# Patient Record
Sex: Male | Born: 1950 | Race: Black or African American | Hispanic: No | Marital: Married | Smoking: Former smoker
Health system: Southern US, Community
[De-identification: ages and names within clinical notes are randomized; demographics above are authoritative.]

## PROBLEM LIST (undated history)

## (undated) DIAGNOSIS — K635 Polyp of colon: Secondary | ICD-10-CM

## (undated) DIAGNOSIS — J449 Chronic obstructive pulmonary disease, unspecified: Secondary | ICD-10-CM

## (undated) DIAGNOSIS — G5600 Carpal tunnel syndrome, unspecified upper limb: Secondary | ICD-10-CM

## (undated) DIAGNOSIS — I10 Essential (primary) hypertension: Secondary | ICD-10-CM

## (undated) DIAGNOSIS — D649 Anemia, unspecified: Secondary | ICD-10-CM

## (undated) DIAGNOSIS — E785 Hyperlipidemia, unspecified: Secondary | ICD-10-CM

## (undated) DIAGNOSIS — G8929 Other chronic pain: Secondary | ICD-10-CM

## (undated) DIAGNOSIS — E119 Type 2 diabetes mellitus without complications: Secondary | ICD-10-CM

## (undated) DIAGNOSIS — M549 Dorsalgia, unspecified: Secondary | ICD-10-CM

## (undated) HISTORY — DX: Carpal tunnel syndrome, unspecified upper limb: G56.00

## (undated) HISTORY — DX: Other chronic pain: G89.29

## (undated) HISTORY — DX: Essential (primary) hypertension: I10

## (undated) HISTORY — DX: Type 2 diabetes mellitus without complications: E11.9

## (undated) HISTORY — DX: Hyperlipidemia, unspecified: E78.5

## (undated) HISTORY — DX: Anemia, unspecified: D64.9

## (undated) HISTORY — DX: Dorsalgia, unspecified: M54.9

## (undated) HISTORY — DX: Chronic obstructive pulmonary disease, unspecified: J44.9

## (undated) HISTORY — PX: ABDOMINAL HERNIA REPAIR: SHX539

---

## 1999-08-27 ENCOUNTER — Ambulatory Visit (HOSPITAL_BASED_OUTPATIENT_CLINIC_OR_DEPARTMENT_OTHER): Admission: RE | Admit: 1999-08-27 | Discharge: 1999-08-27 | Payer: Self-pay | Admitting: Dentistry

## 2000-10-21 ENCOUNTER — Encounter: Admission: RE | Admit: 2000-10-21 | Discharge: 2000-10-21 | Payer: Self-pay | Admitting: Emergency Medicine

## 2001-06-09 ENCOUNTER — Encounter: Payer: Self-pay | Admitting: Internal Medicine

## 2001-06-09 ENCOUNTER — Ambulatory Visit (HOSPITAL_COMMUNITY): Admission: RE | Admit: 2001-06-09 | Discharge: 2001-06-09 | Payer: Self-pay | Admitting: Internal Medicine

## 2002-07-29 ENCOUNTER — Encounter: Payer: Self-pay | Admitting: Emergency Medicine

## 2002-07-29 ENCOUNTER — Emergency Department (HOSPITAL_COMMUNITY): Admission: EM | Admit: 2002-07-29 | Discharge: 2002-07-29 | Payer: Self-pay | Admitting: Emergency Medicine

## 2002-09-06 ENCOUNTER — Emergency Department (HOSPITAL_COMMUNITY): Admission: EM | Admit: 2002-09-06 | Discharge: 2002-09-06 | Payer: Self-pay | Admitting: Emergency Medicine

## 2002-09-06 ENCOUNTER — Encounter: Payer: Self-pay | Admitting: Emergency Medicine

## 2006-01-05 ENCOUNTER — Ambulatory Visit (HOSPITAL_COMMUNITY): Admission: RE | Admit: 2006-01-05 | Discharge: 2006-01-05 | Payer: Self-pay | Admitting: Family Medicine

## 2006-11-28 ENCOUNTER — Ambulatory Visit (HOSPITAL_COMMUNITY): Admission: RE | Admit: 2006-11-28 | Discharge: 2006-11-28 | Payer: Self-pay | Admitting: Family Medicine

## 2007-11-17 ENCOUNTER — Encounter (HOSPITAL_COMMUNITY): Admission: RE | Admit: 2007-11-17 | Discharge: 2007-12-13 | Payer: Self-pay | Admitting: Neurosurgery

## 2009-01-10 ENCOUNTER — Ambulatory Visit: Payer: Self-pay | Admitting: Internal Medicine

## 2009-01-21 ENCOUNTER — Ambulatory Visit: Payer: Self-pay | Admitting: Internal Medicine

## 2009-01-24 ENCOUNTER — Encounter: Payer: Self-pay | Admitting: Internal Medicine

## 2009-01-24 ENCOUNTER — Ambulatory Visit: Payer: Self-pay | Admitting: Internal Medicine

## 2009-01-24 ENCOUNTER — Ambulatory Visit (HOSPITAL_COMMUNITY): Admission: RE | Admit: 2009-01-24 | Discharge: 2009-01-24 | Payer: Self-pay | Admitting: Internal Medicine

## 2009-01-24 HISTORY — PX: COLONOSCOPY: SHX174

## 2011-01-03 ENCOUNTER — Encounter: Payer: Self-pay | Admitting: Family Medicine

## 2011-03-30 LAB — GLUCOSE, CAPILLARY: Glucose-Capillary: 137 mg/dL — ABNORMAL HIGH (ref 70–99)

## 2011-04-27 NOTE — Consult Note (Signed)
NAME:  Patrick Taylor, Patrick Taylor NO.:  [id number]   MEDICAL RECORD NO.:  [id number]         PATIENT TYPE:  PAMB   LOCATION:  DAY                           FACILITY:  [facility]   PHYSICIAN:  R. Roetta Sessions, M.D. DATE OF BIRTH:  [birth year]   DATE OF CONSULTATION:  DATE OF DISCHARGE:                                 CONSULTATION   PRIMARY CARE PHYSICIAN:  Kirk Ruths, MD   CHIEF COMPLAINT:  Chronic anemia.  He needs colonoscopy.   HISTORY OF PRESENT ILLNESS:  Patrick Taylor is a 60 year old African  American male who is referred through the courtesy of Dr. Geanie Logan  office.  He tells me that he has never had a colonoscopy before he  denies any rectal bleeding or melena.  He has history of arthritis and  has been taking diclofenac for about 1 year.  He has also been on  aspirin 81 mg daily.  He denies any abdominal pain, nausea, vomiting,  heartburn, ingestion, anorexia, or early satiety.  His weight has  remained stable.  He has been on ranitidine since 1995, but denies any  heartburn, indigestion, or reflex history.   He is having a soft brown bowel movement daily.   Laboratory studies from Dr. Geanie Logan office show a white blood cell  count of 5.6, hemoglobin 12.2, hematocrit 38.9, MCV 83.5, and platelet  count 333.  He has a 18% peripheral eosinophilia.  He has a normal CMP  except for glucose of 107.   PAST MEDICAL HISTORY:  Diabetes mellitus, chronic back pain with  neuropathy.  He has carpal tunnel syndrome, chronic anemia,  hypertension, asthma, hyperlipidemia, arthritis, chronic peripheral  eosinophilia and he has had surgery on abdominal hernia repair.   CURRENT MEDICATIONS:  1. Gabapentin 300 mg in the morning and 600 mg in the evening.  2. Hydrocodone 7.5/650 mg b.i.d.  3. Ranitidine 150 mg b.i.d.  4. Aspirin 81 mg daily.  5. Enalapril 5 mg daily.  6. Furosemide 20 mg daily.  7. Diclofenac 75 mg b.i.d.  8. Simvastatin 20 mg daily.  9. Glyburide 4 mg  b.i.d.  10.ACTOplus metformin 15/850 mg b.i.d.  11.Ventolin b.i.d.  12.Symbicort b.i.d.   ALLERGIES:  No known drug allergies.   FAMILY HISTORY:  Mother deceased secondary to alcoholic cirrhosis.  Father deceased, etiology unknown.   SOCIAL HISTORY:  Patrick Taylor is married.  He has been married for 31  years.  He is employed with [employer] in [place].  He quit smoking  about 10 years ago.  He quit drinking alcohol 9 years ago.  He has never  been a heavy drinker.  He denies any drug use.   REVIEW OF SYSTEMS:  See HPI, otherwise negative.   PHYSICAL EXAMINATION:  VITAL SIGNS:  Weight 235.5 pounds, height 62  inches, temperature 98.6, blood pressure 140/80, and pulse 56.  GENERAL:  He is an obese African male who is alert, oriented, pleasant,  cooperative in no acute distress.  Today he is accompanied by his wife.  HEENT:  Sclerae clear, nonicteric. Conjunctivae pink. Oropharynx pink  and moist without any lesions.  NECK:  Supple without mass or thyromegaly.  CHEST:  Heart regular rate and rhythm.  Normal S2 and S2.  No murmurs,  clicks, rubs or gallops.  LUNGS:  Clear to auscultation bilaterally.  ABDOMEN:  Protuberant with positive bowel sounds x4.  No bruits were  auscultated.  Soft, nontender, and nondistended without palpable mass or  thyromegaly.  There is no rebound tenderness or guarding.  EXTREMITIES:  Without clubbing or edema.   IMPRESSION:  Patrick Taylor is a 60 year old African American male with  history of chronic anemia.  He has never had a colonoscopy.  He has been  on H2 blocker, but denies any history of gastroesophageal reflux  disease.  His Hemoccult status is unknown at this time.  He also has a  peripheral eosinophilia.  He is going to need to be further evaluated  for occult malignancy including colorectal carcinoma.  He is on  nonsteroidal antiinflammatory drugs, placing him at risk for peptic  ulcer disease, or bleeding throughout his gastrointestinal  tract  including small bowel arteriovenous malformations.   PLAN:  1. We will check Hemoccult status x3.  If it is positive, he will need      EGD at the time of colonoscopy, if nothing is found to account his      anemia.  2. Colonoscopy plus/minus EGD with Dr. Jena Gauss in the near future.  I      have discussed these procedures including risks and benefits which      include but not limited to bleeding, infection, perforation, or      drug reaction.  He agrees      with this plan.  The consent was obtained.  He is going to take      half of his diabetes medications a day prior to end of the      procedure.   Thank you Dr. Nobie Putnam for allowing [place] to participate in the care of [name].       Lorenza Burton, N.P.      Jonathon Bellows, M.D.  Electronically Signed    KJ/MEDQ  D:  01/10/2009  T:  01/11/2009  Job:  [id number]   cc:   Patrica Duel, M.D.  Fax: [phone]

## 2011-04-27 NOTE — Op Note (Signed)
NAME:  DEMETRES, PROCHNOW NO.:  [id number]   MEDICAL RECORD NO.:  [id number]          PATIENT TYPE:  AMB   LOCATION:  DAY                           FACILITY:  [facility]   PHYSICIAN:  R. Roetta Sessions, M.D. DATE OF BIRTH:  1951/04/13   DATE OF PROCEDURE:  01/24/2009  DATE OF DISCHARGE:                               OPERATIVE REPORT   PROCEDURE:  Colonoscopy and snare polypectomy.   INDICATIONS FOR PROCEDURE:  A 60 year old gentleman with history of  chronic anemia, Hemoccult-negative times three through our office  recently.  He is devoid of any lower GI tract symptoms.  There is no  family history of colorectal neoplasia or polyps.  He has never had his  lower GI tract evaluated.  Colonoscopy is now being done.  Risks,  benefits, alternatives and limitations have been discussed and questions  answered.  Please see the documentation in the medical record.   PROCEDURE NOTE:  Oxygen saturation, blood pressure, pulse and  respirations were monitored throughout the entire procedure.  Conscious  sedation with Versed 4 mg IV, Demerol 100 mg IV in divided doses.   INSTRUMENT:  Pentax video chip system.   FINDINGS:  Digital rectal exam revealed no abnormalities.  Prep was  adequate. Colonic mucosal survey from the rectosigmoid junction to the  left transverse, right colon, to the appendiceal orifice, ileocecal  valve and cecum.  These structures were well-seen and photographed for  the record.  From this level the scope was slowly withdrawn.  All  previously-mentioned mucosal surfaces were again seen.  The patient had  a 6-mm sessile polyp in the descending colon, which was hot-snared,  removed, recovered through the scope.  The remainder of colonic mucosa  appeared normal.  Scope was pulled out of the rectum.  Thorough  examination of the rectal mucosa, including retroflexed view of the anal  verge, demonstrated a 1-cm flat, oval polyp, 5 cm in from the anal  verge.  This was  hot-snare removed and recovered through the scope.  The  remainder of the rectal mucosa appeared unremarkable.  The patient  tolerated the procedure well.   Esophagogastroduodenoscopy was not performed as currently there is no  indication.   IMPRESSION:  1. Rectal polyp, status post snare polypectomy.  2. Mid-descending colon, status post hot-snare removal.  3. Remainder of colonic mucosa appeared normal.   RECOMMENDATIONS:  1. No aspirin or _NSAIDS_ medications for the next five days.  2. Follow up on pathology.  3. Further recommendations to follow.      Jonathon Bellows, M.D.  Electronically Signed     RMR/MEDQ  D:  01/24/2009  T:  01/24/2009  Job:  [id number]   cc:   Patrica Duel, M.D.  Fax: [phone]

## 2012-01-13 ENCOUNTER — Encounter: Payer: Self-pay | Admitting: Internal Medicine

## 2012-02-17 ENCOUNTER — Encounter: Payer: Self-pay | Admitting: Internal Medicine

## 2012-02-18 ENCOUNTER — Encounter: Payer: Self-pay | Admitting: Gastroenterology

## 2012-02-18 ENCOUNTER — Ambulatory Visit (INDEPENDENT_AMBULATORY_CARE_PROVIDER_SITE_OTHER): Payer: PRIVATE HEALTH INSURANCE | Admitting: Gastroenterology

## 2012-02-18 VITALS — BP 148/84 | HR 84 | Temp 97.5°F | Ht 62.0 in | Wt 233.4 lb

## 2012-02-18 DIAGNOSIS — D126 Benign neoplasm of colon, unspecified: Secondary | ICD-10-CM

## 2012-02-18 DIAGNOSIS — D649 Anemia, unspecified: Secondary | ICD-10-CM | POA: Insufficient documentation

## 2012-02-18 MED ORDER — PEG-KCL-NACL-NASULF-NA ASC-C 100 G PO SOLR: 1.0000 | Freq: Once | ORAL | Status: DC

## 2012-02-18 NOTE — Progress Notes (Signed)
Primary Care Physician:  MCGOUGH,Hovanes M, MD, MD  Primary Gastroenterologist:  Michael Rourk, MD   Chief Complaint  Patient presents with  . Colonoscopy    HPI:  Patrick Taylor is a 61 y.o. 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 He denies any issues with his bowels. Generally has 2 BMs daily. No blood in stool or melena. Denies any abdominal pain, unintentional weight loss, vomiting, heartburn, dysphagia. He denies any anemia. Previously at time of last colonoscopy he did have issues with anemia. At that time, January 2002, his hemoglobin was 12.2, MCV 83.5, eosinophil count 18%, reportedly chronic.  Current Outpatient Prescriptions  Medication Sig Dispense Refill  . albuterol (PROVENTIL) 2 MG tablet Take 2 mg by mouth 3 (three) times daily.      . aspirin 81 MG tablet Take 81 mg by mouth daily.      . budesonide-formoterol (SYMBICORT) 80-4.5 MCG/ACT inhaler Inhale 2 puffs into the lungs 2 (two) times daily.      . cyclobenzaprine (FLEXERIL) 10 MG tablet Take by mouth 3 (three) times daily as needed.       . enalapril (VASOTEC) 5 MG tablet Take 5 mg by mouth daily.       . furosemide (LASIX) 20 MG tablet Take 20 mg by mouth daily.       . gabapentin (NEURONTIN) 300 MG capsule Take 300 mg by mouth 3 (three) times daily. 1-2 pills tid      . HYDROcodone-acetaminophen (NORCO) 7.5-325 MG per tablet Take 1 tablet by mouth every 6 (six) hours as needed.      . metFORMIN (GLUCOPHAGE-XR) 500 MG 24 hr tablet Take 1,000 mg by mouth 2 (two) times daily.       . mometasone-formoterol (DULERA) 100-5 MCG/ACT AERO Inhale 2 puffs into the lungs 2 (two) times daily.      . nabumetone (RELAFEN) 750 MG tablet Take 750 mg by mouth 2 (two) times daily.      . NOVOLOG MIX 70/30 FLEXPEN (70-30) 100 UNIT/ML injection Inject 15 Units into the skin. 15 in am and 40 at night as needed      . simvastatin (ZOCOR) 20 MG tablet 20 mg at  bedtime.         Allergies as of 02/18/2012  . (No Known Allergies)    Past Medical History  Diagnosis Date  . DM (diabetes mellitus)   . Carpal tunnel syndrome   . Anemia   . HTN (hypertension)   . Asthma   . Hyperlipidemia   . Chronic back pain     Past Surgical History  Procedure Date  . Colonoscopy 01/24/09    rectal polyp/adenomatous poylps/tubulovillous  . Abdominal hernia repair    Family History  Problem Relation Age of Onset  . Colon cancer       History   Social History  . Marital Status: Married    Spouse Name: N/A    Number of Children: N/A  . Years of Education: N/A    Works at [employer].       Social History Main Topics  . Smoking status: Never Smoker   . Smokeless tobacco: Not on file  . Alcohol Use: No  . Drug Use: No  . Sexually Active: Not on file   Other Topics Concern  . Not on file   Social History Narrative  . No narrative on file      ROS:  General: Negative   for anorexia, weight loss, fever, chills, fatigue, weakness. Eyes: Negative for vision changes.  ENT: Negative for hoarseness, difficulty swallowing , nasal congestion. CV: Negative for chest pain, angina, palpitations, dyspnea on exertion, peripheral edema.  Respiratory: Negative for dyspnea at rest, dyspnea on exertion, cough, sputum, wheezing.  GI: See history of present illness. GU:  Negative for dysuria, hematuria, urinary incontinence, urinary frequency, nocturnal urination.  MS: Chronic knee and back pain.  Derm: Negative for rash or itching.  Neuro: Negative for weakness, abnormal sensation, seizure, frequent headaches, memory loss, confusion.  Psych: Negative for anxiety, depression, suicidal ideation, hallucinations.  Endo: Negative for unusual weight change.  Heme: Negative for bruising or bleeding. Allergy: Negative for rash or hives.    Physical Examination:  BP 148/84  Pulse 84  Temp(Src) 97.5 F (36.4 C) (Temporal)  Ht 5' 2" (1.575 m)  Wt 233 lb  6.4 oz (105.87 kg)  BMI 42.69 kg/m2   General: Well-nourished, well-developed in no acute distress. Obese. Head: Normocephalic, atraumatic.   Eyes: Conjunctiva pink, no icterus. Mouth: Oropharyngeal mucosa moist and pink , no lesions erythema or exudate. Neck: Supple without thyromegaly, masses, or lymphadenopathy.  Lungs: Clear to auscultation bilaterally.  Heart: Regular rate and rhythm, no murmurs rubs or gallops.  Abdomen: Bowel sounds are normal, nontender, nondistended, no hepatosplenomegaly or masses, no abdominal bruits or    hernia , no rebound or guarding.   Rectal: Defer to time of colonoscopy. Extremities: No lower extremity edema. No clubbing or deformities.  Neuro: Alert and oriented x 4 , grossly normal neurologically.  Skin: Warm and dry, no rash or jaundice.   Psych: Alert and cooperative, normal mood and affect.     

## 2012-02-18 NOTE — Assessment & Plan Note (Signed)
H/O anemia. Retrieve latest labs from PCP.

## 2012-02-18 NOTE — Assessment & Plan Note (Signed)
Due for surveillance colonoscopy for history of advanced polyp.  I have discussed the risks, alternatives, benefits with regards to but not limited to the risk of reaction to medication, bleeding, infection, perforation and the patient is agreeable to proceed. Written consent to be obtained.

## 2012-02-18 NOTE — Progress Notes (Signed)
Faxed to PCP

## 2012-02-21 NOTE — Progress Notes (Signed)
Please let patient know, the last labs available from Dr. Edison Simon office 12/2009.  Consider CBC if patient agreeable.

## 2012-02-23 ENCOUNTER — Other Ambulatory Visit: Payer: Self-pay | Admitting: Gastroenterology

## 2012-02-23 DIAGNOSIS — D649 Anemia, unspecified: Secondary | ICD-10-CM

## 2012-02-23 NOTE — Progress Notes (Signed)
Tried to call pt- number disconnected. Letter mailed

## 2012-02-26 ENCOUNTER — Other Ambulatory Visit: Payer: Self-pay | Admitting: Gastroenterology

## 2012-02-27 LAB — CBC WITH DIFFERENTIAL/PLATELET
Basophils Absolute: 0 10*3/uL (ref 0.0–0.1)
Eosinophils Relative: 8 % — ABNORMAL HIGH (ref 0–5)
Lymphocytes Relative: 41 % (ref 12–46)
Lymphs Abs: 2.4 10*3/uL (ref 0.7–4.0)
Neutro Abs: 2.4 10*3/uL (ref 1.7–7.7)
Neutrophils Relative %: 42 % — ABNORMAL LOW (ref 43–77)
Platelets: 324 10*3/uL (ref 150–400)
RBC: 4.92 MIL/uL (ref 4.22–5.81)
RDW: 14 % (ref 11.5–15.5)
WBC: 5.8 10*3/uL (ref 4.0–10.5)

## 2012-02-29 NOTE — Progress Notes (Signed)
Quick Note:  CBC now with low normal Hgb. EOS chronically elevated but much better. TCS as planned. ______

## 2012-02-29 NOTE — Progress Notes (Signed)
Quick Note:  Tried to call pt- NA and voicemail has not been set up yet. ______

## 2012-03-01 MED ORDER — SODIUM CHLORIDE 0.45 % IV SOLN
Freq: Once | INTRAVENOUS | Status: AC
  Administered 2012-03-02: 10:00:00 via INTRAVENOUS

## 2012-03-02 ENCOUNTER — Ambulatory Visit (HOSPITAL_COMMUNITY)
Admission: RE | Admit: 2012-03-02 | Discharge: 2012-03-02 | Disposition: A | Discharge status: Home Health | Payer: PRIVATE HEALTH INSURANCE | Source: Ambulatory Visit | Attending: Internal Medicine | Admitting: Internal Medicine

## 2012-03-02 ENCOUNTER — Encounter (HOSPITAL_COMMUNITY): Payer: Self-pay | Admitting: *Deleted

## 2012-03-02 ENCOUNTER — Encounter (HOSPITAL_COMMUNITY)
Admission: RE | Disposition: A | Discharge status: Home Health | Payer: Self-pay | Source: Ambulatory Visit | Attending: Internal Medicine

## 2012-03-02 DIAGNOSIS — Z794 Long term (current) use of insulin: Secondary | ICD-10-CM | POA: Insufficient documentation

## 2012-03-02 DIAGNOSIS — K5730 Diverticulosis of large intestine without perforation or abscess without bleeding: Secondary | ICD-10-CM

## 2012-03-02 DIAGNOSIS — D126 Benign neoplasm of colon, unspecified: Secondary | ICD-10-CM | POA: Insufficient documentation

## 2012-03-02 DIAGNOSIS — E785 Hyperlipidemia, unspecified: Secondary | ICD-10-CM | POA: Insufficient documentation

## 2012-03-02 DIAGNOSIS — Z860100 Personal history of colon polyps, unspecified: Secondary | ICD-10-CM | POA: Insufficient documentation

## 2012-03-02 DIAGNOSIS — E119 Type 2 diabetes mellitus without complications: Secondary | ICD-10-CM | POA: Insufficient documentation

## 2012-03-02 DIAGNOSIS — D649 Anemia, unspecified: Secondary | ICD-10-CM

## 2012-03-02 DIAGNOSIS — I10 Essential (primary) hypertension: Secondary | ICD-10-CM | POA: Insufficient documentation

## 2012-03-02 DIAGNOSIS — Z01812 Encounter for preprocedural laboratory examination: Secondary | ICD-10-CM | POA: Insufficient documentation

## 2012-03-02 DIAGNOSIS — Z1211 Encounter for screening for malignant neoplasm of colon: Secondary | ICD-10-CM

## 2012-03-02 DIAGNOSIS — Z79899 Other long term (current) drug therapy: Secondary | ICD-10-CM | POA: Insufficient documentation

## 2012-03-02 HISTORY — DX: Polyp of colon: K63.5

## 2012-03-02 HISTORY — PX: COLONOSCOPY: SHX5424

## 2012-03-02 LAB — GLUCOSE, CAPILLARY

## 2012-03-02 SURGERY — COLONOSCOPY
Anesthesia: Moderate Sedation

## 2012-03-02 MED ORDER — MIDAZOLAM HCL 5 MG/5ML IJ SOLN
INTRAMUSCULAR | Status: DC | PRN
  Administered 2012-03-02 (×2): 2 mg via INTRAVENOUS

## 2012-03-02 MED ORDER — MIDAZOLAM HCL 5 MG/5ML IJ SOLN
INTRAMUSCULAR | Status: AC
  Filled 2012-03-02: qty 10

## 2012-03-02 MED ORDER — STERILE WATER FOR IRRIGATION IR SOLN
Status: DC | PRN
  Administered 2012-03-02: 11:00:00

## 2012-03-02 MED ORDER — MEPERIDINE HCL 100 MG/ML IJ SOLN
INTRAMUSCULAR | Status: AC
  Filled 2012-03-02: qty 2

## 2012-03-02 MED ORDER — MEPERIDINE HCL 100 MG/ML IJ SOLN
INTRAMUSCULAR | Status: DC | PRN
  Administered 2012-03-02: 50 mg via INTRAVENOUS

## 2012-03-02 NOTE — Discharge Instructions (Addendum)
Colonoscopy Discharge Instructions  Read the instructions outlined below and refer to this sheet in the next few weeks. These discharge instructions provide you with general information on caring for yourself after you leave the hospital. Your doctor may also give you specific instructions. While your treatment has been planned according to the most current medical practices available, unavoidable complications occasionally occur. If you have any problems or questions after discharge, call Dr. Jena Gauss at [phone]. ACTIVITY  You may resume your regular activity, but move at a slower pace for the next 24 hours.   Take frequent rest periods for the next 24 hours.   Walking will help get rid of the air and reduce the bloated feeling in your belly (abdomen).   No driving for 24 hours (because of the medicine (anesthesia) used during the test).    Do not sign any important legal documents or operate any machinery for 24 hours (because of the anesthesia used during the test).  NUTRITION  Drink plenty of fluids.   You may resume your normal diet as instructed by your doctor.   Begin with a light meal and progress to your normal diet. Heavy or fried foods are harder to digest and may make you feel sick to your stomach (nauseated).   Avoid alcoholic beverages for 24 hours or as instructed.  MEDICATIONS  You may resume your normal medications unless your doctor tells you otherwise.  WHAT YOU CAN EXPECT TODAY  Some feelings of bloating in the abdomen.   Passage of more gas than usual.   Spotting of blood in your stool or on the toilet paper.  IF YOU HAD POLYPS REMOVED DURING THE COLONOSCOPY:  No aspirin products for 7 days or as instructed.   No alcohol for 7 days or as instructed.   Eat a soft diet for the next 24 hours.  FINDING OUT THE RESULTS OF YOUR TEST Not all test results are available during your visit. If your test results are not back during the visit, make an appointment  with your caregiver to find out the results. Do not assume everything is normal if you have not heard from your caregiver or the medical facility. It is important for you to follow up on all of your test results.  SEEK IMMEDIATE MEDICAL ATTENTION IF:  You have more than a spotting of blood in your stool.   Your belly is swollen (abdominal distention).   You are nauseated or vomiting.   You have a temperature over 101.   You have abdominal pain or discomfort that is severe or gets worse throughout the day.     Polyp and diverticulosis information provided.     Further recommendations to follow pending the review of pathology report.Colon Polyps A polyp is extra tissue that grows inside your body. Colon polyps grow in the large intestine. The large intestine, also called the colon, is part of your digestive system. It is a long, hollow tube at the end of your digestive tract where your body makes and stores stool. Most polyps are not dangerous. They are benign. This means they are not cancerous. But over time, some types of polyps can turn into cancer. Polyps that are smaller than a pea are usually not harmful. But larger polyps could someday become or may already be cancerous. To be safe, doctors remove all polyps and test them.  WHO GETS POLYPS? Anyone can get polyps, but certain people are more likely than others. You may have a greater chance  of getting polyps if:  You are over 50.   You have had polyps before.   Someone in your family has had polyps.   Someone in your family has had cancer of the large intestine.   Find out if someone in your family has had polyps. You may also be more likely to get polyps if you:   Eat a lot of fatty foods.   Smoke.   Drink alcohol.   Do not exercise.   Eat too much.  SYMPTOMS  Most small polyps do not cause symptoms. People often do not know they have one until their caregiver finds it during a regular checkup or while testing them  for something else. Some people do have symptoms like these:  Bleeding from the anus. You might notice blood on your underwear or on toilet paper after you have had a bowel movement.   Constipation or diarrhea that lasts more than a week.   Blood in the stool. Blood can make stool look black or it can show up as red streaks in the stool.  If you have any of these symptoms, see your caregiver. HOW DOES THE DOCTOR TEST FOR POLYPS? The doctor can use four tests to check for polyps:  Digital rectal exam. The caregiver wears gloves and checks your rectum (the last part of the large intestine) to see if it feels normal. This test would find polyps only in the rectum. Your caregiver may need to do one of the other tests listed below to find polyps higher up in the intestine.   Barium enema. The caregiver puts a liquid called barium into your rectum before taking x-rays of your large intestine. Barium makes your intestine look white in the pictures. Polyps are dark, so they are easy to see.   Sigmoidoscopy. With this test, the caregiver can see inside your large intestine. A thin flexible tube is placed into your rectum. The device is called a sigmoidoscope, which has a light and a tiny video camera in it. The caregiver uses the sigmoidoscope to look at the last third of your large intestine.   Colonoscopy. This test is like sigmoidoscopy, but the caregiver looks at all of the large intestine. It usually requires sedation. This is the most common method for finding and removing polyps.  TREATMENT   The caregiver will remove the polyp during sigmoidoscopy or colonoscopy. The polyp is then tested for cancer.   If you have had polyps, your caregiver may want you to get tested regularly in the future.  PREVENTION  There is not one sure way to prevent polyps. You might be able to lower your risk of getting them if you:  Eat more fruits and vegetables and less fatty food.   Do not smoke.   Avoid  alcohol.   Exercise every day.   Lose weight if you are overweight.   Eating more calcium and folate can also lower your risk of getting polyps. Some foods that are rich in calcium are milk, cheese, and broccoli. Some foods that are rich in folate are chickpeas, kidney beans, and spinach.   Aspirin might help prevent polyps. Studies are under way.  Document Released: 08/25/2004 Document Revised: 11/18/2011 Document Reviewed: 01/31/2008 [facility] Patient Information 2012 [place], [place]. Diverticulosis Diverticulosis is a common condition that develops when small pouches (diverticula) form in the wall of the colon. The risk of diverticulosis increases with age. It happens more often in people who eat a low-fiber diet. Most individuals with  Those individuals with symptoms usually experience abdominal pain, constipation, or loose stools (diarrhea). HOME CARE INSTRUCTIONS   Increase the amount of fiber in your diet as directed by your caregiver or dietician. This may reduce symptoms of diverticulosis.   Your caregiver may recommend taking a dietary fiber supplement.   Drink at least 6 to 8 glasses of water each day to prevent constipation.   Try not to strain when you have a bowel movement.   Your caregiver may recommend avoiding nuts and seeds to prevent complications, although this is still an uncertain benefit.   Only take over-the-counter or prescription medicines for pain, discomfort, or fever as directed by your caregiver.  FOODS WITH HIGH FIBER CONTENT INCLUDE:  Fruits. Apple, peach, pear, tangerine, raisins, prunes.   Vegetables. Brussels sprouts, asparagus, broccoli, cabbage, carrot, cauliflower, romaine lettuce, spinach, summer squash, tomato, winter squash, zucchini.   Starchy Vegetables. Baked beans, kidney beans, lima beans, split peas, lentils, potatoes (with skin).   Grains. Whole wheat bread, brown rice, bran flake cereal, plain oatmeal,  white rice, shredded wheat, bran muffins.  SEEK IMMEDIATE MEDICAL CARE IF:   You develop increasing pain or severe bloating.   You have an oral temperature above 102 F (38.9 C), not controlled by medicine.   You develop vomiting or bowel movements that are bloody or black.  Document Released: 08/26/2004 Document Revised: 11/18/2011 Document Reviewed: 04/29/2010 [employer] Patient Information 2012 [employer]. 

## 2012-03-02 NOTE — H&P (View-Only) (Signed)
Faxed to PCP

## 2012-03-02 NOTE — H&P (View-Only) (Signed)
Primary Care Physician:  Kirk Ruths, MD, MD  Primary Gastroenterologist:  Roetta Sessions, MD   Chief Complaint  Patient presents with  . Colonoscopy    HPI:  Patrick Taylor is a 61 y.o. 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 He denies any issues with his bowels. Generally has 2 BMs daily. No blood in stool or melena. Denies any abdominal pain, unintentional weight loss, vomiting, heartburn, dysphagia. He denies any anemia. Previously at time of last colonoscopy he did have issues with anemia. At that time, January 2002, his hemoglobin was 12.2, MCV 83.5, eosinophil count 18%, reportedly chronic.  Current Outpatient Prescriptions  Medication Sig Dispense Refill  . albuterol (PROVENTIL) 2 MG tablet Take 2 mg by mouth 3 (three) times daily.      [name] Kitchen aspirin 81 MG tablet Take 81 mg by mouth daily.      . budesonide-formoterol (SYMBICORT) 80-4.5 MCG/ACT inhaler Inhale 2 puffs into the lungs 2 (two) times daily.      . cyclobenzaprine (FLEXERIL) 10 MG tablet Take by mouth 3 (three) times daily as needed.       . enalapril (VASOTEC) 5 MG tablet Take 5 mg by mouth daily.       . furosemide (LASIX) 20 MG tablet Take 20 mg by mouth daily.       [name] Kitchen gabapentin (NEURONTIN) 300 MG capsule Take 300 mg by mouth 3 (three) times daily. 1-2 pills tid      . HYDROcodone-acetaminophen (NORCO) 7.5-325 MG per tablet Take 1 tablet by mouth every 6 (six) hours as needed.      . metFORMIN (GLUCOPHAGE-XR) 500 MG 24 hr tablet Take 1,000 mg by mouth 2 (two) times daily.       . mometasone-formoterol (DULERA) 100-5 MCG/ACT AERO Inhale 2 puffs into the lungs 2 (two) times daily.      . nabumetone (RELAFEN) 750 MG tablet Take 750 mg by mouth 2 (two) times daily.      [name] Kitchen NOVOLOG MIX 70/30 FLEXPEN (70-30) 100 UNIT/ML injection Inject 15 Units into the skin. 15 in am and 40 at night as needed      . simvastatin (ZOCOR) 20 MG tablet 20 mg at  bedtime.         Allergies as of 02/18/2012  . (No Known Allergies)    Past Medical History  Diagnosis Date  . DM (diabetes mellitus)   . Carpal tunnel syndrome   . Anemia   . HTN (hypertension)   . Asthma   . Hyperlipidemia   . Chronic back pain     Past Surgical History  Procedure Date  . Colonoscopy 01/24/09    rectal polyp/adenomatous poylps/tubulovillous  . Abdominal hernia repair    Family History  Problem Relation Age of Onset  . Colon cancer       History   Social History  . Marital Status: Married    Spouse Name: N/A    Number of Children: N/A  . Years of Education: N/A    Works at [employer].       Social History Main Topics  . Smoking status: Never Smoker   . Smokeless tobacco: Not on file  . Alcohol Use: No  . Drug Use: No  . Sexually Active: Not on file   Other Topics Concern  . Not on file   Social History Narrative  . No narrative on file      ROS:  General: Negative  for anorexia, weight loss, fever, chills, fatigue, weakness. Eyes: Negative for vision changes.  ENT: Negative for hoarseness, difficulty swallowing , nasal congestion. CV: Negative for chest pain, angina, palpitations, dyspnea on exertion, peripheral edema.  Respiratory: Negative for dyspnea at rest, dyspnea on exertion, cough, sputum, wheezing.  GI: See history of present illness. GU:  Negative for dysuria, hematuria, urinary incontinence, urinary frequency, nocturnal urination.  MS: Chronic knee and back pain.  Derm: Negative for rash or itching.  Neuro: Negative for weakness, abnormal sensation, seizure, frequent headaches, memory loss, confusion.  Psych: Negative for anxiety, depression, suicidal ideation, hallucinations.  Endo: Negative for unusual weight change.  Heme: Negative for bruising or bleeding. Allergy: Negative for rash or hives.    Physical Examination:  BP 148/84  Pulse 84  Temp(Src) 97.5 F (36.4 C) (Temporal)  Ht 5\' 2"  (1.575 m)  Wt 233 lb  6.4 oz (105.87 kg)  BMI 42.69 kg/m2   General: Well-nourished, well-developed in no acute distress. Obese. Head: Normocephalic, atraumatic.   Eyes: Conjunctiva pink, no icterus. Mouth: Oropharyngeal mucosa moist and pink , no lesions erythema or exudate. Neck: Supple without thyromegaly, masses, or lymphadenopathy.  Lungs: Clear to auscultation bilaterally.  Heart: Regular rate and rhythm, no murmurs rubs or gallops.  Abdomen: Bowel sounds are normal, nontender, nondistended, no hepatosplenomegaly or masses, no abdominal bruits or    hernia , no rebound or guarding.   Rectal: Defer to time of colonoscopy. Extremities: No lower extremity edema. No clubbing or deformities.  Neuro: Alert and oriented x 4 , grossly normal neurologically.  Skin: Warm and dry, no rash or jaundice.   Psych: Alert and cooperative, normal mood and affect.

## 2012-03-02 NOTE — OR Nursing (Signed)
Patient did not take blood pressure medication before procedure. Instructed to take blood pressure medications when arriving home. Made an appointment with Christiane Ha, PA at [facility] for Friday, March 22 at 3:40PM per Dr. Luvenia Starch recommendation.

## 2012-03-02 NOTE — Interval H&P Note (Signed)
History and Physical Interval Note:  03/02/2012 11:03 AM  [name]  has presented today for surgery, with the diagnosis of hx of polyps  The various methods of treatment have been discussed with the patient and family. After consideration of risks, benefits and other options for treatment, the patient has consented to  Procedure(s) (LRB): COLONOSCOPY (N/A) as a surgical intervention .  The patients' history has been reviewed, patient examined, no change in status, stable for surgery.  I have reviewed the patients' chart and labs.  Questions were answered to the patient's satisfaction.     Patrick Taylor

## 2012-03-02 NOTE — Op Note (Signed)
[facility] [address] [place], [place]  [address]  COLONOSCOPY PROCEDURE REPORT  PATIENT:  Patrick Taylor, Patrick Taylor  MR#:  [id number] BIRTHDATE:  [birth year], 61 yrs. old  GENDER:  male ENDOSCOPIST:  R. Roetta Sessions, MD FACP [facility] REF. BY:  Karleen Hampshire, M.D. PROCEDURE DATE:  03/02/2012 PROCEDURE:  Colonoscopy with snare polypectomy and biopsy  INDICATIONS:  history of tubulovillous adenoma  INFORMED CONSENT:  The risks, benefits, alternatives and imponderables including but not limited to bleeding, perforation as well as the possibility of a missed lesion have been reviewed. The potential for biopsy, lesion removal, etc. have also been discussed.  Questions have been answered.  All parties agreeable. Please see the history and physical in the medical record for more information.  MEDICATIONS:  Versed 5 mg IV and Demerol 50 mg IV in divided doses.  DESCRIPTION OF PROCEDURE:  After a digital rectal exam was performed, the EC-[id number] ([id number]) colonoscope was advanced from the anus through the rectum and colon to the area of the cecum, ileocecal valve and appendiceal orifice.  The cecum was deeply intubated.  These structures were well-seen and photographed for the record.  From the level of the cecum and ileocecal valve, the scope was slowly and cautiously withdrawn.  The mucosal surfaces were carefully surveyed utilizing scope tip deflection to facilitate fold flattening as needed.  The scope was pulled down into the rectum where a thorough examination including retroflexion was performed. <<PROCEDUREIMAGES>>  FINDINGS:  poor prep which compromised examination. Normal rectum. Left-sided transverse diverticula; a single 5 mm polyp in the mid sigmoid colon and a diminutive polyp was noted at the splenic flexure; otherwise, the remainder of the colonic mucosa appeared normal, however, the poor prep made it difficult to see all mucosal surfaces well. A smaller or flat  lesion may have been obscured and not seen because of the relatively poor prep.  THERAPEUTIC / DIAGNOSTIC MANEUVERS PERFORMED:   the above-mentioned polyps were cold snared and cold biopsy removed, respectively.  COMPLICATIONS:  none  CECAL WITHDRAWAL TIME:   12 minutes  IMPRESSION:  Colonic polyps - status post removal as described above and  diverticulosis  RECOMMENDATIONS:  Follow up on pathology  ______________________________ R. Roetta Sessions, MD Caleen Essex  CC:  Karleen Hampshire, M.D.  n. eSIGNED:   R. Roetta Sessions at 03/02/2012 11:40 AM  Florestine Avers, [phone]

## 2012-03-02 NOTE — Interval H&P Note (Signed)
History and Physical Interval Note:  03/02/2012 11:03 AM  Patrick Taylor  has presented today for surgery, with the diagnosis of hx of polyps  The various methods of treatment have been discussed with the patient and family. After consideration of risks, benefits and other options for treatment, the patient has consented to  Procedure(s) (LRB): COLONOSCOPY (N/A) as a surgical intervention .  The patients' history has been reviewed, patient examined, no change in status, stable for surgery.  I have reviewed the patients' chart and labs.  Questions were answered to the patient's satisfaction.     Mataio Mele   

## 2012-03-05 ENCOUNTER — Encounter: Payer: Self-pay | Admitting: Internal Medicine

## 2012-03-06 ENCOUNTER — Encounter (HOSPITAL_COMMUNITY): Payer: Self-pay | Admitting: Internal Medicine

## 2012-03-06 NOTE — Progress Notes (Signed)
Quick Note:  Tried to call pt- voicemail has not been set up yet. ______

## 2012-03-07 NOTE — Progress Notes (Signed)
Quick Note:  Letter mailed to pt ______ 

## 2013-04-02 ENCOUNTER — Encounter (HOSPITAL_COMMUNITY): Payer: Self-pay | Admitting: *Deleted

## 2013-04-02 ENCOUNTER — Emergency Department (HOSPITAL_COMMUNITY)
Admission: EM | Admit: 2013-04-02 | Discharge: 2013-04-02 | Disposition: A | Discharge status: Home / Self Care | Payer: PRIVATE HEALTH INSURANCE | Attending: Emergency Medicine | Admitting: Emergency Medicine

## 2013-04-02 DIAGNOSIS — Z87891 Personal history of nicotine dependence: Secondary | ICD-10-CM | POA: Insufficient documentation

## 2013-04-02 DIAGNOSIS — Z79899 Other long term (current) drug therapy: Secondary | ICD-10-CM | POA: Insufficient documentation

## 2013-04-02 DIAGNOSIS — J45909 Unspecified asthma, uncomplicated: Secondary | ICD-10-CM | POA: Insufficient documentation

## 2013-04-02 DIAGNOSIS — I10 Essential (primary) hypertension: Secondary | ICD-10-CM | POA: Insufficient documentation

## 2013-04-02 DIAGNOSIS — Z8669 Personal history of other diseases of the nervous system and sense organs: Secondary | ICD-10-CM | POA: Insufficient documentation

## 2013-04-02 DIAGNOSIS — G8929 Other chronic pain: Secondary | ICD-10-CM

## 2013-04-02 DIAGNOSIS — Z860100 Personal history of colon polyps, unspecified: Secondary | ICD-10-CM | POA: Insufficient documentation

## 2013-04-02 DIAGNOSIS — E785 Hyperlipidemia, unspecified: Secondary | ICD-10-CM | POA: Insufficient documentation

## 2013-04-02 DIAGNOSIS — M545 Low back pain, unspecified: Secondary | ICD-10-CM | POA: Insufficient documentation

## 2013-04-02 DIAGNOSIS — E119 Type 2 diabetes mellitus without complications: Secondary | ICD-10-CM | POA: Insufficient documentation

## 2013-04-02 DIAGNOSIS — Z862 Personal history of diseases of the blood and blood-forming organs and certain disorders involving the immune mechanism: Secondary | ICD-10-CM | POA: Insufficient documentation

## 2013-04-02 DIAGNOSIS — Z7982 Long term (current) use of aspirin: Secondary | ICD-10-CM | POA: Insufficient documentation

## 2013-04-02 MED ORDER — OXYCODONE-ACETAMINOPHEN 5-325 MG PO TABS
2.0000 | ORAL_TABLET | Freq: Once | ORAL | Status: AC
  Administered 2013-04-02: 2 via ORAL
  Filled 2013-04-02: qty 2

## 2013-04-02 NOTE — ED Provider Notes (Signed)
History     CSN: [id number]  Arrival date & time 04/02/13  1120   None     Chief Complaint  Patient presents with  . Back Pain    (Consider location/radiation/quality/duration/timing/severity/associated sxs/prior treatment) Patient is a 62 y.o. male presenting with back pain. The history is provided by the patient.  Back Pain Location:  Lumbar spine Quality:  Shooting Radiates to:  L posterior upper leg Pain severity:  Severe Pain is:  Worse during the night Onset quality:  Gradual Duration:  2 months Timing:  Constant Progression:  Worsening Chronicity:  Chronic Relieved by:  Nothing Worsened by:  Ambulation Ineffective treatments:  Narcotics Associated symptoms: no abdominal pain, no chest pain and no headaches    Patrick Taylor is a 62 y.o. male who presents to the ED with low back pain. The pain has been chronic since 2008 when he fell at work and injured his back. His doctor has him on pain medication but over the past 2 months the pain has gotten worse. He has been evaluated by Dr. Claybon Jabs in [place] and is scheduled to go in for a cortisone injection next week. He sees Dr. Regino Schultze for regular health care and chronic pain. He denies loss of control of bladder or bowels. He called out of work today and they told him he needed to come in so he told them he was coming to the ED for treatment of his back pain.   Past Medical History  Diagnosis Date  . DM (diabetes mellitus)   . Carpal tunnel syndrome   . Anemia   . HTN (hypertension)   . Asthma   . Hyperlipidemia   . Chronic back pain   . Colon polyps     Past Surgical History  Procedure Laterality Date  . Colonoscopy  01/24/09    rectal polyp/adenomatous poylps/tubulovillous  . Abdominal hernia repair    . Colonoscopy  03/02/2012    Procedure: COLONOSCOPY;  Surgeon: Corbin Ade, MD;  Location: [facility];  Service: Endoscopy;  Laterality: N/A;  11:00    Family History  Problem Relation Age of Onset    . Colon cancer      History  Substance Use Topics  . Smoking status: Former Smoker    Quit date: 02/18/1999  . Smokeless tobacco: Not on file  . Alcohol Use: No     Comment: no heavy use in past      Review of Systems  Constitutional: Negative for activity change.  HENT: Negative for neck pain.   Respiratory: Negative for chest tightness.   Cardiovascular: Negative for chest pain.  Gastrointestinal: Negative for nausea, vomiting and abdominal pain.  Musculoskeletal: Positive for back pain.  Skin: Negative for rash.  Neurological: Negative for headaches.  Psychiatric/Behavioral: Negative for confusion. The patient is not nervous/anxious.     Allergies  Review of patient's allergies indicates no known allergies.  Home Medications   Current Outpatient Rx  Name  Route  Sig  Dispense  Refill  . albuterol (PROVENTIL) 2 MG tablet   Oral   Take 2 mg by mouth 3 (three) times daily.         [name] Kitchen aspirin 81 MG tablet   Oral   Take 81 mg by mouth daily.         . budesonide-formoterol (SYMBICORT) 80-4.5 MCG/ACT inhaler   Inhalation   Inhale 2 puffs into the lungs 2 (two) times daily.         [name] Kitchen  cyclobenzaprine (FLEXERIL) 10 MG tablet   Oral   Take by mouth 3 (three) times daily as needed.          . enalapril (VASOTEC) 5 MG tablet   Oral   Take 5 mg by mouth daily.          . furosemide (LASIX) 20 MG tablet   Oral   Take 20 mg by mouth daily.          [name] Kitchen gabapentin (NEURONTIN) 300 MG capsule   Oral   Take 300 mg by mouth 3 (three) times daily. 1-2 pills tid         . HYDROcodone-acetaminophen (NORCO) 7.5-325 MG per tablet   Oral   Take 1 tablet by mouth every 6 (six) hours as needed. For pain         . metFORMIN (GLUCOPHAGE-XR) 500 MG 24 hr tablet   Oral   Take 1,000 mg by mouth 2 (two) times daily.          . mometasone-formoterol (DULERA) 100-5 MCG/ACT AERO   Inhalation   Inhale 2 puffs into the lungs 2 (two) times daily.         .  nabumetone (RELAFEN) 750 MG tablet   Oral   Take 750 mg by mouth 2 (two) times daily.         [name] Kitchen NOVOLOG MIX 70/30 FLEXPEN (70-30) 100 UNIT/ML injection   Subcutaneous   Inject 15 Units into the skin. 15 in am and 40 at night as needed         . peg 3350 powder (MOVIPREP) 100 G SOLR   Oral   Take 1 kit (100 g total) by mouth once. As directed Please purchase 1 Fleets enema to use with the prep   1 kit   0   . simvastatin (ZOCOR) 20 MG tablet      20 mg at bedtime.            BP 161/79  Pulse 76  Temp(Src) 98 F (36.7 C) (Oral)  Resp 20  Ht 5\' 2"  (1.575 m)  Wt 213 lb (96.616 kg)  BMI 38.95 kg/m2  SpO2 100%  Physical Exam  Nursing note and vitals reviewed. Constitutional: He is oriented to person, place, and time. No distress.  Obese   HENT:  Head: Normocephalic.  Eyes: EOM are normal.  Neck: Neck supple.  Cardiovascular: Normal rate, regular rhythm and normal heart sounds.   Pulmonary/Chest: Effort normal and breath sounds normal. No respiratory distress.  Abdominal: Soft. Bowel sounds are normal. There is no tenderness.  Musculoskeletal: He exhibits no edema.       Lumbar back: He exhibits decreased range of motion and tenderness. He exhibits no deformity, no laceration, no spasm and normal pulse.       Back:  Pain over right sciatic nerve. Pain with range of motion of back.   Neurological: He is alert and oriented to person, place, and time. He has normal strength and normal reflexes. No cranial nerve deficit or sensory deficit.  Pedal pulses equal bilateral. Adequate circulation, good touch sensation.  Skin: Skin is warm and dry.  Psychiatric: He has a normal mood and affect. His behavior is normal. Judgment and thought content normal.   Assessment: 62 y.o. male with chronic back pain  Plan:  Percocet here   Follow up with PCP or Neurologist as scheduled   Patient has pain medication and muscle relaxants. ED Course  Procedures (including critical care  time) Patient request work note for the rest of the week and for light duty. I explained that we will give note for work missed today and he will need to follow up with his doctor for work restrictions and long term work release.   MDM  I have reviewed this patient's vital signs, nurses notes and discussed clinical findings and plan of care with the patient and he voices understanding.          Janne Napoleon, [place] 04/02/13 1447

## 2013-04-02 NOTE — ED Notes (Signed)
Hx of chronic back pain, worsening over past 2 months with new numbness to left fingers.  Denies new injury.

## 2013-04-03 NOTE — ED Provider Notes (Signed)
Medical screening examination/treatment/procedure(s) were performed by non-physician practitioner and as supervising physician I was immediately available for consultation/collaboration.    Lorece Keach L Trystin Hargrove, MD 04/03/13 0710 

## 2014-12-08 ENCOUNTER — Emergency Department (HOSPITAL_COMMUNITY)
Admission: EM | Admit: 2014-12-08 | Discharge: 2014-12-08 | Disposition: A | Discharge status: Home / Self Care | Payer: BC Managed Care – PPO | Attending: Emergency Medicine | Admitting: Emergency Medicine

## 2014-12-08 ENCOUNTER — Encounter (HOSPITAL_COMMUNITY): Payer: Self-pay

## 2014-12-08 DIAGNOSIS — J45909 Unspecified asthma, uncomplicated: Secondary | ICD-10-CM | POA: Insufficient documentation

## 2014-12-08 DIAGNOSIS — Z862 Personal history of diseases of the blood and blood-forming organs and certain disorders involving the immune mechanism: Secondary | ICD-10-CM | POA: Insufficient documentation

## 2014-12-08 DIAGNOSIS — L259 Unspecified contact dermatitis, unspecified cause: Secondary | ICD-10-CM

## 2014-12-08 DIAGNOSIS — G8929 Other chronic pain: Secondary | ICD-10-CM | POA: Insufficient documentation

## 2014-12-08 DIAGNOSIS — M545 Low back pain, unspecified: Secondary | ICD-10-CM

## 2014-12-08 DIAGNOSIS — I10 Essential (primary) hypertension: Secondary | ICD-10-CM | POA: Diagnosis not present

## 2014-12-08 DIAGNOSIS — Z87891 Personal history of nicotine dependence: Secondary | ICD-10-CM | POA: Insufficient documentation

## 2014-12-08 DIAGNOSIS — E119 Type 2 diabetes mellitus without complications: Secondary | ICD-10-CM | POA: Insufficient documentation

## 2014-12-08 DIAGNOSIS — Z7982 Long term (current) use of aspirin: Secondary | ICD-10-CM | POA: Diagnosis not present

## 2014-12-08 DIAGNOSIS — Z86010 Personal history of colonic polyps: Secondary | ICD-10-CM | POA: Insufficient documentation

## 2014-12-08 LAB — CBG MONITORING, ED
Glucose-Capillary: 439 mg/dL — ABNORMAL HIGH (ref 70–99)
Glucose-Capillary: 394 mg/dL — ABNORMAL HIGH (ref 70–99)

## 2014-12-08 MED ORDER — OXYCODONE-ACETAMINOPHEN 5-325 MG PO TABS
1.0000 | ORAL_TABLET | Freq: Once | ORAL | Status: AC
  Administered 2014-12-08: 1 via ORAL
  Filled 2014-12-08: qty 1

## 2014-12-08 MED ORDER — INSULIN ASPART 100 UNIT/ML ~~LOC~~ SOLN
SUBCUTANEOUS | Status: AC
  Filled 2014-12-08: qty 1

## 2014-12-08 MED ORDER — HYDROCODONE-ACETAMINOPHEN 5-325 MG PO TABS: 1.0000 | ORAL_TABLET | ORAL | Status: DC | PRN

## 2014-12-08 MED ORDER — INSULIN ASPART PROT & ASPART (70-30 MIX) 100 UNIT/ML ~~LOC~~ SUSP
15.0000 [IU] | Freq: Once | SUBCUTANEOUS | Status: AC
  Administered 2014-12-08: 15 [IU] via SUBCUTANEOUS
  Filled 2014-12-08: qty 10

## 2014-12-08 NOTE — Discharge Instructions (Signed)
Take Benadryl for the rash and itching on your arms. Take the pain medication as directed. Do not drive while taking the medication because it will make you sleepy. Follow up with your doctor tomorrow as scheduled for all your medication refills.

## 2014-12-08 NOTE — ED Notes (Signed)
Pt reports has problems with lower back pain and it flared up this morning.  Also reports rash on arms and hands x 1 week.  C/o itching.

## 2014-12-08 NOTE — ED Notes (Signed)
Patient ambulated with steady gait.

## 2014-12-08 NOTE — ED Provider Notes (Signed)
CSN: [id number]     Arrival date & time 12/08/14  7989 History   First MD Initiated Contact with Patient 12/08/14 [phone]     Chief Complaint  Patient presents with  . Back Pain  . Rash     (Consider location/radiation/quality/duration/timing/severity/associated sxs/prior Treatment) Patient is a 63 y.o. male presenting with back pain and rash. The history is provided by the patient.  Back Pain Location:  Lumbar spine Quality:  Aching Pain severity:  Severe Onset quality:  Gradual Duration:  5 hours Timing:  Constant Progression:  Worsening Rash Location:  Shoulder/arm Shoulder/arm rash location:  L forearm, R forearm, R hand and L hand Quality: dryness and itchiness   Onset quality:  Gradual Duration:  1 week Timing:  Constant Progression:  Unchanged Chronicity:  New Relieved by:  None tried Worsened by:  Heat Ineffective treatments:  None tried  Patrick Taylor is a 63 y.o. male who presents to the ED with low back pain that stated this morning. He states she has had back problems since an injury in 2008 and has been on disability since then. He is a patient of Dr. Gerarda Fraction. Patient ran out of his medications 2 months ago. He does have some Metformin left but none of the others. He has an appointment with his diabetic doctor in [place] tomorrow but he is here today for his back pain and the rash on his forearms he developed a week ago.   Past Medical History  Diagnosis Date  . DM (diabetes mellitus)   . Carpal tunnel syndrome   . Anemia   . HTN (hypertension)   . Asthma   . Hyperlipidemia   . Chronic back pain   . Colon polyps    Past Surgical History  Procedure Laterality Date  . Colonoscopy  01/24/09    rectal polyp/adenomatous poylps/tubulovillous  . Abdominal hernia repair    . Colonoscopy  03/02/2012    Procedure: COLONOSCOPY;  Surgeon: Daneil Dolin, MD;  Location: [facility];  Service: Endoscopy;  Laterality: N/A;  11:00   Family History  Problem  Relation Age of Onset  . Colon cancer     History  Substance Use Topics  . Smoking status: Former Smoker    Quit date: 02/18/1999  . Smokeless tobacco: Not on file  . Alcohol Use: No     Comment: no heavy use in past    Review of Systems  Musculoskeletal: Positive for back pain.  Skin: Positive for rash.  all other systems negative    Allergies  Review of patient's allergies indicates no known allergies.  Home Medications   Prior to Admission medications   Medication Sig Start Date End Date Taking? Authorizing Provider  aspirin 81 MG tablet Take 81 mg by mouth daily.   Yes Historical Provider, MD  budesonide-formoterol (SYMBICORT) 80-4.5 MCG/ACT inhaler Inhale 2 puffs into the lungs 2 (two) times daily.   Yes Historical Provider, MD  cyclobenzaprine (FLEXERIL) 10 MG tablet Take 10 mg by mouth 3 (three) times daily as needed for muscle spasms.  02/18/12  Yes Historical Provider, MD  enalapril (VASOTEC) 5 MG tablet Take 5 mg by mouth daily.  12/30/11  Yes Historical Provider, MD  furosemide (LASIX) 20 MG tablet Take 20 mg by mouth daily.  01/22/12  Yes Historical Provider, MD  gabapentin (NEURONTIN) 300 MG capsule Take 300 mg by mouth 3 (three) times daily.    Yes Historical Provider, MD  metFORMIN (GLUCOPHAGE-XR) 500 MG 24 hr tablet  Take 500 mg by mouth every evening.  01/04/12  Yes Historical Provider, MD  nabumetone (RELAFEN) 750 MG tablet Take 750 mg by mouth 2 (two) times daily.   Yes Historical Provider, MD  NOVOLOG MIX 70/30 FLEXPEN (70-30) 100 UNIT/ML injection Inject 50-65 Units into the skin 2 (two) times daily. 65 units in the morning and 50 units in the evening. 01/24/12  Yes Historical Provider, MD  pravastatin (PRAVACHOL) 20 MG tablet Take 20 mg by mouth daily.   Yes Historical Provider, MD  HYDROcodone-acetaminophen (NORCO/VICODIN) 5-325 MG per tablet Take 1 tablet by mouth every 4 (four) hours as needed. 12/08/14   Hope Bunnie Pion, NP  peg 3350 powder (MOVIPREP) 100 G SOLR  Take 1 kit (100 g total) by mouth once. As directed Please purchase 1 Fleets enema to use with the prep Patient not taking: Reported on 12/08/2014 02/18/12   Daneil Dolin, MD   BP 143/78 mmHg  Pulse 71  Temp(Src) 97.9 F (36.6 C) (Oral)  Resp 16  SpO2 100% Physical Exam  Constitutional: He is oriented to person, place, and time. He appears well-developed and well-nourished. No distress.  HENT:  Head: Normocephalic and atraumatic.  Eyes: EOM are normal. Pupils are equal, round, and reactive to light.  Neck: Normal range of motion. Neck supple.  Cardiovascular: Normal rate and regular rhythm.   Pulmonary/Chest: Effort normal. No respiratory distress. He has no wheezes. He has no rales.  Abdominal: Soft. Bowel sounds are normal. There is no tenderness.  Musculoskeletal: Normal range of motion. He exhibits no edema.       Lumbar back: He exhibits tenderness. He exhibits normal range of motion, no deformity, no spasm and normal pulse.  Neurological: He is alert and oriented to person, place, and time. He has normal strength. No cranial nerve deficit or sensory deficit. Coordination and gait normal.  Reflex Scores:      Bicep reflexes are 2+ on the right side and 2+ on the left side.      Brachioradialis reflexes are 2+ on the right side and 2+ on the left side.      Patellar reflexes are 2+ on the right side and 2+ on the left side.      Achilles reflexes are 2+ on the right side and 2+ on the left side. Skin: Skin is warm and dry.  Psychiatric: He has a normal mood and affect. His behavior is normal.  Nursing note and vitals reviewed.   ED Course  Procedures (including critical care time) Discussed with Dr. Christy Gentles. Will treat the patient's pain and give Novolog 70/30 morning dose and he will see his doctor in the AM as scheduled. He will continue his Metformin  Labs Review Labs Reviewed  CBG MONITORING, ED - Abnormal; Notable for the following:    Glucose-Capillary 439 (*)    All  other components within normal limits  CBG MONITORING, ED - Abnormal; Notable for the following:    Glucose-Capillary 394 (*)    All other components within normal limits  CBG MONITORING, ED    MDM  63 y.o. male with hx of chronic low back pain with flare up this morning. Also need for insulin injection until he sees his doctor tomorrow. After pain medication and injection of insulin patient feeling better and blood sugar has dropped some. Stable for d/c without problems at this time. Discussed with the patient and all questioned fully answered. He will return if any problems arise.   Final diagnoses:  Chronic low back pain  Contact dermatitis     Ashley Murrain, NP 12/08/14 [place], MD 12/08/14 1740

## 2015-02-13 ENCOUNTER — Encounter: Payer: Self-pay | Admitting: Internal Medicine

## 2015-04-12 ENCOUNTER — Encounter (HOSPITAL_COMMUNITY): Payer: Self-pay | Admitting: *Deleted

## 2015-04-12 ENCOUNTER — Emergency Department (HOSPITAL_COMMUNITY)
Admission: EM | Admit: 2015-04-12 | Discharge: 2015-04-12 | Disposition: A | Discharge status: Home / Self Care | Payer: 59 | Attending: Emergency Medicine | Admitting: Emergency Medicine

## 2015-04-12 DIAGNOSIS — Z86010 Personal history of colonic polyps: Secondary | ICD-10-CM | POA: Diagnosis not present

## 2015-04-12 DIAGNOSIS — G8929 Other chronic pain: Secondary | ICD-10-CM | POA: Diagnosis not present

## 2015-04-12 DIAGNOSIS — L03211 Cellulitis of face: Secondary | ICD-10-CM | POA: Insufficient documentation

## 2015-04-12 DIAGNOSIS — Z87891 Personal history of nicotine dependence: Secondary | ICD-10-CM | POA: Insufficient documentation

## 2015-04-12 DIAGNOSIS — L03213 Periorbital cellulitis: Secondary | ICD-10-CM

## 2015-04-12 DIAGNOSIS — J45909 Unspecified asthma, uncomplicated: Secondary | ICD-10-CM | POA: Diagnosis not present

## 2015-04-12 DIAGNOSIS — Z862 Personal history of diseases of the blood and blood-forming organs and certain disorders involving the immune mechanism: Secondary | ICD-10-CM | POA: Insufficient documentation

## 2015-04-12 DIAGNOSIS — Z79899 Other long term (current) drug therapy: Secondary | ICD-10-CM | POA: Diagnosis not present

## 2015-04-12 DIAGNOSIS — E119 Type 2 diabetes mellitus without complications: Secondary | ICD-10-CM | POA: Diagnosis not present

## 2015-04-12 DIAGNOSIS — E785 Hyperlipidemia, unspecified: Secondary | ICD-10-CM | POA: Insufficient documentation

## 2015-04-12 DIAGNOSIS — H5712 Ocular pain, left eye: Secondary | ICD-10-CM | POA: Diagnosis present

## 2015-04-12 DIAGNOSIS — Z7982 Long term (current) use of aspirin: Secondary | ICD-10-CM | POA: Insufficient documentation

## 2015-04-12 DIAGNOSIS — I10 Essential (primary) hypertension: Secondary | ICD-10-CM | POA: Insufficient documentation

## 2015-04-12 DIAGNOSIS — Z792 Long term (current) use of antibiotics: Secondary | ICD-10-CM | POA: Insufficient documentation

## 2015-04-12 MED ORDER — FLUORESCEIN SODIUM 1 MG OP STRP
1.0000 | ORAL_STRIP | Freq: Once | OPHTHALMIC | Status: DC
  Filled 2015-04-12: qty 1

## 2015-04-12 MED ORDER — CLINDAMYCIN HCL 300 MG PO CAPS: 300.0000 mg | ORAL_CAPSULE | Freq: Three times a day (TID) | ORAL | Status: DC

## 2015-04-12 MED ORDER — OFLOXACIN 0.3 % OP SOLN: 1.0000 [drp] | Freq: Four times a day (QID) | OPHTHALMIC | Status: DC

## 2015-04-12 MED ORDER — TETRACAINE HCL 0.5 % OP SOLN
2.0000 [drp] | Freq: Once | OPHTHALMIC | Status: DC
  Filled 2015-04-12: qty 2

## 2015-04-12 NOTE — ED Provider Notes (Signed)
CSN: [id number]     Arrival date & time 04/12/15  8366 History  This chart was scribed for No att. providers found by Erling Conte, ED Scribe. This patient was seen in room APA17/APA17 and the patient's care was started at 7:10 AM.    Chief Complaint  Patient presents with  . Eye Pain    The history is provided by the patient. No language interpreter was used.    HPI Comments: Patrick Taylor is a 64 y.o. male with a h/o DM, HTN, asthma, hyperlipidemia and chronic back pain who presents to the Emergency Department complaining of left eye pain for 1 week. Pt states his right eye pain began 2 weeks ago and the progressed to his left eye. He is having associated blurred vision in left eye, eye redness and eye itchiness. He denies any h/o eye issues in the past. He states he he has a h/o seasonal allergies. He notes that it feels like something may be stuck in his eye. He states he does not have an eye doctor that he regularly follows up with. Pt does not wear contact lenses. He denies any other issues at this time  Past Medical History  Diagnosis Date  . DM (diabetes mellitus)   . Carpal tunnel syndrome   . Anemia   . HTN (hypertension)   . Asthma   . Hyperlipidemia   . Chronic back pain   . Colon polyps    Past Surgical History  Procedure Laterality Date  . Colonoscopy  01/24/09    rectal polyp/adenomatous poylps/tubulovillous  . Abdominal hernia repair    . Colonoscopy  03/02/2012    Procedure: COLONOSCOPY;  Surgeon: Daneil Dolin, MD;  [facility]: [facility];  Service: Endoscopy;  Laterality: N/A;  11:00   Family History  Problem Relation Age of Onset  . Colon cancer     History  Substance Use Topics  . Smoking status: Former Smoker    Quit date: 02/18/1999  . Smokeless tobacco: Not on file  . Alcohol Use: No     Comment: no heavy use in past    Review of Systems  Constitutional: Negative for fever and chills.  Eyes: Positive for photophobia, pain, discharge,  redness, itching and visual disturbance.      Allergies  Review of patient's allergies indicates no known allergies.  Home Medications   Prior to Admission medications   Medication Sig Start Date End Date Taking? Authorizing Provider  acetaminophen (TYLENOL) 500 MG tablet Take 500 mg by mouth 2 (two) times daily as needed for mild pain or moderate pain.   Yes Historical Provider, MD  aspirin 81 MG tablet Take 81 mg by mouth daily.   Yes Historical Provider, MD  budesonide-formoterol (SYMBICORT) 80-4.5 MCG/ACT inhaler Inhale 2 puffs into the lungs 2 (two) times daily.   Yes Historical Provider, MD  cyclobenzaprine (FLEXERIL) 10 MG tablet Take 10 mg by mouth 2 (two) times daily.  02/18/12  Yes Historical Provider, MD  enalapril (VASOTEC) 5 MG tablet Take 5 mg by mouth daily.  12/30/11  Yes Historical Provider, MD  furosemide (LASIX) 20 MG tablet Take 20 mg by mouth daily.  01/22/12  Yes Historical Provider, MD  gabapentin (NEURONTIN) 300 MG capsule Take 300 mg by mouth 3 (three) times daily.    Yes Historical Provider, MD  HYDROcodone-acetaminophen (NORCO/VICODIN) 5-325 MG per tablet Take 1 tablet by mouth every 4 (four) hours as needed. Patient taking differently: Take 1 tablet by mouth every  3 (three) hours as needed for moderate pain.  12/08/14  Yes Hope Bunnie Pion, NP  metFORMIN (GLUCOPHAGE-XR) 500 MG 24 hr tablet Take 500 mg by mouth every evening.  01/04/12  Yes Historical Provider, MD  nabumetone (RELAFEN) 750 MG tablet Take 750 mg by mouth 2 (two) times daily.   Yes Historical Provider, MD  NOVOLOG MIX 70/30 FLEXPEN (70-30) 100 UNIT/ML injection Inject 15-50 Units into the skin 2 (two) times daily. 15 units in the morning and 50 units in the evening. 01/24/12  Yes Historical Provider, MD  pravastatin (PRAVACHOL) 20 MG tablet Take 20 mg by mouth daily.   Yes Historical Provider, MD  clindamycin (CLEOCIN) 300 MG capsule Take 1 capsule (300 mg total) by mouth 3 (three) times daily. 04/12/15   Varney Biles, MD  ofloxacin (OCUFLOX) 0.3 % ophthalmic solution Place 1 drop into the left eye 4 (four) times daily. 04/12/15   Varney Biles, MD  peg 3350 powder (MOVIPREP) 100 G SOLR Take 1 kit (100 g total) by mouth once. As directed Please purchase 1 Fleets enema to use with the prep Patient not taking: Reported on 12/08/2014 02/18/12   Daneil Dolin, MD   Triage Vitals: BP 158/83 mmHg  Pulse 94  Temp(Src) 98.4 F (36.9 C) (Oral)  Ht '5\' 2"'  (1.575 m)  Wt 205 lb (92.987 kg)  BMI 37.49 kg/m2  SpO2 100%  Physical Exam  Constitutional: He is oriented to person, place, and time. He appears well-developed and well-nourished. No distress.  HENT:  Head: Normocephalic and atraumatic.  Eyes: Conjunctivae and EOM are normal.  Pupils 3 mm and equal. Right eye there is mild subconjuctival hemorrhage and inferior eye lid has a sty like lesion with surrounding erythema and edema. Positive tearing. There is some exudative discharge. Negative consensual photophobia.  Neck: Neck supple. No tracheal deviation present.  Cardiovascular: Normal rate.   Pulmonary/Chest: Effort normal. No respiratory distress.  Musculoskeletal: Normal range of motion.  Neurological: He is alert and oriented to person, place, and time.  Skin: Skin is warm and dry.  Psychiatric: He has a normal mood and affect. His behavior is normal.  Nursing note and vitals reviewed.   ED Course  Procedures (including critical care time)  DIAGNOSTIC STUDIES: Oxygen Saturation is 100% on RA, normal by my interpretation.    COORDINATION OF CARE:    Labs Review Labs Reviewed - No data to display  Imaging Review No results found.   EKG Interpretation None      MDM   Final diagnoses:  Preseptal cellulitis of left eye    I personally performed the services described in this documentation, which was scribed in my presence. The recorded information has been reviewed and is accurate.  Pt comes in with cc of eye pain.  Pt  has what appears to be a stye, and resultant preseptal cellulitis. No consensual photophobia, vision complains, eye pain. Will give oral and topical antibiotics, and request pcp f/u, and optho f/u if vision worsens or symptoms get worse.  Varney Biles, MD 04/15/15 [id number]

## 2015-04-12 NOTE — ED Notes (Signed)
Right eye 20/50     Left eye 20/50    Both eyes  20/40

## 2015-04-12 NOTE — Discharge Instructions (Signed)
You appear to be having cellulitis around the eye. TAKE THE ANTIBIOTICS PRESCRIBED.  On Monday, if you are not doing better, call the EYE specialist and set up an appointment. If you are getting worse (increased pain, vision loss, inability to move eyes), come to the ER.  If getting better, call your primary doctor and see them.   Periorbital Cellulitis Periorbital cellulitis is a common infection that can affect the eyelid and the soft tissues that surround the eyeball. The infection may also affect the structures that produce and drain tears. It does not affect the eyeball itself. Natural tissue barriers usually prevent the spread of this infection to the eyeball and other deeper areas of the eye socket.  CAUSES  Bacterial infection.  Long-term (chronic) sinus infections.  An object (foreign body) stuck behind the eye.  An injury that goes through the eyelid tissues.  An injury that causes an infection, such as an insect sting.  Fracture of the bone around the eye.  Infections which have spread from the eyelid or other structures around the eye.  Bite wounds.  Inflammation or infection of the lining membranes of the brain (meningitis).  An infection in the blood (septicemia).  Dental infection (abscess).  Viral infection (this is rare). SYMPTOMS Symptoms usually come on suddenly.  Pain in the eye.  Red, hot, and swollen eyelids and possibly cheeks. The swelling is sometimes bad enough that the eyelids cannot open. Some infections make the eyelids look purple.  Fever and feeling generally ill.  Pain when touching the area around the eye. DIAGNOSIS  Periorbital cellulitis can be diagnosed from an eye exam. In severe cases, your caregiver might suggest:  Blood tests.  Imaging tests (such as a CT scan) to examine the sinuses and the area around and behind the eyeball. TREATMENT If your caregiver feels that you do not have any signs of serious infection, treatment  may include:  Antibiotics.  Nasal decongestants to reduce swelling.  Referral to a dentist if it is suspected that the infection was caused by a prior tooth infection.  Examination every day to make sure the problem is improving. HOME CARE INSTRUCTIONS  Take your antibiotics as directed. Finish them even if you start to feel better.  Some pain is normal with this condition. Take pain medicine as directed by your caregiver. Only take pain medicines approved by your caregiver.  It is important to drink fluids. Drink enough water and fluids to keep your urine clear or pale yellow.  Do not smoke.  Rest and get plenty of sleep.  Mild or moderate fevers generally have no long-term effects and often do not require treatment.  If your caregiver has given you a follow-up appointment, it is very important to keep that appointment. Your caregiver will need to make sure that the infection is getting better. It is important to check that a more serious infection is not developing. SEEK IMMEDIATE MEDICAL CARE IF:  Your eyelids become more painful, red, warm, or swollen.  You develop double vision or your vision becomes blurred or worsens in any way.  You have trouble moving your eyes.  The eye looks like it is popping out (proptosis).  You develop a severe headache, severe neck pain, or neck stiffness.  You develop repeated vomiting.  You have a fever or persistent symptoms for more than 72 hours.  You have a fever and your symptoms suddenly get worse. MAKE SURE YOU:  Understand these instructions.  Will watch your condition.  Will get help right away if you are not doing well or get worse. Document Released: 01/01/2011 Document Revised: 02/21/2012 Document Reviewed: 01/01/2011 [facility] Patient Information 2015 [place], [place]. This information is not intended to replace advice given to you by your health care provider. Make sure you discuss any questions you have with your health  care provider.  Blepharitis Blepharitis is redness, soreness, and swelling (inflammation) of one or both eyelids. It may be caused by an allergic reaction or a bacterial infection. Blepharitis may also be associated with reddened, scaly skin (seborrhea) of the scalp and eyebrows. While you sleep, eye discharge may cause your eyelashes to stick together. Your eyelids may itch, burn, swell, and may lose their lashes. These will grow back. Your eyes may become sensitive. Blepharitis may recur and need repeated treatment. If this is the case, you may require further evaluation by an eye specialist (ophthalmologist). HOME CARE INSTRUCTIONS   Keep your hands clean.  Use a clean towel each time you dry your eyelids. Do not use this towel to clean other areas. Do not share a towel or makeup with anyone.  Wash your eyelids with warm water or warm water mixed with a small amount of baby shampoo. Do this twice a day or as often as needed.  Wash your face and eyebrows at least once a day.  Use warm compresses 2 times a day for 10 minutes at a time, or as directed by your caregiver.  Apply antibiotic ointment as directed by your caregiver.  Avoid rubbing your eyes.  Avoid wearing makeup until you get better.  Follow up with your caregiver as directed. SEEK IMMEDIATE MEDICAL CARE IF:   You have pain, redness, or swelling that gets worse or spreads to other parts of your face.  Your vision changes, or you have pain when looking at lights or moving objects.  You have a fever.  Your symptoms continue for longer than 2 to 4 days or become worse. MAKE SURE YOU:   Understand these instructions.  Will watch your condition.  Will get help right away if you are not doing well or get worse. Document Released: 11/26/2000 Document Revised: 02/21/2012 Document Reviewed: 01/06/2011 [facility] Patient Information 2015 [place], [place]. This information is not intended to replace advice given to you by your  health care provider. Make sure you discuss any questions you have with your health care provider.

## 2015-04-12 NOTE — ED Notes (Signed)
Pt had what he calls a "sty" in his right eye 2 weeks ago. Pt did not go to the doctor for this. Pt states his eye waters and has discharge. Pt states the same symptoms started in his left eye this past week. Pt is alert and oriented with NAD noted.

## 2015-09-05 LAB — HEMOGLOBIN A1C: Hgb A1c MFr Bld: 9.3 % — AB (ref 4.0–6.0)

## 2015-09-05 LAB — TSH: TSH: 2.8 u[IU]/mL (ref <=5.90)

## 2015-09-23 ENCOUNTER — Encounter: Payer: Self-pay | Admitting: "Endocrinology

## 2015-09-23 ENCOUNTER — Ambulatory Visit (INDEPENDENT_AMBULATORY_CARE_PROVIDER_SITE_OTHER): Payer: 59 | Admitting: "Endocrinology

## 2015-09-23 VITALS — BP 163/88 | HR 84 | Ht 64.0 in | Wt 218.0 lb

## 2015-09-23 DIAGNOSIS — Z794 Long term (current) use of insulin: Secondary | ICD-10-CM | POA: Diagnosis not present

## 2015-09-23 DIAGNOSIS — I10 Essential (primary) hypertension: Secondary | ICD-10-CM

## 2015-09-23 DIAGNOSIS — E782 Mixed hyperlipidemia: Secondary | ICD-10-CM | POA: Insufficient documentation

## 2015-09-23 DIAGNOSIS — E785 Hyperlipidemia, unspecified: Secondary | ICD-10-CM

## 2015-09-23 DIAGNOSIS — E1165 Type 2 diabetes mellitus with hyperglycemia: Secondary | ICD-10-CM

## 2015-09-23 DIAGNOSIS — E559 Vitamin D deficiency, unspecified: Secondary | ICD-10-CM

## 2015-09-23 DIAGNOSIS — IMO0001 Reserved for inherently not codable concepts without codable children: Secondary | ICD-10-CM | POA: Insufficient documentation

## 2015-09-23 MED ORDER — VITAMIN D (ERGOCALCIFEROL) 1.25 MG (50000 UNIT) PO CAPS: 50000.0000 [IU] | ORAL_CAPSULE | ORAL | Status: DC

## 2015-09-23 MED ORDER — GLUCOSE BLOOD VI STRP: ORAL_STRIP | Status: DC

## 2015-09-23 MED ORDER — ENALAPRIL MALEATE 5 MG PO TABS: 5.0000 mg | ORAL_TABLET | Freq: Every day | ORAL | Status: DC

## 2015-09-23 NOTE — Patient Instructions (Signed)

## 2015-09-23 NOTE — Progress Notes (Signed)
Subjective:    Patient ID: Patrick Taylor, male    DOB: December 16, 1950,    Past Medical History  Diagnosis Date  . DM (diabetes mellitus) ([place])   . Carpal tunnel syndrome   . Anemia   . HTN (hypertension)   . Asthma   . Hyperlipidemia   . Chronic back pain   . Colon polyps    Past Surgical History  Procedure Laterality Date  . Colonoscopy  01/24/09    rectal polyp/adenomatous poylps/tubulovillous  . Abdominal hernia repair    . Colonoscopy  03/02/2012    Procedure: COLONOSCOPY;  Surgeon: Daneil Dolin, MD;  Location: [facility];  Service: Endoscopy;  Laterality: N/A;  11:00   Social History   Social History  . Marital Status: Married    Spouse Name: N/A  . Number of Children: N/A  . Years of Education: N/A   Occupational History  . works at [employer]  . Smoking status: Former Smoker    Quit date: 02/18/1999  . Smokeless tobacco: None  . Alcohol Use: No     Comment: no heavy use in past  . Drug Use: No  . Sexual Activity: Not Asked   Other Topics Concern  . None   Social History Narrative   Outpatient Encounter Prescriptions as of 09/23/2015  Medication Sig  . aspirin 81 MG tablet Take 81 mg by mouth daily.  . cyclobenzaprine (FLEXERIL) 10 MG tablet Take 10 mg by mouth 2 (two) times daily.   . furosemide (LASIX) 20 MG tablet Take 20 mg by mouth daily.   [name] Kitchen gabapentin (NEURONTIN) 300 MG capsule Take 300 mg by mouth 3 (three) times daily.   . Insulin Lispro Prot & Lispro (HUMALOG MIX 75/25 KWIKPEN) (75-25) 100 UNIT/ML Kwikpen Inject 15 Units into the skin 2 (two) times daily.  . metFORMIN (GLUCOPHAGE) 1000 MG tablet Take 1,000 mg by mouth 2 (two) times daily with a meal.  . pravastatin (PRAVACHOL) 20 MG tablet Take 20 mg by mouth daily.  [name] Kitchen acetaminophen (TYLENOL) 500 MG tablet Take 500 mg by mouth 2 (two) times daily as needed for mild pain or moderate pain.  . budesonide-formoterol (SYMBICORT) 80-4.5 MCG/ACT inhaler Inhale 2  puffs into the lungs 2 (two) times daily.  . clindamycin (CLEOCIN) 300 MG capsule Take 1 capsule (300 mg total) by mouth 3 (three) times daily. (Patient not taking: Reported on 09/23/2015)  . enalapril (VASOTEC) 5 MG tablet Take 1 tablet (5 mg total) by mouth daily.  [name] Kitchen glucose blood (ONE TOUCH TEST STRIPS) test strip Use as instructed  . HYDROcodone-acetaminophen (NORCO/VICODIN) 5-325 MG per tablet Take 1 tablet by mouth every 4 (four) hours as needed. (Patient not taking: Reported on 09/23/2015)  . nabumetone (RELAFEN) 750 MG tablet Take 750 mg by mouth 2 (two) times daily.  [name] Kitchen ofloxacin (OCUFLOX) 0.3 % ophthalmic solution Place 1 drop into the left eye 4 (four) times daily. (Patient not taking: Reported on 09/23/2015)  . peg 3350 powder (MOVIPREP) 100 G SOLR Take 1 kit (100 g total) by mouth once. As directed Please purchase 1 Fleets enema to use with the prep (Patient not taking: Reported on 12/08/2014)  . Vitamin D, Ergocalciferol, (DRISDOL) 50000 UNITS CAPS capsule Take 1 capsule (50,000 Units total) by mouth every 7 (seven) days.  . [DISCONTINUED] enalapril (VASOTEC) 5 MG tablet Take 5 mg by mouth daily.   . [DISCONTINUED] metFORMIN (GLUCOPHAGE-XR) 500 MG 24 hr tablet Take  500 mg by mouth every evening.   . [DISCONTINUED] NOVOLOG MIX 70/30 FLEXPEN (70-30) 100 UNIT/ML injection Inject 15-50 Units into the skin 2 (two) times daily. 15 units in the morning and 50 units in the evening.   No facility-administered encounter medications on file as of 09/23/2015.   ALLERGIES: No Known Allergies VACCINATION STATUS:  There is no immunization history on file for this patient.  Diabetes He presents for his follow-up diabetic visit. He has type 2 diabetes mellitus. Onset time: He was diagnosed at approximate age of 55 years. His disease course has been worsening. There are no hypoglycemic associated symptoms. Pertinent negatives for hypoglycemia include no confusion, headaches, pallor or seizures.  Associated symptoms include polydipsia and polyuria. Pertinent negatives for diabetes include no chest pain, no fatigue, no polyphagia and no weakness. There are no hypoglycemic complications. Symptoms are worsening. Risk factors for coronary artery disease include dyslipidemia, diabetes mellitus, hypertension and sedentary lifestyle. Current diabetic treatment includes oral agent (monotherapy). He is compliant with treatment most of the time. He participates in exercise intermittently. His overall blood glucose range is >200 mg/dl. An ACE inhibitor/angiotensin II receptor blocker is being taken. Eye exam is current.  Hyperlipidemia This is a chronic problem. The current episode started more than 1 year ago. Pertinent negatives include no chest pain, myalgias or shortness of breath. Current antihyperlipidemic treatment includes statins.  Hypertension This is a chronic problem. The current episode started more than 1 year ago. Pertinent negatives include no chest pain, headaches, neck pain, palpitations or shortness of breath. Past treatments include ACE inhibitors.     Review of Systems  Constitutional: Negative for fatigue and unexpected weight change.  HENT: Negative for dental problem, mouth sores and trouble swallowing.   Eyes: Negative for visual disturbance.  Respiratory: Negative for cough, choking, chest tightness, shortness of breath and wheezing.   Cardiovascular: Negative for chest pain, palpitations and leg swelling.  Gastrointestinal: Negative for nausea, vomiting, abdominal pain, diarrhea, constipation and abdominal distention.  Endocrine: Positive for polydipsia and polyuria. Negative for polyphagia.  Genitourinary: Negative for dysuria, urgency, hematuria and flank pain.  Musculoskeletal: Negative for myalgias, back pain, gait problem and neck pain.  Skin: Negative for pallor, rash and wound.  Neurological: Negative for seizures, syncope, weakness, numbness and headaches.   Psychiatric/Behavioral: Negative.  Negative for confusion and dysphoric mood.    Objective:    BP 163/88 mmHg  Pulse 84  Ht '5\' 4"'  (1.626 m)  Wt 218 lb (98.884 kg)  BMI 37.40 kg/m2  SpO2 99%  Wt Readings from Last 3 Encounters:  09/23/15 218 lb (98.884 kg)  04/12/15 205 lb (92.987 kg)  04/02/13 213 lb (96.616 kg)    Physical Exam  Constitutional: He is oriented to person, place, and time. He appears well-developed and well-nourished. He is cooperative. No distress.  HENT:  Head: Normocephalic and atraumatic.  Eyes: EOM are normal.  Neck: Normal range of motion. Neck supple. No tracheal deviation present. No thyromegaly present.  Cardiovascular: Normal rate, S1 normal, S2 normal and normal heart sounds.  Exam reveals no gallop.   No murmur heard. Pulses:      Dorsalis pedis pulses are 1+ on the right side, and 1+ on the left side.       Posterior tibial pulses are 1+ on the right side, and 1+ on the left side.  Pulmonary/Chest: Breath sounds normal. No respiratory distress. He has no wheezes.  Abdominal: Soft. Bowel sounds are normal. He exhibits no distension. There  is no tenderness. There is no guarding and no CVA tenderness.  Musculoskeletal: He exhibits no edema.       Right shoulder: He exhibits no swelling and no deformity.  Neurological: He is alert and oriented to person, place, and time. He has normal strength and normal reflexes. No cranial nerve deficit or sensory deficit. Gait normal.  Skin: Skin is warm and dry. No rash noted. No cyanosis. Nails show no clubbing.  Psychiatric: He has a normal mood and affect. His speech is normal and behavior is normal. Judgment and thought content normal. Cognition and memory are normal.    Results for orders placed or performed in visit on 09/23/15  Hemoglobin A1c  Result Value Ref Range   Hgb A1c MFr Bld 9.3 (A) 4.0 - 6.0 %  TSH  Result Value Ref Range   TSH 2.80 .41 - 5.90 uIU/mL   Complete Blood Count (Most recent): Lab  Results  Component Value Date   WBC 5.8 02/26/2012   HGB 13.0 02/26/2012   HCT 42.1 02/26/2012   MCV 85.6 02/26/2012   PLT 324 02/26/2012   Chemistry (most recent): No results found for: NA, K, CL, CO2, BUN, CREATININE, GLUF Diabetic Labs (most recent): Lab Results  Component Value Date   HGBA1C 9.3* 09/05/2015   Lipid profile (most recent): No results found for: TRIG, CHOL       Assessment & Plan:   1. Uncontrolled type 2 diabetes mellitus without complication, with long-term current use of insulin ([place])  Patient came with persistently elevated glucose profile, and  recent A1c of 9.3 %.  Glucose logs and insulin administration records pertaining to this visit,  to be scanned into patient's records.  Recent labs reviewed. - Patient remains at a high risk for more acute and chronic complications of diabetes which include CAD, CVA, CKD, retinopathy, and neuropathy. These are all discussed in detail with the patient.  - I have re-counseled the patient on diet management and weight loss  by adopting a carbohydrate restricted / protein rich  Diet. - Patient is advised to stick to a routine mealtimes to eat 3 meals  a day and avoid unnecessary snacks ( to snack only to correct hypoglycemia).  - Suggestion is made for patient to avoid simple carbohydrates   from their diet including Cakes , Desserts, Ice Cream,  Soda (  diet and regular) , Sweet Tea , Candies,  Chips, Cookies, Artificial Sweeteners,   and "Sugar-free" Products .  This will help patient to have stable blood glucose profile and potentially avoid unintended  Weight gain.  - The patient  has been  scheduled with Jearld Fenton, RDN, CDE for individualized DM education. - I have approached patient with the following individualized plan to manage diabetes and patient agrees.  -I will increase his Humalog 75/25 to 20 units BID for premeal glucose of 90 or above. -He will continue  strict monitoring of glucose AC and HS.   -Patient is warned not to take insulin without proper monitoring per orders.  -Patient is encouraged to call clinic for blood glucose levels less than 70 or above 300 mg /dl.  -For better insulin sensitivity I will continue MTF at 1047m po BID.  - Patient will be considered for incretin therapy as appropriate next visit. - Patient specific target  for A1c; LDL, HDL, Triglycerides, and  Waist Circumference were discussed in detail.  2) BP/HTN: Controlled. Continue current medications including ACEI. 3) Lipids/HPL: continue statins. 4)  Weight/Diet: CDE  consult in progress, exercise, and carbohydrates information provided.  5) Chronic Care/Health Maintenance:  -Patient is on ACEI and Statin medications and encouraged to continue to follow up with Ophthalmology, Podiatrist at least yearly or according to recommendations, and advised to stay away from smoking. I have recommended yearly flu vaccine and pneumonia vaccination at least every 5 years; moderate intensity exercise for up to 150 minutes weekly; and  sleep for at least 7 hours a day.  4. Vitamin D deficiency I will initiate ergoCalciferol 50,000 units weekly for the next 12 weeks.  I advised patient to maintain close follow up with their PCP for primary care needs.  Patient is asked to bring meter and  blood glucose logs during their next visit.   Follow up plan: Return in about 3 months (around 12/24/2015) for diabetes, high blood pressure, high cholesterol, follow up with pre-visit labs, meter, and logs.  Glade Lloyd, MD Phone: [phone]  Fax: [phone]   09/23/2015, 11:34 AM

## 2015-09-24 ENCOUNTER — Other Ambulatory Visit: Payer: Self-pay

## 2015-09-24 DIAGNOSIS — E1165 Type 2 diabetes mellitus with hyperglycemia: Secondary | ICD-10-CM

## 2015-09-24 DIAGNOSIS — E1169 Type 2 diabetes mellitus with other specified complication: Principal | ICD-10-CM

## 2015-09-24 DIAGNOSIS — IMO0002 Reserved for concepts with insufficient information to code with codable children: Secondary | ICD-10-CM

## 2015-09-24 MED ORDER — GLUCOSE BLOOD VI STRP: ORAL_STRIP | Status: DC

## 2015-12-18 LAB — CMP14+EGFR
ALBUMIN: 4.4 g/dL (ref 3.6–4.8)
ALT: 8 IU/L (ref 0–44)
AST: 14 IU/L (ref 0–40)
Albumin/Globulin Ratio: 1.8 (ref 1.1–2.5)
Alkaline Phosphatase: 67 IU/L (ref 39–117)
BUN / CREAT RATIO: 17 (ref 10–22)
BUN: 15 mg/dL (ref 8–27)
Bilirubin Total: 0.4 mg/dL (ref 0.0–1.2)
CALCIUM: 9.5 mg/dL (ref 8.6–10.2)
CO2: 27 mmol/L (ref 18–29)
CREATININE: 0.87 mg/dL (ref 0.76–1.27)
Chloride: 96 mmol/L (ref 96–106)
GFR, EST AFRICAN AMERICAN: 106 mL/min/{1.73_m2} (ref >59)
GFR, EST NON AFRICAN AMERICAN: 92 mL/min/{1.73_m2} (ref >59)
GLOBULIN, TOTAL: 2.5 g/dL (ref 1.5–4.5)
Glucose: 91 mg/dL (ref 65–99)
POTASSIUM: 4 mmol/L (ref 3.5–5.2)
SODIUM: 142 mmol/L (ref 134–144)
Total Protein: 6.9 g/dL (ref 6.0–8.5)

## 2015-12-18 LAB — HEMOGLOBIN A1C
ESTIMATED AVERAGE GLUCOSE: 217 mg/dL
Hgb A1c MFr Bld: 9.2 % — ABNORMAL HIGH (ref 4.8–5.6)

## 2015-12-24 ENCOUNTER — Encounter: Payer: Self-pay | Admitting: "Endocrinology

## 2015-12-24 ENCOUNTER — Ambulatory Visit (INDEPENDENT_AMBULATORY_CARE_PROVIDER_SITE_OTHER): Payer: BLUE CROSS/BLUE SHIELD | Admitting: "Endocrinology

## 2015-12-24 VITALS — BP 155/87 | HR 97 | Ht 64.0 in | Wt 218.0 lb

## 2015-12-24 DIAGNOSIS — I10 Essential (primary) hypertension: Secondary | ICD-10-CM | POA: Diagnosis not present

## 2015-12-24 DIAGNOSIS — Z794 Long term (current) use of insulin: Secondary | ICD-10-CM | POA: Diagnosis not present

## 2015-12-24 DIAGNOSIS — IMO0001 Reserved for inherently not codable concepts without codable children: Secondary | ICD-10-CM

## 2015-12-24 DIAGNOSIS — E1165 Type 2 diabetes mellitus with hyperglycemia: Secondary | ICD-10-CM | POA: Diagnosis not present

## 2015-12-24 DIAGNOSIS — E785 Hyperlipidemia, unspecified: Secondary | ICD-10-CM

## 2015-12-24 MED ORDER — ENALAPRIL MALEATE 20 MG PO TABS: 20.0000 mg | ORAL_TABLET | Freq: Every day | ORAL | Status: AC

## 2015-12-24 MED ORDER — VITAMIN D (ERGOCALCIFEROL) 1.25 MG (50000 UNIT) PO CAPS: 50000.0000 [IU] | ORAL_CAPSULE | ORAL | Status: DC

## 2015-12-24 NOTE — Progress Notes (Signed)
Subjective:    Patient ID: Patrick Taylor, male    DOB: 11-19-1951,    Past Medical History  Diagnosis Date  . DM (diabetes mellitus) ([place])   . Carpal tunnel syndrome   . Anemia   . HTN (hypertension)   . Asthma   . Hyperlipidemia   . Chronic back pain   . Colon polyps    Past Surgical History  Procedure Laterality Date  . Colonoscopy  01/24/09    rectal polyp/adenomatous poylps/tubulovillous  . Abdominal hernia repair    . Colonoscopy  03/02/2012    Procedure: COLONOSCOPY;  Surgeon: Daneil Dolin, MD;  Location: [facility];  Service: Endoscopy;  Laterality: N/A;  11:00   Social History   Social History  . Marital Status: Married    Spouse Name: N/A  . Number of Children: N/A  . Years of Education: N/A   Occupational History  . works at [employer]  . Smoking status: Former Smoker    Quit date: 02/18/1999  . Smokeless tobacco: None  . Alcohol Use: No     Comment: no heavy use in past  . Drug Use: No  . Sexual Activity: Not Asked   Other Topics Concern  . None   Social History Narrative   Outpatient Encounter Prescriptions as of 12/24/2015  Medication Sig  . aspirin 81 MG tablet Take 81 mg by mouth daily.  . budesonide-formoterol (SYMBICORT) 80-4.5 MCG/ACT inhaler Inhale 2 puffs into the lungs 2 (two) times daily.  . cyclobenzaprine (FLEXERIL) 10 MG tablet Take 10 mg by mouth 2 (two) times daily.   . enalapril (VASOTEC) 20 MG tablet Take 1 tablet (20 mg total) by mouth daily.  . furosemide (LASIX) 20 MG tablet Take 20 mg by mouth daily.   [name] Kitchen gabapentin (NEURONTIN) 300 MG capsule Take 300 mg by mouth 3 (three) times daily.   [name] Kitchen HYDROcodone-acetaminophen (NORCO/VICODIN) 5-325 MG per tablet Take 1 tablet by mouth every 4 (four) hours as needed.  . Insulin Lispro Prot & Lispro (HUMALOG MIX 75/25 KWIKPEN) (75-25) 100 UNIT/ML Kwikpen Inject 25 Units into the skin 2 (two) times daily with a meal.  . metFORMIN (GLUCOPHAGE) 1000 MG  tablet Take 1,000 mg by mouth 2 (two) times daily with a meal.  . pravastatin (PRAVACHOL) 20 MG tablet Take 20 mg by mouth daily.  . Vitamin D, Ergocalciferol, (DRISDOL) 50000 units CAPS capsule Take 1 capsule (50,000 Units total) by mouth every 7 (seven) days.  . [DISCONTINUED] enalapril (VASOTEC) 5 MG tablet Take 1 tablet (5 mg total) by mouth daily.  . [DISCONTINUED] Vitamin D, Ergocalciferol, (DRISDOL) 50000 UNITS CAPS capsule Take 1 capsule (50,000 Units total) by mouth every 7 (seven) days.  [name] Kitchen acetaminophen (TYLENOL) 500 MG tablet Take 500 mg by mouth 2 (two) times daily as needed for mild pain or moderate pain.  [name] Kitchen glucose blood (ONE TOUCH TEST STRIPS) test strip Use as instructed TID  . nabumetone (RELAFEN) 750 MG tablet Take 750 mg by mouth 2 (two) times daily.  [name] Kitchen ofloxacin (OCUFLOX) 0.3 % ophthalmic solution Place 1 drop into the left eye 4 (four) times daily. (Patient not taking: Reported on 09/23/2015)  . peg 3350 powder (MOVIPREP) 100 G SOLR Take 1 kit (100 g total) by mouth once. As directed Please purchase 1 Fleets enema to use with the prep (Patient not taking: Reported on 12/08/2014)  . [DISCONTINUED] clindamycin (CLEOCIN) 300 MG capsule Take 1 capsule (300 mg  total) by mouth 3 (three) times daily. (Patient not taking: Reported on 09/23/2015)   No facility-administered encounter medications on file as of 12/24/2015.   ALLERGIES: No Known Allergies VACCINATION STATUS:  There is no immunization history on file for this patient.  Diabetes He presents for his follow-up diabetic visit. He has type 2 diabetes mellitus. Onset time: He was diagnosed at approximate age of 39 years. His disease course has been improving. There are no hypoglycemic associated symptoms. Pertinent negatives for hypoglycemia include no confusion, headaches, pallor or seizures. Associated symptoms include polydipsia and polyuria. Pertinent negatives for diabetes include no chest pain, no fatigue, no polyphagia  and no weakness. There are no hypoglycemic complications. Symptoms are improving. Risk factors for coronary artery disease include dyslipidemia, diabetes mellitus, hypertension and sedentary lifestyle. Current diabetic treatment includes oral agent (monotherapy). He is compliant with treatment most of the time. His weight is increasing steadily. He is following a generally unhealthy diet. He participates in exercise intermittently. His breakfast blood glucose range is generally 180-200 mg/dl. His lunch blood glucose range is generally 180-200 mg/dl. His overall blood glucose range is 180-200 mg/dl. An ACE inhibitor/angiotensin II receptor blocker is being taken. Eye exam is current.  Hyperlipidemia This is a chronic problem. The current episode started more than 1 year ago. Pertinent negatives include no chest pain, myalgias or shortness of breath. Current antihyperlipidemic treatment includes statins.  Hypertension This is a chronic problem. The current episode started more than 1 year ago. Pertinent negatives include no chest pain, headaches, neck pain, palpitations or shortness of breath. Past treatments include ACE inhibitors.     Review of Systems  Constitutional: Negative for fatigue and unexpected weight change.  HENT: Negative for dental problem, mouth sores and trouble swallowing.   Eyes: Negative for visual disturbance.  Respiratory: Negative for cough, choking, chest tightness, shortness of breath and wheezing.   Cardiovascular: Negative for chest pain, palpitations and leg swelling.  Gastrointestinal: Negative for nausea, vomiting, abdominal pain, diarrhea, constipation and abdominal distention.  Endocrine: Positive for polydipsia and polyuria. Negative for polyphagia.  Genitourinary: Negative for dysuria, urgency, hematuria and flank pain.  Musculoskeletal: Negative for myalgias, back pain, gait problem and neck pain.  Skin: Negative for pallor, rash and wound.  Neurological: Negative  for seizures, syncope, weakness, numbness and headaches.  Psychiatric/Behavioral: Negative.  Negative for confusion and dysphoric mood.    Objective:    BP 155/87 mmHg  Pulse 97  Ht '5\' 4"'  (1.626 m)  Wt 218 lb (98.884 kg)  BMI 37.40 kg/m2  SpO2 96%  Wt Readings from Last 3 Encounters:  12/24/15 218 lb (98.884 kg)  09/23/15 218 lb (98.884 kg)  04/12/15 205 lb (92.987 kg)    Physical Exam  Constitutional: He is oriented to person, place, and time. He appears well-developed and well-nourished. He is cooperative. No distress.  HENT:  Head: Normocephalic and atraumatic.  Eyes: EOM are normal.  Neck: Normal range of motion. Neck supple. No tracheal deviation present. No thyromegaly present.  Cardiovascular: Normal rate, S1 normal, S2 normal and normal heart sounds.  Exam reveals no gallop.   No murmur heard. Pulses:      Dorsalis pedis pulses are 1+ on the right side, and 1+ on the left side.       Posterior tibial pulses are 1+ on the right side, and 1+ on the left side.  Pulmonary/Chest: Breath sounds normal. No respiratory distress. He has no wheezes.  Abdominal: Soft. Bowel sounds are normal. He  exhibits no distension. There is no tenderness. There is no guarding and no CVA tenderness.  Musculoskeletal: He exhibits no edema.       Right shoulder: He exhibits no swelling and no deformity.  Neurological: He is alert and oriented to person, place, and time. He has normal strength and normal reflexes. No cranial nerve deficit or sensory deficit. Gait normal.  Skin: Skin is warm and dry. No rash noted. No cyanosis. Nails show no clubbing.  Psychiatric: He has a normal mood and affect. His speech is normal and behavior is normal. Judgment and thought content normal. Cognition and memory are normal.    Results for orders placed or performed in visit on 09/23/15  [id number]+EGFR  Result Value Ref Range   Glucose 91 65 - 99 mg/dL   BUN 15 8 - 27 mg/dL   Creatinine, Ser 0.87 0.76 - 1.27 mg/dL    GFR calc non Af Amer 92 >59 mL/min/1.73   GFR calc Af Amer 106 >59 mL/min/1.73   BUN/Creatinine Ratio 17 10 - 22   Sodium 142 134 - 144 mmol/L   Potassium 4.0 3.5 - 5.2 mmol/L   Chloride 96 96 - 106 mmol/L   CO2 27 18 - 29 mmol/L   Calcium 9.5 8.6 - 10.2 mg/dL   Total Protein 6.9 6.0 - 8.5 g/dL   Albumin 4.4 3.6 - 4.8 g/dL   Globulin, Total 2.5 1.5 - 4.5 g/dL   Albumin/Globulin Ratio 1.8 1.1 - 2.5   Bilirubin Total 0.4 0.0 - 1.2 mg/dL   Alkaline Phosphatase 67 39 - 117 IU/L   AST 14 0 - 40 IU/L   ALT 8 0 - 44 IU/L  Hemoglobin A1c  Result Value Ref Range   Hgb A1c MFr Bld 9.2 (H) 4.8 - 5.6 %   Est. average glucose Bld gHb Est-mCnc 217 mg/dL  Hemoglobin A1c  Result Value Ref Range   Hgb A1c MFr Bld 9.3 (A) 4.0 - 6.0 %  TSH  Result Value Ref Range   TSH 2.80 .41 - 5.90 uIU/mL   Complete Blood Count (Most recent): Lab Results  Component Value Date   WBC 5.8 02/26/2012   HGB 13.0 02/26/2012   HCT 42.1 02/26/2012   MCV 85.6 02/26/2012   PLT 324 02/26/2012   Chemistry (most recent): Lab Results  Component Value Date   NA 142 12/17/2015   K 4.0 12/17/2015   CL 96 12/17/2015   CO2 27 12/17/2015   BUN 15 12/17/2015   CREATININE 0.87 12/17/2015   Diabetic Labs (most recent): Lab Results  Component Value Date   HGBA1C 9.2* 12/17/2015   HGBA1C 9.3* 09/05/2015     Assessment & Plan:   1. Uncontrolled type 2 diabetes mellitus without complication, with long-term current use of insulin ([place])  Patient came with persistently elevated glucose profile, and  recent A1c of 9.2 %.  Glucose logs and insulin administration records pertaining to this visit,  to be scanned into patient's records.  Recent labs reviewed. - Patient remains at a high risk for more acute and chronic complications of diabetes which include CAD, CVA, CKD, retinopathy, and neuropathy. These are all discussed in detail with the patient.  - I have re-counseled the patient on diet management and weight loss   by adopting a carbohydrate restricted / protein rich  Diet. - Patient is advised to stick to a routine mealtimes to eat 3 meals  a day and avoid unnecessary snacks ( to snack only to correct hypoglycemia).  -  Suggestion is made for patient to avoid simple carbohydrates   from their diet including Cakes , Desserts, Ice Cream,  Soda (  diet and regular) , Sweet Tea , Candies,  Chips, Cookies, Artificial Sweeteners,   and "Sugar-free" Products .  This will help patient to have stable blood glucose profile and potentially avoid unintended  Weight gain.  - The patient  has been  scheduled with Jearld Fenton, RDN, CDE for individualized DM education. - I have approached patient with the following individualized plan to manage diabetes and patient agrees.  -I will increase his Humalog 75/25 to 25 units BID for premeal glucose of 90 or above. -He will continue  strict monitoring of glucose AC and HS.  -Patient is warned not to take insulin without proper monitoring per orders.  -Patient is encouraged to call clinic for blood glucose levels less than 70 or above 300 mg /dl.  -For better insulin sensitivity I will continue MTF at 1055m po BID. -I advised him to discontinue Januvia due to cost. - Patient will be considered for incretin therapy as appropriate next visit. - Patient specific target  for A1c; LDL, HDL, Triglycerides, and  Waist Circumference were discussed in detail.  2) BP/HTN: Controlled. Continue current medications including ACEI. 3) Lipids/HPL: continue statins. 4)  Weight/Diet: CDE consult in progress, exercise, and carbohydrates information provided.  5) Chronic Care/Health Maintenance:  -Patient is on ACEI and Statin medications and encouraged to continue to follow up with Ophthalmology, Podiatrist at least yearly or according to recommendations, and advised to stay away from smoking. I have recommended yearly flu vaccine and pneumonia vaccination at least every 5 years;  moderate intensity exercise for up to 150 minutes weekly; and  sleep for at least 7 hours a day.  4. Vitamin D deficiency I will continue ergoCalciferol 50,000 units weekly for the next 12 weeks.  I advised patient to maintain close follow up with their PCP for primary care needs.  Patient is asked to bring meter and  blood glucose logs during their next visit.   Follow up plan: Return in about 3 months (around 03/23/2016) for diabetes, high blood pressure, high cholesterol, follow up with pre-visit labs, meter, and logs.  GGlade Lloyd MD Phone: [phone] Fax: [phone]  12/24/2015, 2:09 PM

## 2015-12-24 NOTE — Patient Instructions (Signed)

## 2015-12-29 ENCOUNTER — Other Ambulatory Visit: Payer: Self-pay

## 2015-12-29 MED ORDER — BLOOD GLUCOSE MONITOR KIT: PACK | Status: DC

## 2016-01-21 ENCOUNTER — Other Ambulatory Visit: Payer: Self-pay | Admitting: "Endocrinology

## 2016-01-30 ENCOUNTER — Other Ambulatory Visit: Payer: Self-pay | Admitting: "Endocrinology

## 2016-01-31 LAB — BASIC METABOLIC PANEL
BUN/Creatinine Ratio: 13 (ref 10–22)
BUN: 11 mg/dL (ref 8–27)
CALCIUM: 9.9 mg/dL (ref 8.6–10.2)
CO2: 27 mmol/L (ref 18–29)
CREATININE: 0.86 mg/dL (ref 0.76–1.27)
Chloride: 98 mmol/L (ref 96–106)
GFR calc Af Amer: 106 mL/min/{1.73_m2} (ref >59)
GFR calc non Af Amer: 92 mL/min/{1.73_m2} (ref >59)
GLUCOSE: 142 mg/dL — AB (ref 65–99)
Potassium: 4.3 mmol/L (ref 3.5–5.2)
Sodium: 143 mmol/L (ref 134–144)

## 2016-01-31 LAB — HGB A1C W/O EAG: HEMOGLOBIN A1C: 8.9 % — AB (ref 4.8–5.6)

## 2016-03-01 ENCOUNTER — Other Ambulatory Visit: Payer: Self-pay | Admitting: "Endocrinology

## 2016-03-24 ENCOUNTER — Encounter: Payer: Self-pay | Admitting: "Endocrinology

## 2016-03-24 ENCOUNTER — Ambulatory Visit (INDEPENDENT_AMBULATORY_CARE_PROVIDER_SITE_OTHER): Payer: BLUE CROSS/BLUE SHIELD | Admitting: "Endocrinology

## 2016-03-24 VITALS — BP 156/89 | HR 70 | Ht 64.0 in | Wt 222.0 lb

## 2016-03-24 DIAGNOSIS — E785 Hyperlipidemia, unspecified: Secondary | ICD-10-CM

## 2016-03-24 DIAGNOSIS — IMO0001 Reserved for inherently not codable concepts without codable children: Secondary | ICD-10-CM

## 2016-03-24 DIAGNOSIS — E559 Vitamin D deficiency, unspecified: Secondary | ICD-10-CM

## 2016-03-24 DIAGNOSIS — I10 Essential (primary) hypertension: Secondary | ICD-10-CM

## 2016-03-24 DIAGNOSIS — E1165 Type 2 diabetes mellitus with hyperglycemia: Secondary | ICD-10-CM | POA: Diagnosis not present

## 2016-03-24 DIAGNOSIS — Z794 Long term (current) use of insulin: Secondary | ICD-10-CM | POA: Diagnosis not present

## 2016-03-24 MED ORDER — BLOOD GLUCOSE MONITOR KIT: PACK | Status: DC

## 2016-03-24 NOTE — Patient Instructions (Signed)

## 2016-03-24 NOTE — Progress Notes (Signed)
Subjective:    Patient ID: Patrick Taylor, male    DOB: 1951/09/12,    Past Medical History  Diagnosis Date  . DM (diabetes mellitus) ([place])   . Carpal tunnel syndrome   . Anemia   . HTN (hypertension)   . Asthma   . Hyperlipidemia   . Chronic back pain   . Colon polyps    Past Surgical History  Procedure Laterality Date  . Colonoscopy  01/24/09    rectal polyp/adenomatous poylps/tubulovillous  . Abdominal hernia repair    . Colonoscopy  03/02/2012    Procedure: COLONOSCOPY;  Surgeon: Daneil Dolin, MD;  Location: [facility];  Service: Endoscopy;  Laterality: N/A;  11:00   Social History   Social History  . Marital Status: Married    Spouse Name: N/A  . Number of Children: N/A  . Years of Education: N/A   Occupational History  . works at [employer]  . Smoking status: Former Smoker    Quit date: 02/18/1999  . Smokeless tobacco: None  . Alcohol Use: No     Comment: no heavy use in past  . Drug Use: No  . Sexual Activity: Not Asked   Other Topics Concern  . None   Social History Narrative   Outpatient Encounter Prescriptions as of 03/24/2016  Medication Sig  . acetaminophen (TYLENOL) 500 MG tablet Take 500 mg by mouth 2 (two) times daily as needed for mild pain or moderate pain.  [name] Kitchen aspirin 81 MG tablet Take 81 mg by mouth daily.  . budesonide-formoterol (SYMBICORT) 80-4.5 MCG/ACT inhaler Inhale 2 puffs into the lungs 2 (two) times daily.  . cyclobenzaprine (FLEXERIL) 10 MG tablet Take 10 mg by mouth 2 (two) times daily.   . enalapril (VASOTEC) 20 MG tablet Take 1 tablet (20 mg total) by mouth daily.  . furosemide (LASIX) 20 MG tablet Take 20 mg by mouth daily.   [name] Kitchen gabapentin (NEURONTIN) 300 MG capsule Take 300 mg by mouth 3 (three) times daily.   [name] Kitchen HYDROcodone-acetaminophen (NORCO/VICODIN) 5-325 MG per tablet Take 1 tablet by mouth every 4 (four) hours as needed.  . Insulin Lispro Prot & Lispro (HUMALOG MIX 75/25 KWIKPEN)  (75-25) 100 UNIT/ML Kwikpen Inject 30 Units into the skin 2 (two) times daily with a meal.  . metFORMIN (GLUCOPHAGE) 1000 MG tablet Take 1,000 mg by mouth 2 (two) times daily with a meal.  . pravastatin (PRAVACHOL) 20 MG tablet Take 20 mg by mouth daily.  . Vitamin D, Ergocalciferol, (DRISDOL) 50000 units CAPS capsule TAKE ONE CAPSULE EVERY WEEK  . BAYER CONTOUR TEST test strip USE TO TEST BLOOD SUAGR 3 TIMES A DAY  . blood glucose meter kit and supplies KIT Dispense based on patient and insurance preference. Use up to three times daily as directed. (FOR ICD E11.65)  . blood glucose meter kit and supplies KIT Dispense based on patient and insurance preference. Use up to three times daily as directed. (FOR ICD-E11.65)  . [DISCONTINUED] nabumetone (RELAFEN) 750 MG tablet Take 750 mg by mouth 2 (two) times daily.  . [DISCONTINUED] ofloxacin (OCUFLOX) 0.3 % ophthalmic solution Place 1 drop into the left eye 4 (four) times daily. (Patient not taking: Reported on 09/23/2015)  . [DISCONTINUED] peg 3350 powder (MOVIPREP) 100 G SOLR Take 1 kit (100 g total) by mouth once. As directed Please purchase 1 Fleets enema to use with the prep (Patient not taking: Reported on 12/08/2014)  No facility-administered encounter medications on file as of 03/24/2016.   ALLERGIES: No Known Allergies VACCINATION STATUS:  There is no immunization history on file for this patient.  Diabetes He presents for his follow-up diabetic visit. He has type 2 diabetes mellitus. Onset time: He was diagnosed at approximate age of 28 years. His disease course has been improving. There are no hypoglycemic associated symptoms. Pertinent negatives for hypoglycemia include no confusion, headaches, pallor or seizures. Associated symptoms include polydipsia and polyuria. Pertinent negatives for diabetes include no chest pain, no fatigue, no polyphagia and no weakness. There are no hypoglycemic complications. Symptoms are improving. Risk  factors for coronary artery disease include dyslipidemia, diabetes mellitus, hypertension and sedentary lifestyle. Current diabetic treatment includes oral agent (monotherapy). He is compliant with treatment most of the time. His weight is increasing steadily. He is following a generally unhealthy diet. He participates in exercise intermittently. His breakfast blood glucose range is generally 180-200 mg/dl. His lunch blood glucose range is generally 180-200 mg/dl. His overall blood glucose range is 180-200 mg/dl. An ACE inhibitor/angiotensin II receptor blocker is being taken. Eye exam is current.  Hyperlipidemia This is a chronic problem. The current episode started more than 1 year ago. Pertinent negatives include no chest pain, myalgias or shortness of breath. Current antihyperlipidemic treatment includes statins.  Hypertension This is a chronic problem. The current episode started more than 1 year ago. Pertinent negatives include no chest pain, headaches, neck pain, palpitations or shortness of breath. Past treatments include ACE inhibitors.     Review of Systems  Constitutional: Negative for fatigue and unexpected weight change.  HENT: Negative for dental problem, mouth sores and trouble swallowing.   Eyes: Negative for visual disturbance.  Respiratory: Negative for cough, choking, chest tightness, shortness of breath and wheezing.   Cardiovascular: Negative for chest pain, palpitations and leg swelling.  Gastrointestinal: Negative for nausea, vomiting, abdominal pain, diarrhea, constipation and abdominal distention.  Endocrine: Positive for polydipsia and polyuria. Negative for polyphagia.  Genitourinary: Negative for dysuria, urgency, hematuria and flank pain.  Musculoskeletal: Negative for myalgias, back pain, gait problem and neck pain.  Skin: Negative for pallor, rash and wound.  Neurological: Negative for seizures, syncope, weakness, numbness and headaches.  Psychiatric/Behavioral:  Negative.  Negative for confusion and dysphoric mood.    Objective:    BP 156/89 mmHg  Pulse 70  Ht _0  (1.626 m)  Wt 222 lb (100.699 kg)  BMI 38.09 kg/m2  SpO2 97%  Wt Readings from Last 3 Encounters:  03/24/16 222 lb (100.699 kg)  12/24/15 218 lb (98.884 kg)  09/23/15 218 lb (98.884 kg)    Physical Exam  Constitutional: He is oriented to person, place, and time. He appears well-developed and well-nourished. He is cooperative. No distress.  HENT:  Head: Normocephalic and atraumatic.  Eyes: EOM are normal.  Neck: Normal range of motion. Neck supple. No tracheal deviation present. No thyromegaly present.  Cardiovascular: Normal rate, S1 normal, S2 normal and normal heart sounds.  Exam reveals no gallop.   No murmur heard. Pulses:      Dorsalis pedis pulses are 1+ on the right side, and 1+ on the left side.       Posterior tibial pulses are 1+ on the right side, and 1+ on the left side.  Pulmonary/Chest: Breath sounds normal. No respiratory distress. He has no wheezes.  Abdominal: Soft. Bowel sounds are normal. He exhibits no distension. There is no tenderness. There is no guarding and no CVA tenderness.  Musculoskeletal: He exhibits no edema.       Right shoulder: He exhibits no swelling and no deformity.  Neurological: He is alert and oriented to person, place, and time. He has normal strength and normal reflexes. No cranial nerve deficit or sensory deficit. Gait normal.  Skin: Skin is warm and dry. No rash noted. No cyanosis. Nails show no clubbing.  Psychiatric: He has a normal mood and affect. His speech is normal and behavior is normal. Judgment and thought content normal. Cognition and memory are normal.    Results for orders placed or performed in visit on 37/62/83  Basic metabolic panel  Result Value Ref Range   Glucose 142 (H) 65 - 99 mg/dL   BUN 11 8 - 27 mg/dL   Creatinine, Ser 0.86 0.76 - 1.27 mg/dL   GFR calc non Af Amer 92 >59 mL/min/1.73   GFR calc Af Amer  106 >59 mL/min/1.73   BUN/Creatinine Ratio 13 10 - 22   Sodium 143 134 - 144 mmol/L   Potassium 4.3 3.5 - 5.2 mmol/L   Chloride 98 96 - 106 mmol/L   CO2 27 18 - 29 mmol/L   Calcium 9.9 8.6 - 10.2 mg/dL  Hgb A1c w/o eAG  Result Value Ref Range   Hgb A1c MFr Bld 8.9 (H) 4.8 - 5.6 %  Diabetic Labs (most recent): Lab Results  Component Value Date   HGBA1C 8.9* 01/30/2016   HGBA1C 9.2* 12/17/2015   HGBA1C 9.3* 09/05/2015     Assessment & Plan:   1. Uncontrolled type 2 diabetes mellitus without complication, with long-term current use of insulin ([place])  Patient came with persistently elevated glucose profile, and  recent A1c  Slowly improving to 8.9% from  9.3 %.  Glucose logs and insulin administration records pertaining to this visit,  to be scanned into patient's records.  Recent labs reviewed. - Patient remains at a high risk for more acute and chronic complications of diabetes which include CAD, CVA, CKD, retinopathy, and neuropathy. These are all discussed in detail with the patient.  - I have re-counseled the patient on diet management and weight loss  by adopting a carbohydrate restricted / protein rich  Diet. - Patient is advised to stick to a routine mealtimes to eat 3 meals  a day and avoid unnecessary snacks ( to snack only to correct hypoglycemia).  - Suggestion is made for patient to avoid simple carbohydrates   from their diet including Cakes , Desserts, Ice Cream,  Soda (  diet and regular) , Sweet Tea , Candies,  Chips, Cookies, Artificial Sweeteners,   and "Sugar-free" Products .  This will help patient to have stable blood glucose profile and potentially avoid unintended  Weight gain.  - The patient  has been  scheduled with Jearld Fenton, RDN, CDE for individualized DM education. - I have approached patient with the following individualized plan to manage diabetes and patient agrees.  -I will increase his Humalog 75/25 to 30 units BID for premeal glucose of 90 or  above. -He will continue  strict monitoring of glucose AC and HS.  -Patient is warned not to take insulin without proper monitoring per orders.  -Patient is encouraged to call clinic for blood glucose levels less than 70 or above 300 mg /dl.  -For better insulin sensitivity I will continue MTF at 1042m po BID. -I advised him to discontinue Januvia due to cost. - Patient will be considered for incretin therapy as appropriate next visit. -  Patient specific target  for A1c; LDL, HDL, Triglycerides, and  Waist Circumference were discussed in detail.  2) BP/HTN: uncontrolled. Continue current medications including ACEI. I advised him to be consistent in taking his blood pressure medications in the morning. 3) Lipids/HPL: continue statins. 4)  Weight/Diet: CDE consult in progress, exercise, and carbohydrates information provided.  5) Chronic Care/Health Maintenance:  -Patient is on ACEI and Statin medications and encouraged to continue to follow up with Ophthalmology, Podiatrist at least yearly or according to recommendations, and advised to stay away from smoking. I have recommended yearly flu vaccine and pneumonia vaccination at least every 5 years; moderate intensity exercise for up to 150 minutes weekly; and  sleep for at least 7 hours a day.  4. Vitamin D deficiency I will continue ergoCalciferol 50,000 units weekly for the next 12 weeks.  I advised patient to maintain close follow up with their PCP for primary care needs.  Patient is asked to bring meter and  blood glucose logs during their next visit.   Follow up plan: Return in about 3 months (around 06/23/2016) for diabetes, high blood pressure, high cholesterol, follow up with pre-visit labs, meter, and logs.  Glade Lloyd, MD Phone: [phone]  Fax: [phone]   03/24/2016, 11:27 AM

## 2016-05-18 ENCOUNTER — Other Ambulatory Visit: Payer: Self-pay | Admitting: "Endocrinology

## 2016-05-20 ENCOUNTER — Other Ambulatory Visit: Payer: Self-pay

## 2016-05-20 MED ORDER — GLUCOSE BLOOD VI STRP: ORAL_STRIP | Status: DC

## 2016-06-01 DIAGNOSIS — R52 Pain, unspecified: Secondary | ICD-10-CM | POA: Diagnosis not present

## 2016-06-01 DIAGNOSIS — Z79891 Long term (current) use of opiate analgesic: Secondary | ICD-10-CM | POA: Diagnosis not present

## 2016-06-01 DIAGNOSIS — M5136 Other intervertebral disc degeneration, lumbar region: Secondary | ICD-10-CM | POA: Diagnosis not present

## 2016-06-01 DIAGNOSIS — Z6838 Body mass index (BMI) 38.0-38.9, adult: Secondary | ICD-10-CM | POA: Diagnosis not present

## 2016-06-01 DIAGNOSIS — I10 Essential (primary) hypertension: Secondary | ICD-10-CM | POA: Diagnosis not present

## 2016-06-01 DIAGNOSIS — Z1389 Encounter for screening for other disorder: Secondary | ICD-10-CM | POA: Diagnosis not present

## 2016-06-01 DIAGNOSIS — G894 Chronic pain syndrome: Secondary | ICD-10-CM | POA: Diagnosis not present

## 2016-06-01 DIAGNOSIS — E1165 Type 2 diabetes mellitus with hyperglycemia: Secondary | ICD-10-CM | POA: Diagnosis not present

## 2016-06-01 DIAGNOSIS — E782 Mixed hyperlipidemia: Secondary | ICD-10-CM | POA: Diagnosis not present

## 2016-06-07 ENCOUNTER — Other Ambulatory Visit: Payer: Self-pay | Admitting: "Endocrinology

## 2016-06-28 ENCOUNTER — Ambulatory Visit: Payer: BLUE CROSS/BLUE SHIELD | Admitting: "Endocrinology

## 2016-06-29 DIAGNOSIS — Z794 Long term (current) use of insulin: Secondary | ICD-10-CM | POA: Diagnosis not present

## 2016-06-29 DIAGNOSIS — E1165 Type 2 diabetes mellitus with hyperglycemia: Secondary | ICD-10-CM | POA: Diagnosis not present

## 2016-06-30 LAB — CMP14+EGFR
A/G RATIO: 1.6 (ref 1.2–2.2)
ALBUMIN: 4.6 g/dL (ref 3.6–4.8)
ALK PHOS: 68 IU/L (ref 39–117)
ALT: 6 IU/L (ref 0–44)
AST: 21 IU/L (ref 0–40)
BILIRUBIN TOTAL: 0.3 mg/dL (ref 0.0–1.2)
BUN / CREAT RATIO: 18 (ref 10–24)
BUN: 19 mg/dL (ref 8–27)
CHLORIDE: 99 mmol/L (ref 96–106)
CO2: 23 mmol/L (ref 18–29)
Calcium: 9.6 mg/dL (ref 8.6–10.2)
Creatinine, Ser: 1.05 mg/dL (ref 0.76–1.27)
GFR calc Af Amer: 86 mL/min/{1.73_m2} (ref >59)
GFR calc non Af Amer: 75 mL/min/{1.73_m2} (ref >59)
GLOBULIN, TOTAL: 2.9 g/dL (ref 1.5–4.5)
Glucose: 101 mg/dL — ABNORMAL HIGH (ref 65–99)
POTASSIUM: 4.5 mmol/L (ref 3.5–5.2)
SODIUM: 141 mmol/L (ref 134–144)
Total Protein: 7.5 g/dL (ref 6.0–8.5)

## 2016-06-30 LAB — HEMOGLOBIN A1C
ESTIMATED AVERAGE GLUCOSE: 217 mg/dL
HEMOGLOBIN A1C: 9.2 % — AB (ref 4.8–5.6)

## 2016-07-05 DIAGNOSIS — M545 Low back pain: Secondary | ICD-10-CM | POA: Diagnosis not present

## 2016-07-05 DIAGNOSIS — G894 Chronic pain syndrome: Secondary | ICD-10-CM | POA: Diagnosis not present

## 2016-07-05 DIAGNOSIS — Z6838 Body mass index (BMI) 38.0-38.9, adult: Secondary | ICD-10-CM | POA: Diagnosis not present

## 2016-07-14 ENCOUNTER — Encounter: Payer: Self-pay | Admitting: "Endocrinology

## 2016-07-14 ENCOUNTER — Ambulatory Visit (INDEPENDENT_AMBULATORY_CARE_PROVIDER_SITE_OTHER): Payer: PPO | Admitting: "Endocrinology

## 2016-07-14 VITALS — BP 154/87 | HR 77 | Wt 225.5 lb

## 2016-07-14 DIAGNOSIS — Z794 Long term (current) use of insulin: Secondary | ICD-10-CM | POA: Diagnosis not present

## 2016-07-14 DIAGNOSIS — E785 Hyperlipidemia, unspecified: Secondary | ICD-10-CM | POA: Diagnosis not present

## 2016-07-14 DIAGNOSIS — IMO0001 Reserved for inherently not codable concepts without codable children: Secondary | ICD-10-CM

## 2016-07-14 DIAGNOSIS — E1165 Type 2 diabetes mellitus with hyperglycemia: Secondary | ICD-10-CM | POA: Diagnosis not present

## 2016-07-14 DIAGNOSIS — I10 Essential (primary) hypertension: Secondary | ICD-10-CM

## 2016-07-14 MED ORDER — LIRAGLUTIDE 18 MG/3ML ~~LOC~~ SOPN: 0.6000 mg | PEN_INJECTOR | Freq: Every day | SUBCUTANEOUS | 2 refills | Status: DC

## 2016-07-14 NOTE — Progress Notes (Signed)
Subjective:    Patient ID: Patrick Taylor, male    DOB: [birth year],    Past Medical History:  Diagnosis Date  . Anemia   . Asthma   . Carpal tunnel syndrome   . Chronic back pain   . Colon polyps   . DM (diabetes mellitus) ([place])   . HTN (hypertension)   . Hyperlipidemia    Past Surgical History:  Procedure Laterality Date  . ABDOMINAL HERNIA REPAIR    . COLONOSCOPY  01/24/09   rectal polyp/adenomatous poylps/tubulovillous  . COLONOSCOPY  03/02/2012   Procedure: COLONOSCOPY;  Surgeon: Daneil Dolin, MD;  Location: [facility];  Service: Endoscopy;  Laterality: N/A;  11:00   Social History   Social History  . Marital status: Married    Spouse name: N/A  . Number of children: N/A  . Years of education: N/A   Occupational History  . works at [employer]  . Smoking status: Former Smoker    Quit date: 02/18/1999  . Smokeless tobacco: None  . Alcohol use No     Comment: no heavy use in past  . Drug use: No  . Sexual activity: Not Asked   Other Topics Concern  . None   Social History Narrative  . None   Outpatient Encounter Prescriptions as of 07/14/2016  Medication Sig  . aspirin 81 MG tablet Take 81 mg by mouth daily.  . blood glucose meter kit and supplies KIT Dispense based on patient and insurance preference. Use up to three times daily as directed. (FOR ICD E11.65)  . blood glucose meter kit and supplies KIT Dispense based on patient and insurance preference. Use up to three times daily as directed. (FOR ICD-E11.65)  . budesonide-formoterol (SYMBICORT) 80-4.5 MCG/ACT inhaler Inhale 2 puffs into the lungs 2 (two) times daily.  . cyclobenzaprine (FLEXERIL) 10 MG tablet Take 10 mg by mouth 2 (two) times daily.   . enalapril (VASOTEC) 20 MG tablet Take 1 tablet (20 mg total) by mouth daily.  . furosemide (LASIX) 20 MG tablet Take 20 mg by mouth daily.   [name] Kitchen gabapentin (NEURONTIN) 300 MG capsule Take 300 mg by mouth 3 (three) times  daily.   [name] Kitchen glucose blood test strip Use as instructed 4 x daily. E11.65. One touch ultra  . HYDROcodone-acetaminophen (NORCO/VICODIN) 5-325 MG per tablet Take 1 tablet by mouth every 4 (four) hours as needed.  . Insulin Lispro Prot & Lispro (HUMALOG MIX 75/25 KWIKPEN) (75-25) 100 UNIT/ML Kwikpen Inject 30 Units into the skin 2 (two) times daily with a meal.  . metFORMIN (GLUCOPHAGE) 1000 MG tablet Take 1,000 mg by mouth 2 (two) times daily with a meal.  . pravastatin (PRAVACHOL) 20 MG tablet Take 20 mg by mouth daily.  . Vitamin D, Ergocalciferol, (DRISDOL) 50000 units CAPS capsule TAKE ONE CAPSULE BY MOUTH EVERY WEEK  . Liraglutide 18 MG/3ML SOPN Inject 0.1 mLs (0.6 mg total) into the skin daily before supper.  . [DISCONTINUED] acetaminophen (TYLENOL) 500 MG tablet Take 500 mg by mouth 2 (two) times daily as needed for mild pain or moderate pain.   No facility-administered encounter medications on file as of 07/14/2016.    ALLERGIES: No Known Allergies VACCINATION STATUS:  There is no immunization history on file for this patient.  Diabetes  He presents for his follow-up diabetic visit. He has type 2 diabetes mellitus. Onset time: He was diagnosed at approximate age of 60 years. His disease  course has been worsening. There are no hypoglycemic associated symptoms. Pertinent negatives for hypoglycemia include no confusion, headaches, pallor or seizures. Associated symptoms include polydipsia and polyuria. Pertinent negatives for diabetes include no chest pain, no fatigue, no polyphagia and no weakness. There are no hypoglycemic complications. Symptoms are worsening. Risk factors for coronary artery disease include dyslipidemia, diabetes mellitus, hypertension and sedentary lifestyle. Current diabetic treatment includes oral agent (monotherapy). He is compliant with treatment most of the time. His weight is increasing steadily. He is following a generally unhealthy diet. He never participates in  exercise. His breakfast blood glucose range is generally 180-200 mg/dl. His lunch blood glucose range is generally 180-200 mg/dl. His overall blood glucose range is 180-200 mg/dl. An ACE inhibitor/angiotensin II receptor blocker is being taken. Eye exam is current.  Hyperlipidemia  This is a chronic problem. The current episode started more than 1 year ago. Pertinent negatives include no chest pain, myalgias or shortness of breath. Current antihyperlipidemic treatment includes statins.  Hypertension  This is a chronic problem. The current episode started more than 1 year ago. Pertinent negatives include no chest pain, headaches, neck pain, palpitations or shortness of breath. Past treatments include ACE inhibitors.     Review of Systems  Constitutional: Negative for fatigue and unexpected weight change.  HENT: Negative for dental problem, mouth sores and trouble swallowing.   Eyes: Negative for visual disturbance.  Respiratory: Negative for cough, choking, chest tightness, shortness of breath and wheezing.   Cardiovascular: Negative for chest pain, palpitations and leg swelling.  Gastrointestinal: Negative for abdominal distention, abdominal pain, constipation, diarrhea, nausea and vomiting.  Endocrine: Positive for polydipsia and polyuria. Negative for polyphagia.  Genitourinary: Negative for dysuria, flank pain, hematuria and urgency.  Musculoskeletal: Negative for back pain, gait problem, myalgias and neck pain.  Skin: Negative for pallor, rash and wound.  Neurological: Negative for seizures, syncope, weakness, numbness and headaches.  Psychiatric/Behavioral: Negative.  Negative for confusion and dysphoric mood.    Objective:    BP (!) 154/87   Pulse 77   Wt 225 lb 8 oz (102.3 kg)   BMI 38.71 kg/m   Wt Readings from Last 3 Encounters:  07/14/16 225 lb 8 oz (102.3 kg)  03/24/16 222 lb (100.7 kg)  12/24/15 218 lb (98.9 kg)    Physical Exam  Constitutional: He is oriented to  person, place, and time. He appears well-developed and well-nourished. He is cooperative. No distress.  HENT:  Head: Normocephalic and atraumatic.  Eyes: EOM are normal.  Neck: Normal range of motion. Neck supple. No tracheal deviation present. No thyromegaly present.  Cardiovascular: Normal rate, S1 normal, S2 normal and normal heart sounds.  Exam reveals no gallop.   No murmur heard. Pulses:      Dorsalis pedis pulses are 1+ on the right side, and 1+ on the left side.       Posterior tibial pulses are 1+ on the right side, and 1+ on the left side.  Pulmonary/Chest: Breath sounds normal. No respiratory distress. He has no wheezes.  Abdominal: Soft. Bowel sounds are normal. He exhibits no distension. There is no tenderness. There is no guarding and no CVA tenderness.  Musculoskeletal: He exhibits no edema.       Right shoulder: He exhibits no swelling and no deformity.  Neurological: He is alert and oriented to person, place, and time. He has normal strength and normal reflexes. No cranial nerve deficit or sensory deficit. Gait normal.  Skin: Skin is warm and  dry. No rash noted. No cyanosis. Nails show no clubbing.  Psychiatric: He has a normal mood and affect. His speech is normal and behavior is normal. Judgment and thought content normal. Cognition and memory are normal.    Results for orders placed or performed in visit on 03/24/16  CMP14+EGFR  Result Value Ref Range   Glucose 101 (H) 65 - 99 mg/dL   BUN 19 8 - 27 mg/dL   Creatinine, Ser 1.05 0.76 - 1.27 mg/dL   GFR calc non Af Amer 75 >59 mL/min/1.73   GFR calc Af Amer 86 >59 mL/min/1.73   BUN/Creatinine Ratio 18 10 - 24   Sodium 141 134 - 144 mmol/L   Potassium 4.5 3.5 - 5.2 mmol/L   Chloride 99 96 - 106 mmol/L   CO2 23 18 - 29 mmol/L   Calcium 9.6 8.6 - 10.2 mg/dL   Total Protein 7.5 6.0 - 8.5 g/dL   Albumin 4.6 3.6 - 4.8 g/dL   Globulin, Total 2.9 1.5 - 4.5 g/dL   Albumin/Globulin Ratio 1.6 1.2 - 2.2   Bilirubin Total 0.3  0.0 - 1.2 mg/dL   Alkaline Phosphatase 68 39 - 117 IU/L   AST 21 0 - 40 IU/L   ALT 6 0 - 44 IU/L  Hemoglobin A1c  Result Value Ref Range   Hgb A1c MFr Bld 9.2 (H) 4.8 - 5.6 %   Est. average glucose Bld gHb Est-mCnc 217 mg/dL  Diabetic Labs (most recent): Lab Results  Component Value Date   HGBA1C 9.2 (H) 06/29/2016   HGBA1C 8.9 (H) 01/30/2016   HGBA1C 9.2 (H) 12/17/2015     Assessment & Plan:   1. Uncontrolled type 2 diabetes mellitus without complication, with long-term current use of insulin ([place])  Patient came with persistently elevated glucose profile, and  recent A1c  increasing to 9.2% from 8.9% .  Glucose logs and insulin administration records pertaining to this visit,  to be scanned into patient's records.  Recent labs reviewed. - Patient remains at a high risk for more acute and chronic complications of diabetes which include CAD, CVA, CKD, retinopathy, and neuropathy. These are all discussed in detail with the patient.  - I have re-counseled the patient on diet management and weight loss  by adopting a carbohydrate restricted / protein rich  Diet. - Patient is advised to stick to a routine mealtimes to eat 3 meals  a day and avoid unnecessary snacks ( to snack only to correct hypoglycemia).  - Suggestion is made for patient to avoid simple carbohydrates   from their diet including Cakes , Desserts, Ice Cream,  Soda (  diet and regular) , Sweet Tea , Candies,  Chips, Cookies, Artificial Sweeteners,   and "Sugar-free" Products .  This will help patient to have stable blood glucose profile and potentially avoid unintended  Weight gain.  - The patient  has been  scheduled with Jearld Fenton, RDN, CDE for individualized DM education. - I have approached patient with the following individualized plan to manage diabetes and patient agrees.  -I will continue his Humalog 75/25 to 30 units BID for premeal glucose of 90 or above. -He will continue  strict monitoring of glucose AC  and HS.  -Patient is warned not to take insulin without proper monitoring per orders.  -Patient is encouraged to call clinic for blood glucose levels less than 70 or above 300 mg /dl.  -For better insulin sensitivity I will continue MTF at 1052m po BID. -  Januvia  was discontinued due to cost. - I have added Victoza 0.6 mg daily, SE and precaution discussed with him. He is asking for rx for Saxenda. - Patient specific target  for A1c; LDL, HDL, Triglycerides, and  Waist Circumference were discussed in detail.  2) BP/HTN: uncontrolled. Continue current medications including ACEI. I advised him to be consistent in taking his blood pressure medications in the morning. 3) Lipids/HPL: continue statins. 4)  Weight/Diet: CDE consult in progress, exercise, and carbohydrates information provided.  5) Chronic Care/Health Maintenance:  -Patient is on ACEI and Statin medications and encouraged to continue to follow up with Ophthalmology, Podiatrist at least yearly or according to recommendations, and advised to stay away from smoking. I have recommended yearly flu vaccine and pneumonia vaccination at least every 5 years; moderate intensity exercise for up to 150 minutes weekly; and  sleep for at least 7 hours a day.  4. Vitamin D deficiency - He is status post therapy with vitamin D 50,000 units weekly for 12 weeks.  I advised patient to maintain close follow up with their PCP for primary care needs.  Patient is asked to bring meter and  blood glucose logs during their next visit.   Follow up plan: Return in about 3 months (around 10/14/2016) for follow up with pre-visit labs, meter, and logs.  Glade Lloyd, MD Phone: [phone]  Fax: [phone]   07/14/2016, 10:17 AM

## 2016-08-03 DIAGNOSIS — Z79891 Long term (current) use of opiate analgesic: Secondary | ICD-10-CM | POA: Diagnosis not present

## 2016-08-06 DIAGNOSIS — G894 Chronic pain syndrome: Secondary | ICD-10-CM | POA: Diagnosis not present

## 2016-08-06 DIAGNOSIS — E782 Mixed hyperlipidemia: Secondary | ICD-10-CM | POA: Diagnosis not present

## 2016-08-06 DIAGNOSIS — Z79891 Long term (current) use of opiate analgesic: Secondary | ICD-10-CM | POA: Diagnosis not present

## 2016-08-06 DIAGNOSIS — E669 Obesity, unspecified: Secondary | ICD-10-CM | POA: Diagnosis not present

## 2016-08-06 DIAGNOSIS — I10 Essential (primary) hypertension: Secondary | ICD-10-CM | POA: Diagnosis not present

## 2016-08-06 DIAGNOSIS — Z6838 Body mass index (BMI) 38.0-38.9, adult: Secondary | ICD-10-CM | POA: Diagnosis not present

## 2016-08-27 ENCOUNTER — Encounter (HOSPITAL_COMMUNITY): Payer: Self-pay | Admitting: *Deleted

## 2016-08-27 ENCOUNTER — Emergency Department (HOSPITAL_COMMUNITY)
Admission: EM | Admit: 2016-08-27 | Discharge: 2016-08-27 | Disposition: A | Discharge status: Home / Self Care | Payer: PPO | Attending: Emergency Medicine | Admitting: Emergency Medicine

## 2016-08-27 DIAGNOSIS — Z7984 Long term (current) use of oral hypoglycemic drugs: Secondary | ICD-10-CM | POA: Insufficient documentation

## 2016-08-27 DIAGNOSIS — J069 Acute upper respiratory infection, unspecified: Secondary | ICD-10-CM | POA: Insufficient documentation

## 2016-08-27 DIAGNOSIS — J45909 Unspecified asthma, uncomplicated: Secondary | ICD-10-CM | POA: Diagnosis not present

## 2016-08-27 DIAGNOSIS — I10 Essential (primary) hypertension: Secondary | ICD-10-CM | POA: Diagnosis not present

## 2016-08-27 DIAGNOSIS — Z87891 Personal history of nicotine dependence: Secondary | ICD-10-CM | POA: Insufficient documentation

## 2016-08-27 DIAGNOSIS — E1165 Type 2 diabetes mellitus with hyperglycemia: Secondary | ICD-10-CM | POA: Insufficient documentation

## 2016-08-27 DIAGNOSIS — R739 Hyperglycemia, unspecified: Secondary | ICD-10-CM

## 2016-08-27 DIAGNOSIS — Z7982 Long term (current) use of aspirin: Secondary | ICD-10-CM | POA: Diagnosis not present

## 2016-08-27 DIAGNOSIS — Z79899 Other long term (current) drug therapy: Secondary | ICD-10-CM | POA: Insufficient documentation

## 2016-08-27 DIAGNOSIS — Z794 Long term (current) use of insulin: Secondary | ICD-10-CM | POA: Insufficient documentation

## 2016-08-27 LAB — URINALYSIS, ROUTINE W REFLEX MICROSCOPIC
Bilirubin Urine: NEGATIVE
GLUCOSE, UA: 500 mg/dL — AB
HGB URINE DIPSTICK: NEGATIVE
Ketones, ur: NEGATIVE mg/dL
LEUKOCYTES UA: NEGATIVE
Nitrite: NEGATIVE
Protein, ur: NEGATIVE mg/dL
SPECIFIC GRAVITY, URINE: 1.015 (ref 1.005–1.030)
pH: 5.5 (ref 5.0–8.0)

## 2016-08-27 LAB — CBG MONITORING, ED
Glucose-Capillary: 325 mg/dL — ABNORMAL HIGH (ref 65–99)
Glucose-Capillary: 165 mg/dL — ABNORMAL HIGH (ref 65–99)

## 2016-08-27 LAB — BASIC METABOLIC PANEL
Anion gap: 8 (ref 5–15)
BUN: 15 mg/dL (ref 6–20)
CHLORIDE: 97 mmol/L — AB (ref 101–111)
CO2: 32 mmol/L (ref 22–32)
Calcium: 9.8 mg/dL (ref 8.9–10.3)
Creatinine, Ser: 0.95 mg/dL (ref 0.61–1.24)
GFR calc Af Amer: >60 mL/min (ref >60)
GFR calc non Af Amer: >60 mL/min (ref >60)
GLUCOSE: 262 mg/dL — AB (ref 65–99)
POTASSIUM: 4.7 mmol/L (ref 3.5–5.1)
Sodium: 137 mmol/L (ref 135–145)

## 2016-08-27 LAB — CBC
HEMATOCRIT: 39.7 % (ref 39.0–52.0)
HEMOGLOBIN: 12.7 g/dL — AB (ref 13.0–17.0)
MCH: 26.4 pg (ref 26.0–34.0)
MCHC: 32 g/dL (ref 30.0–36.0)
MCV: 82.5 fL (ref 78.0–100.0)
Platelets: 349 10*3/uL (ref 150–400)
RBC: 4.81 MIL/uL (ref 4.22–5.81)
RDW: 12.9 % (ref 11.5–15.5)
WBC: 6.9 10*3/uL (ref 4.0–10.5)

## 2016-08-27 NOTE — ED Triage Notes (Signed)
Pt comes in with elevated blood sugar in the 300's. Pt states he has had a head cold. Denies any n/v/d.

## 2016-08-27 NOTE — ED Provider Notes (Signed)
[facility] DEPT Provider Note   CSN: [id number] Arrival date & time: 08/27/16  1054     History   Chief Complaint Chief Complaint  Patient presents with  . Hyperglycemia    HPI Patrick Taylor is a 65 y.o. male.  Patient presents emergency department complaining of elevated blood glucose.  He is diabetic and has been compliant with his medications.  He did eat a sugar-based cereal this morning.  He otherwise reports that his diet has been consistent with a diabetic diet.  His blood sugars normally in the 90s to low 100s.  He has been dealing with a sinus cold described as nasal congestion and sinus pressure over the past several days.  No sore throat.  No cough or congestion.  Denies shortness of breath.  Denies nausea vomiting or diarrhea.  He came to the ER for evaluation given his elevated blood sugars   The history is provided by the patient.  Hyperglycemia    Past Medical History:  Diagnosis Date  . Anemia   . Asthma   . Carpal tunnel syndrome   . Chronic back pain   . Colon polyps   . DM (diabetes mellitus) ([place])   . HTN (hypertension)   . Hyperlipidemia     Patient Active Problem List   Diagnosis Date Noted  . Morbid obesity due to excess calories ([place]) 07/14/2016  . Uncontrolled type 2 diabetes mellitus without complication, with long-term current use of insulin ([place]) 09/23/2015  . Benign hypertension 09/23/2015  . Hyperlipidemia 09/23/2015  . Vitamin D deficiency 09/23/2015  . Tubular adenoma of colon 02/18/2012  . Anemia 02/18/2012    Past Surgical History:  Procedure Laterality Date  . ABDOMINAL HERNIA REPAIR    . COLONOSCOPY  01/24/09   rectal polyp/adenomatous poylps/tubulovillous  . COLONOSCOPY  03/02/2012   Procedure: COLONOSCOPY;  Surgeon: Daneil Dolin, MD;  Location: [facility];  Service: Endoscopy;  Laterality: N/A;  11:00       Home Medications    Prior to Admission medications   Medication Sig Start Date End Date Taking?  Authorizing Provider  aspirin 81 MG tablet Take 81 mg by mouth daily.    Historical Provider, MD  blood glucose meter kit and supplies KIT Dispense based on patient and insurance preference. Use up to three times daily as directed. (FOR ICD E11.65) 12/29/15   Cassandria Anger, MD  blood glucose meter kit and supplies KIT Dispense based on patient and insurance preference. Use up to three times daily as directed. (FOR ICD-E11.65) 03/24/16   Cassandria Anger, MD  budesonide-formoterol (SYMBICORT) 80-4.5 MCG/ACT inhaler Inhale 2 puffs into the lungs 2 (two) times daily.    Historical Provider, MD  cyclobenzaprine (FLEXERIL) 10 MG tablet Take 10 mg by mouth 2 (two) times daily.  02/18/12   Historical Provider, MD  enalapril (VASOTEC) 20 MG tablet Take 1 tablet (20 mg total) by mouth daily. 12/24/15   Cassandria Anger, MD  furosemide (LASIX) 20 MG tablet Take 20 mg by mouth daily.  01/22/12   Historical Provider, MD  gabapentin (NEURONTIN) 300 MG capsule Take 300 mg by mouth 3 (three) times daily.     Historical Provider, MD  glucose blood test strip Use as instructed 4 x daily. E11.65. One touch ultra 05/20/16   Cassandria Anger, MD  HYDROcodone-acetaminophen (NORCO/VICODIN) 5-325 MG per tablet Take 1 tablet by mouth every 4 (four) hours as needed. 12/08/14   Hope Bunnie Pion, NP  Insulin  Lispro Prot & Lispro (HUMALOG MIX 75/25 KWIKPEN) (75-25) 100 UNIT/ML Kwikpen Inject 30 Units into the skin 2 (two) times daily with a meal.    Historical Provider, MD  Liraglutide 18 MG/3ML SOPN Inject 0.1 mLs (0.6 mg total) into the skin daily before supper. 07/14/16   Cassandria Anger, MD  metFORMIN (GLUCOPHAGE) 1000 MG tablet Take 1,000 mg by mouth 2 (two) times daily with a meal.    Historical Provider, MD  pravastatin (PRAVACHOL) 20 MG tablet Take 20 mg by mouth daily.    Historical Provider, MD  Vitamin D, Ergocalciferol, (DRISDOL) 50000 units CAPS capsule TAKE ONE CAPSULE BY MOUTH EVERY WEEK 06/08/16    Cassandria Anger, MD    Family History Family History  Problem Relation Age of Onset  . Colon cancer      Social History Social History  Substance Use Topics  . Smoking status: Former Smoker    Quit date: 02/18/1999  . Smokeless tobacco: Never Used  . Alcohol use No     Comment: no heavy use in past     Allergies   Review of patient's allergies indicates no known allergies.   Review of Systems Review of Systems  All other systems reviewed and are negative.    Physical Exam Updated Vital Signs BP 159/83 (BP Location: Left Arm)   Pulse 92   Temp 98.4 F (36.9 C) (Oral)   Resp 18   Ht _0  (1.575 m)   Wt 224 lb (101.6 kg)   SpO2 97%   BMI 40.97 kg/m   Physical Exam  Constitutional: He is oriented to person, place, and time. He appears well-developed and well-nourished.  HENT:  Head: Normocephalic and atraumatic.  Bilateral TMs are normal.  Posterior pharynx is normal.  Eyes: EOM are normal.  Neck: Normal range of motion.  Cardiovascular: Normal rate, regular rhythm, normal heart sounds and intact distal pulses.   Pulmonary/Chest: Effort normal and breath sounds normal. No respiratory distress.  Abdominal: Soft. He exhibits no distension. There is no tenderness.  Musculoskeletal: Normal range of motion.  Neurological: He is alert and oriented to person, place, and time.  Skin: Skin is warm and dry.  Psychiatric: He has a normal mood and affect. Judgment normal.  Nursing note and vitals reviewed.    ED Treatments / Results  Labs (all labs ordered are listed, but only abnormal results are displayed) Labs Reviewed  BASIC METABOLIC PANEL - Abnormal; Notable for the following:       Result Value   Chloride 97 (*)    Glucose, Bld 262 (*)    All other components within normal limits  CBC - Abnormal; Notable for the following:    Hemoglobin 12.7 (*)    All other components within normal limits  URINALYSIS, ROUTINE W REFLEX MICROSCOPIC (NOT AT [facility]) -  Abnormal; Notable for the following:    Glucose, UA 500 (*)    All other components within normal limits  CBG MONITORING, ED - Abnormal; Notable for the following:    Glucose-Capillary 325 (*)    All other components within normal limits  CBG MONITORING, ED - Abnormal; Notable for the following:    Glucose-Capillary 165 (*)    All other components within normal limits    EKG  EKG Interpretation None       Radiology No results found.  Procedures Procedures (including critical care time)  Medications Ordered in ED Medications - No data to display   Initial Impression /  Assessment and Plan / ED Course  I have reviewed the triage vital signs and the nursing notes.  Pertinent labs & imaging results that were available during my care of the patient were reviewed by me and considered in my medical decision making (see chart for details).  Clinical Course    Patient is overall well-appearing.  No elevated anion gap.  Discharge home in good condition.  We reiterated the importance of a strong diabetic diet.  Primary care follow-up.  He seems to have a viral upper respiratory tract infection.   Final Clinical Impressions(s) / ED Diagnoses   Final diagnoses:  Hyperglycemia  Viral URI    New Prescriptions New Prescriptions   No medications on file     Jola Schmidt, MD 08/27/16 [phone]

## 2016-09-06 DIAGNOSIS — E1129 Type 2 diabetes mellitus with other diabetic kidney complication: Secondary | ICD-10-CM | POA: Diagnosis not present

## 2016-09-06 DIAGNOSIS — Z1389 Encounter for screening for other disorder: Secondary | ICD-10-CM | POA: Diagnosis not present

## 2016-09-06 DIAGNOSIS — Z6838 Body mass index (BMI) 38.0-38.9, adult: Secondary | ICD-10-CM | POA: Diagnosis not present

## 2016-09-06 DIAGNOSIS — G894 Chronic pain syndrome: Secondary | ICD-10-CM | POA: Diagnosis not present

## 2016-10-18 ENCOUNTER — Ambulatory Visit: Payer: PPO | Admitting: "Endocrinology

## 2016-10-26 ENCOUNTER — Other Ambulatory Visit: Payer: Self-pay | Admitting: "Endocrinology

## 2016-10-27 DIAGNOSIS — I10 Essential (primary) hypertension: Secondary | ICD-10-CM | POA: Diagnosis not present

## 2016-10-27 DIAGNOSIS — Z6838 Body mass index (BMI) 38.0-38.9, adult: Secondary | ICD-10-CM | POA: Diagnosis not present

## 2016-10-27 DIAGNOSIS — E782 Mixed hyperlipidemia: Secondary | ICD-10-CM | POA: Diagnosis not present

## 2016-10-27 DIAGNOSIS — E1165 Type 2 diabetes mellitus with hyperglycemia: Secondary | ICD-10-CM | POA: Diagnosis not present

## 2016-10-27 DIAGNOSIS — Z1389 Encounter for screening for other disorder: Secondary | ICD-10-CM | POA: Diagnosis not present

## 2016-11-15 ENCOUNTER — Ambulatory Visit: Payer: PPO | Admitting: "Endocrinology

## 2016-12-01 DIAGNOSIS — Z794 Long term (current) use of insulin: Secondary | ICD-10-CM | POA: Diagnosis not present

## 2016-12-01 DIAGNOSIS — E1165 Type 2 diabetes mellitus with hyperglycemia: Secondary | ICD-10-CM | POA: Diagnosis not present

## 2016-12-01 DIAGNOSIS — E785 Hyperlipidemia, unspecified: Secondary | ICD-10-CM | POA: Diagnosis not present

## 2016-12-02 LAB — HEMOGLOBIN A1C
ESTIMATED AVERAGE GLUCOSE: 243 mg/dL
Hgb A1c MFr Bld: 10.1 % — ABNORMAL HIGH (ref 4.8–5.6)

## 2016-12-02 LAB — CMP14+EGFR
A/G RATIO: 1.8 (ref 1.2–2.2)
ALT: 8 IU/L (ref 0–44)
AST: 16 IU/L (ref 0–40)
Albumin: 4.5 g/dL (ref 3.6–4.8)
Alkaline Phosphatase: 66 IU/L (ref 39–117)
BILIRUBIN TOTAL: 0.3 mg/dL (ref 0.0–1.2)
BUN/Creatinine Ratio: 16 (ref 10–24)
BUN: 16 mg/dL (ref 8–27)
CALCIUM: 9.7 mg/dL (ref 8.6–10.2)
CHLORIDE: 98 mmol/L (ref 96–106)
CO2: 27 mmol/L (ref 18–29)
Creatinine, Ser: 0.98 mg/dL (ref 0.76–1.27)
GFR, EST AFRICAN AMERICAN: 94 mL/min/{1.73_m2} (ref >59)
GFR, EST NON AFRICAN AMERICAN: 81 mL/min/{1.73_m2} (ref >59)
GLUCOSE: 159 mg/dL — AB (ref 65–99)
Globulin, Total: 2.5 g/dL (ref 1.5–4.5)
POTASSIUM: 4.4 mmol/L (ref 3.5–5.2)
Sodium: 140 mmol/L (ref 134–144)
TOTAL PROTEIN: 7 g/dL (ref 6.0–8.5)

## 2016-12-02 LAB — LIPID PANEL
CHOLESTEROL TOTAL: 135 mg/dL (ref 100–199)
Chol/HDL Ratio: 4 ratio units (ref 0.0–5.0)
HDL: 34 mg/dL — ABNORMAL LOW (ref >39)
LDL CALC: 71 mg/dL (ref 0–99)
TRIGLYCERIDES: 151 mg/dL — AB (ref 0–149)
VLDL Cholesterol Cal: 30 mg/dL (ref 5–40)

## 2016-12-21 ENCOUNTER — Ambulatory Visit (INDEPENDENT_AMBULATORY_CARE_PROVIDER_SITE_OTHER): Payer: PPO | Admitting: "Endocrinology

## 2016-12-21 ENCOUNTER — Encounter: Payer: Self-pay | Admitting: "Endocrinology

## 2016-12-21 VITALS — BP 177/93 | HR 99 | Ht 64.0 in | Wt 226.0 lb

## 2016-12-21 DIAGNOSIS — E559 Vitamin D deficiency, unspecified: Secondary | ICD-10-CM

## 2016-12-21 DIAGNOSIS — E782 Mixed hyperlipidemia: Secondary | ICD-10-CM

## 2016-12-21 DIAGNOSIS — Z794 Long term (current) use of insulin: Secondary | ICD-10-CM | POA: Diagnosis not present

## 2016-12-21 DIAGNOSIS — I10 Essential (primary) hypertension: Secondary | ICD-10-CM

## 2016-12-21 DIAGNOSIS — E1165 Type 2 diabetes mellitus with hyperglycemia: Secondary | ICD-10-CM

## 2016-12-21 DIAGNOSIS — IMO0001 Reserved for inherently not codable concepts without codable children: Secondary | ICD-10-CM

## 2016-12-21 NOTE — Patient Instructions (Signed)

## 2016-12-21 NOTE — Progress Notes (Signed)
Subjective:    Patient ID: Patrick Taylor, male    DOB: 12-Oct-1951,    Past Medical History:  Diagnosis Date  . Anemia   . Asthma   . Carpal tunnel syndrome   . Chronic back pain   . Colon polyps   . DM (diabetes mellitus) ([place])   . HTN (hypertension)   . Hyperlipidemia    Past Surgical History:  Procedure Laterality Date  . ABDOMINAL HERNIA REPAIR    . COLONOSCOPY  01/24/09   rectal polyp/adenomatous poylps/tubulovillous  . COLONOSCOPY  03/02/2012   Procedure: COLONOSCOPY;  Surgeon: Daneil Dolin, MD;  Location: [facility];  Service: Endoscopy;  Laterality: N/A;  11:00   Social History   Social History  . Marital status: Married    Spouse name: N/A  . Number of children: N/A  . Years of education: N/A   Occupational History  . works at [employer]  . Smoking status: Former Smoker    Quit date: 02/18/1999  . Smokeless tobacco: Never Used  . Alcohol use No     Comment: no heavy use in past  . Drug use: No  . Sexual activity: Not Asked   Other Topics Concern  . None   Social History Narrative  . None   Outpatient Encounter Prescriptions as of 12/21/2016  Medication Sig  . aspirin 81 MG tablet Take 81 mg by mouth daily.  . budesonide-formoterol (SYMBICORT) 80-4.5 MCG/ACT inhaler Inhale 2 puffs into the lungs 2 (two) times daily as needed (shortness of breath).   . cyclobenzaprine (FLEXERIL) 10 MG tablet Take 10 mg by mouth 3 (three) times daily as needed for muscle spasms.   . enalapril (VASOTEC) 20 MG tablet Take 1 tablet (20 mg total) by mouth daily.  . furosemide (LASIX) 20 MG tablet Take 20 mg by mouth daily.   [name] Kitchen gabapentin (NEURONTIN) 300 MG capsule Take 300 mg by mouth 3 (three) times daily.   [name] Kitchen glucose blood test strip Use as instructed 4 x daily. E11.65. One touch ultra  . HYDROcodone-acetaminophen (NORCO) 7.5-325 MG tablet Take 1 tablet by mouth 2 (two) times daily as needed for moderate pain.  . Insulin Lispro Prot &  Lispro (HUMALOG MIX 75/25 KWIKPEN) (75-25) 100 UNIT/ML Kwikpen Inject 40 Units into the skin 2 (two) times daily with a meal.  . Liraglutide 18 MG/3ML SOPN Inject 0.1 mLs (0.6 mg total) into the skin daily before supper.  . metFORMIN (GLUCOPHAGE) 1000 MG tablet Take 1,000 mg by mouth 2 (two) times daily with a meal.  . pravastatin (PRAVACHOL) 20 MG tablet Take 20 mg by mouth daily.  . Vitamin D, Ergocalciferol, (DRISDOL) 50000 units CAPS capsule TAKE ONE CAPSULE BY MOUTH EVERY WEEK  . [DISCONTINUED] blood glucose meter kit and supplies KIT Dispense based on patient and insurance preference. Use up to three times daily as directed. (FOR ICD E11.65)  . [DISCONTINUED] blood glucose meter kit and supplies KIT Dispense based on patient and insurance preference. Use up to three times daily as directed. (FOR ICD-E11.65)  . [DISCONTINUED] HYDROcodone-acetaminophen (NORCO/VICODIN) 5-325 MG per tablet Take 1 tablet by mouth every 4 (four) hours as needed. (Patient not taking: Reported on 08/27/2016)   No facility-administered encounter medications on file as of 12/21/2016.    ALLERGIES: No Known Allergies VACCINATION STATUS:  There is no immunization history on file for this patient.  Diabetes  He presents for his follow-up diabetic visit. He has  type 2 diabetes mellitus. Onset time: He was diagnosed at approximate age of 64 years. His disease course has been worsening. There are no hypoglycemic associated symptoms. Pertinent negatives for hypoglycemia include no confusion, headaches, pallor or seizures. Associated symptoms include polydipsia and polyuria. Pertinent negatives for diabetes include no chest pain, no fatigue, no polyphagia and no weakness. There are no hypoglycemic complications. Symptoms are worsening. Risk factors for coronary artery disease include dyslipidemia, diabetes mellitus, hypertension and sedentary lifestyle. Current diabetic treatment includes oral agent (monotherapy). He is compliant  with treatment most of the time. His weight is increasing steadily. He is following a generally unhealthy diet. He never participates in exercise. His breakfast blood glucose range is generally >200 mg/dl. His lunch blood glucose range is generally >200 mg/dl. His dinner blood glucose range is generally >200 mg/dl. His overall blood glucose range is >200 mg/dl. An ACE inhibitor/angiotensin II receptor blocker is being taken. Eye exam is current.  Hyperlipidemia  This is a chronic problem. The current episode started more than 1 year ago. Pertinent negatives include no chest pain, myalgias or shortness of breath. Current antihyperlipidemic treatment includes statins.  Hypertension  This is a chronic problem. The current episode started more than 1 year ago. Pertinent negatives include no chest pain, headaches, neck pain, palpitations or shortness of breath. Past treatments include ACE inhibitors.     Review of Systems  Constitutional: Negative for fatigue and unexpected weight change.  HENT: Negative for dental problem, mouth sores and trouble swallowing.   Eyes: Negative for visual disturbance.  Respiratory: Negative for cough, choking, chest tightness, shortness of breath and wheezing.   Cardiovascular: Negative for chest pain, palpitations and leg swelling.  Gastrointestinal: Negative for abdominal distention, abdominal pain, constipation, diarrhea, nausea and vomiting.  Endocrine: Positive for polydipsia and polyuria. Negative for polyphagia.  Genitourinary: Negative for dysuria, flank pain, hematuria and urgency.  Musculoskeletal: Negative for back pain, gait problem, myalgias and neck pain.  Skin: Negative for pallor, rash and wound.  Neurological: Negative for seizures, syncope, weakness, numbness and headaches.  Psychiatric/Behavioral: Negative.  Negative for confusion and dysphoric mood.    Objective:    BP (!) 177/93   Pulse 99   Ht _0  (1.626 m)   Wt 226 lb (102.5 kg)   BMI  38.79 kg/m   Wt Readings from Last 3 Encounters:  12/21/16 226 lb (102.5 kg)  08/27/16 224 lb (101.6 kg)  07/14/16 225 lb 8 oz (102.3 kg)    Physical Exam  Constitutional: He is oriented to person, place, and time. He appears well-developed and well-nourished. He is cooperative. No distress.  HENT:  Head: Normocephalic and atraumatic.  Eyes: EOM are normal.  Neck: Normal range of motion. Neck supple. No tracheal deviation present. No thyromegaly present.  Cardiovascular: Normal rate, S1 normal, S2 normal and normal heart sounds.  Exam reveals no gallop.   No murmur heard. Pulses:      Dorsalis pedis pulses are 1+ on the right side, and 1+ on the left side.       Posterior tibial pulses are 1+ on the right side, and 1+ on the left side.  Pulmonary/Chest: Breath sounds normal. No respiratory distress. He has no wheezes.  Abdominal: Soft. Bowel sounds are normal. He exhibits no distension. There is no tenderness. There is no guarding and no CVA tenderness.  Musculoskeletal: He exhibits no edema.       Right shoulder: He exhibits no swelling and no deformity.  Neurological: He  is alert and oriented to person, place, and time. He has normal strength and normal reflexes. No cranial nerve deficit or sensory deficit. Gait normal.  Skin: Skin is warm and dry. No rash noted. No cyanosis. Nails show no clubbing.  Psychiatric: He has a normal mood and affect. His speech is normal and behavior is normal. Judgment and thought content normal. Cognition and memory are normal.    Results for orders placed or performed during the hospital encounter of 70/62/37  Basic metabolic panel  Result Value Ref Range   Sodium 137 135 - 145 mmol/L   Potassium 4.7 3.5 - 5.1 mmol/L   Chloride 97 (L) 101 - 111 mmol/L   CO2 32 22 - 32 mmol/L   Glucose, Bld 262 (H) 65 - 99 mg/dL   BUN 15 6 - 20 mg/dL   Creatinine, Ser 0.95 0.61 - 1.24 mg/dL   Calcium 9.8 8.9 - 10.3 mg/dL   GFR calc non Af Amer >60 >60 mL/min    GFR calc Af Amer >60 >60 mL/min   Anion gap 8 5 - 15  CBC  Result Value Ref Range   WBC 6.9 4.0 - 10.5 K/uL   RBC 4.81 4.22 - 5.81 MIL/uL   Hemoglobin 12.7 (L) 13.0 - 17.0 g/dL   HCT 39.7 39.0 - 52.0 %   MCV 82.5 78.0 - 100.0 fL   MCH 26.4 26.0 - 34.0 pg   MCHC 32.0 30.0 - 36.0 g/dL   RDW 12.9 11.5 - 15.5 %   Platelets 349 150 - 400 K/uL  Urinalysis, Routine w reflex microscopic  Result Value Ref Range   Color, Urine YELLOW YELLOW   APPearance CLEAR CLEAR   Specific Gravity, Urine 1.015 1.005 - 1.030   pH 5.5 5.0 - 8.0   Glucose, UA 500 (A) NEGATIVE mg/dL   Hgb urine dipstick NEGATIVE NEGATIVE   Bilirubin Urine NEGATIVE NEGATIVE   Ketones, ur NEGATIVE NEGATIVE mg/dL   Protein, ur NEGATIVE NEGATIVE mg/dL   Nitrite NEGATIVE NEGATIVE   Leukocytes, UA NEGATIVE NEGATIVE  CBG monitoring, ED  Result Value Ref Range   Glucose-Capillary 325 (H) 65 - 99 mg/dL  CBG monitoring, ED  Result Value Ref Range   Glucose-Capillary 165 (H) 65 - 99 mg/dL  Diabetic Labs (most recent): Lab Results  Component Value Date   HGBA1C 10.1 (H) 12/01/2016   HGBA1C 9.2 (H) 06/29/2016   HGBA1C 8.9 (H) 01/30/2016     Assessment & Plan:   1. Uncontrolled type 2 diabetes mellitus without complication, with long-term current use of insulin ([place])  Patient came with persistently elevated glucose profile, and  recent A1c  increasing to  10.1% from 9.2% . - This is due to his recent dietary change advised by his church has no protein and very heavy carbs.   Glucose logs and insulin administration records pertaining to this visit,  to be scanned into patient's records.  Recent labs reviewed. - Patient remains at a high risk for more acute and chronic complications of diabetes which include CAD, CVA, CKD, retinopathy, and neuropathy. These are all discussed in detail with the patient.  - I have re-counseled the patient on diet management and weight loss  by adopting a carbohydrate restricted / protein rich   Diet. - Patient is advised to stick to a routine mealtimes to eat 3 meals  a day and avoid unnecessary snacks ( to snack only to correct hypoglycemia).  - Suggestion is made for patient to avoid simple carbohydrates  from their diet including Cakes , Desserts, Ice Cream,  Soda (  diet and regular) , Sweet Tea , Candies,  Chips, Cookies, Artificial Sweeteners,   and "Sugar-free" Products .  This will help patient to have stable blood glucose profile and potentially avoid unintended  Weight gain.  - The patient  has been  scheduled with Jearld Fenton, RDN, CDE for individualized DM education. - I have approached patient with the following individualized plan to manage diabetes and patient agrees.  - Currently he is on a diet program advised by this church which has no protein whatsoever and only carbs which made him lose control in his glycemia and A1c is higher at 10.1%.  - I will increase his Humalog 75/25 to 40 units BID for premeal glucose of 90 or above. -He will continue  strict monitoring of glucose AC and HS.  -Patient is warned not to take insulin without proper monitoring per orders.  -Patient is encouraged to call clinic for blood glucose levels less than 70 or above 300 mg /dl.  -For better insulin sensitivity I will continue MTF at 1036m po BID. - Januvia  was discontinued due to cost. - I have increased his Victoza to 1.2 mg daily, SE and precaution discussed with him. He is asking for rx for Saxenda. - I asked him to balance his diet with more protein and less carbs if this is not contracting his belief.  - Patient specific target  for A1c; LDL, HDL, Triglycerides, and  Waist Circumference were discussed in detail.  2) BP/HTN: uncontrolled. Continue current medications including ACEI. I advised him to be consistent in taking his blood pressure medications in the morning. 3) Lipids/HPL: continue statins. 4)  Weight/Diet: CDE consult in progress, exercise, and carbohydrates  information provided.  5) Chronic Care/Health Maintenance:  -Patient is on ACEI and Statin medications and encouraged to continue to follow up with Ophthalmology, Podiatrist at least yearly or according to recommendations, and advised to stay away from smoking. I have recommended yearly flu vaccine and pneumonia vaccination at least every 5 years; moderate intensity exercise for up to 150 minutes weekly; and  sleep for at least 7 hours a day.  4. Vitamin D deficiency - He is status post therapy with vitamin D 50,000 units weekly for 12 weeks.  I advised patient to maintain close follow up with their PCP for primary care needs.  Patient is asked to bring meter and  blood glucose logs during their next visit.   Follow up plan: Return in about 3 months (around 03/21/2017) for follow up with pre-visit labs, meter, and logs.  GGlade Lloyd MD Phone: [phone] Fax: [phone]  12/21/2016, 8:54 AM

## 2016-12-26 ENCOUNTER — Other Ambulatory Visit: Payer: Self-pay | Admitting: "Endocrinology

## 2016-12-27 DIAGNOSIS — Z1389 Encounter for screening for other disorder: Secondary | ICD-10-CM | POA: Diagnosis not present

## 2016-12-27 DIAGNOSIS — I10 Essential (primary) hypertension: Secondary | ICD-10-CM | POA: Diagnosis not present

## 2016-12-27 DIAGNOSIS — J449 Chronic obstructive pulmonary disease, unspecified: Secondary | ICD-10-CM | POA: Diagnosis not present

## 2016-12-27 DIAGNOSIS — E669 Obesity, unspecified: Secondary | ICD-10-CM | POA: Diagnosis not present

## 2016-12-27 DIAGNOSIS — Z23 Encounter for immunization: Secondary | ICD-10-CM | POA: Diagnosis not present

## 2016-12-27 DIAGNOSIS — E1165 Type 2 diabetes mellitus with hyperglycemia: Secondary | ICD-10-CM | POA: Diagnosis not present

## 2016-12-27 DIAGNOSIS — Z0000 Encounter for general adult medical examination without abnormal findings: Secondary | ICD-10-CM | POA: Diagnosis not present

## 2016-12-27 DIAGNOSIS — G894 Chronic pain syndrome: Secondary | ICD-10-CM | POA: Diagnosis not present

## 2016-12-27 DIAGNOSIS — Z6838 Body mass index (BMI) 38.0-38.9, adult: Secondary | ICD-10-CM | POA: Diagnosis not present

## 2016-12-27 DIAGNOSIS — E782 Mixed hyperlipidemia: Secondary | ICD-10-CM | POA: Diagnosis not present

## 2017-01-07 ENCOUNTER — Other Ambulatory Visit: Payer: Self-pay

## 2017-01-07 MED ORDER — LIRAGLUTIDE 18 MG/3ML ~~LOC~~ SOPN: 1.2000 mg | PEN_INJECTOR | Freq: Every day | SUBCUTANEOUS | 2 refills | Status: DC

## 2017-01-25 ENCOUNTER — Other Ambulatory Visit: Payer: Self-pay | Admitting: "Endocrinology

## 2017-01-26 ENCOUNTER — Other Ambulatory Visit: Payer: Self-pay | Admitting: "Endocrinology

## 2017-02-24 DIAGNOSIS — I10 Essential (primary) hypertension: Secondary | ICD-10-CM | POA: Diagnosis not present

## 2017-02-24 DIAGNOSIS — G894 Chronic pain syndrome: Secondary | ICD-10-CM | POA: Diagnosis not present

## 2017-02-24 DIAGNOSIS — Z23 Encounter for immunization: Secondary | ICD-10-CM | POA: Diagnosis not present

## 2017-02-24 DIAGNOSIS — J449 Chronic obstructive pulmonary disease, unspecified: Secondary | ICD-10-CM | POA: Diagnosis not present

## 2017-02-24 DIAGNOSIS — Z6839 Body mass index (BMI) 39.0-39.9, adult: Secondary | ICD-10-CM | POA: Diagnosis not present

## 2017-02-24 DIAGNOSIS — E782 Mixed hyperlipidemia: Secondary | ICD-10-CM | POA: Diagnosis not present

## 2017-02-24 DIAGNOSIS — E1165 Type 2 diabetes mellitus with hyperglycemia: Secondary | ICD-10-CM | POA: Diagnosis not present

## 2017-02-25 ENCOUNTER — Other Ambulatory Visit: Payer: Self-pay

## 2017-02-25 MED ORDER — PEN NEEDLES 31G X 5 MM MISC: 1.0000 | Freq: Three times a day (TID) | 5 refills | Status: DC

## 2017-03-03 DIAGNOSIS — E1165 Type 2 diabetes mellitus with hyperglycemia: Secondary | ICD-10-CM | POA: Diagnosis not present

## 2017-03-16 DIAGNOSIS — E1165 Type 2 diabetes mellitus with hyperglycemia: Secondary | ICD-10-CM | POA: Diagnosis not present

## 2017-03-16 DIAGNOSIS — Z794 Long term (current) use of insulin: Secondary | ICD-10-CM | POA: Diagnosis not present

## 2017-03-17 LAB — CMP14+EGFR
ALT: 8 IU/L (ref 0–44)
AST: 14 IU/L (ref 0–40)
Albumin/Globulin Ratio: 1.9 (ref 1.2–2.2)
Albumin: 4.6 g/dL (ref 3.6–4.8)
Alkaline Phosphatase: 70 IU/L (ref 39–117)
BILIRUBIN TOTAL: 0.2 mg/dL (ref 0.0–1.2)
BUN/Creatinine Ratio: 15 (ref 10–24)
BUN: 16 mg/dL (ref 8–27)
CHLORIDE: 100 mmol/L (ref 96–106)
CO2: 25 mmol/L (ref 18–29)
Calcium: 9.7 mg/dL (ref 8.6–10.2)
Creatinine, Ser: 1.1 mg/dL (ref 0.76–1.27)
GFR calc non Af Amer: 70 mL/min/{1.73_m2} (ref >59)
GFR, EST AFRICAN AMERICAN: 81 mL/min/{1.73_m2} (ref >59)
GLUCOSE: 92 mg/dL (ref 65–99)
Globulin, Total: 2.4 g/dL (ref 1.5–4.5)
Potassium: 4.6 mmol/L (ref 3.5–5.2)
Sodium: 142 mmol/L (ref 134–144)
TOTAL PROTEIN: 7 g/dL (ref 6.0–8.5)

## 2017-03-17 LAB — HEMOGLOBIN A1C
Est. average glucose Bld gHb Est-mCnc: 203 mg/dL
Hgb A1c MFr Bld: 8.7 % — ABNORMAL HIGH (ref 4.8–5.6)

## 2017-03-24 ENCOUNTER — Encounter: Payer: Self-pay | Admitting: "Endocrinology

## 2017-03-24 ENCOUNTER — Ambulatory Visit (INDEPENDENT_AMBULATORY_CARE_PROVIDER_SITE_OTHER): Payer: PPO | Admitting: "Endocrinology

## 2017-03-24 VITALS — BP 114/74 | HR 82 | Ht 64.0 in | Wt 228.0 lb

## 2017-03-24 DIAGNOSIS — I10 Essential (primary) hypertension: Secondary | ICD-10-CM | POA: Diagnosis not present

## 2017-03-24 DIAGNOSIS — Z794 Long term (current) use of insulin: Secondary | ICD-10-CM

## 2017-03-24 DIAGNOSIS — E1165 Type 2 diabetes mellitus with hyperglycemia: Secondary | ICD-10-CM | POA: Diagnosis not present

## 2017-03-24 DIAGNOSIS — IMO0001 Reserved for inherently not codable concepts without codable children: Secondary | ICD-10-CM

## 2017-03-24 DIAGNOSIS — E782 Mixed hyperlipidemia: Secondary | ICD-10-CM | POA: Diagnosis not present

## 2017-03-24 MED ORDER — INSULIN LISPRO PROT & LISPRO (75-25 MIX) 100 UNIT/ML KWIKPEN: 40.0000 [IU] | PEN_INJECTOR | Freq: Two times a day (BID) | SUBCUTANEOUS | 2 refills | Status: DC

## 2017-03-24 MED ORDER — LIRAGLUTIDE 18 MG/3ML ~~LOC~~ SOPN: 1.8000 mg | PEN_INJECTOR | Freq: Every day | SUBCUTANEOUS | 2 refills | Status: DC

## 2017-03-24 NOTE — Progress Notes (Signed)
Subjective:    Patient ID: Patrick Taylor, male    DOB: [birth year],    Past Medical History:  Diagnosis Date  . Anemia   . Asthma   . Carpal tunnel syndrome   . Chronic back pain   . Colon polyps   . DM (diabetes mellitus) ([place])   . HTN (hypertension)   . Hyperlipidemia    Past Surgical History:  Procedure Laterality Date  . ABDOMINAL HERNIA REPAIR    . COLONOSCOPY  01/24/09   rectal polyp/adenomatous poylps/tubulovillous  . COLONOSCOPY  03/02/2012   Procedure: COLONOSCOPY;  Surgeon: Daneil Dolin, MD;  Location: [facility];  Service: Endoscopy;  Laterality: N/A;  11:00   Social History   Social History  . Marital status: Married    Spouse name: N/A  . Number of children: N/A  . Years of education: N/A   Occupational History  . works at [employer]  . Smoking status: Former Smoker    Quit date: 02/18/1999  . Smokeless tobacco: Never Used  . Alcohol use No     Comment: no heavy use in past  . Drug use: No  . Sexual activity: Not Asked   Other Topics Concern  . None   Social History Narrative  . None   Outpatient Encounter Prescriptions as of 03/24/2017  Medication Sig  . aspirin 81 MG tablet Take 81 mg by mouth daily.  . budesonide-formoterol (SYMBICORT) 80-4.5 MCG/ACT inhaler Inhale 2 puffs into the lungs 2 (two) times daily as needed (shortness of breath).   . cyclobenzaprine (FLEXERIL) 10 MG tablet Take 10 mg by mouth 3 (three) times daily as needed for muscle spasms.   . enalapril (VASOTEC) 20 MG tablet Take 1 tablet (20 mg total) by mouth daily.  . furosemide (LASIX) 20 MG tablet Take 20 mg by mouth daily.   [name] Kitchen gabapentin (NEURONTIN) 300 MG capsule Take 300 mg by mouth 3 (three) times daily.   [name] Kitchen HYDROcodone-acetaminophen (NORCO) 7.5-325 MG tablet Take 1 tablet by mouth 2 (two) times daily as needed for moderate pain.  . Insulin Lispro Prot & Lispro (HUMALOG MIX 75/25 KWIKPEN) (75-25) 100 UNIT/ML Kwikpen Inject 40 Units  into the skin 2 (two) times daily with a meal.  . Insulin Pen Needle (PEN NEEDLES) 31G X 5 MM MISC 1 each by Does not apply route 3 (three) times daily.  [name] Kitchen liraglutide (VICTOZA) 18 MG/3ML SOPN Inject 0.3 mLs (1.8 mg total) into the skin daily before supper.  . liraglutide 18 MG/3ML SOPN Inject 0.2 mLs (1.2 mg total) into the skin daily before supper.  . metFORMIN (GLUCOPHAGE) 1000 MG tablet Take 1,000 mg by mouth 2 (two) times daily with a meal.  . ONE TOUCH ULTRA TEST test strip USE TO TEST BLOOD SUGAR 4 TIMES DAILY  . pravastatin (PRAVACHOL) 20 MG tablet Take 20 mg by mouth daily.  . Vitamin D, Ergocalciferol, (DRISDOL) 50000 units CAPS capsule TAKE ONE CAPSULE BY MOUTH EVERY WEEK  . [DISCONTINUED] Insulin Lispro Prot & Lispro (HUMALOG MIX 75/25 KWIKPEN) (75-25) 100 UNIT/ML Kwikpen Inject 40 Units into the skin 2 (two) times daily with a meal.  . [DISCONTINUED] liraglutide (VICTOZA) 18 MG/3ML SOPN Inject 0.2 mLs (1.2 mg total) into the skin daily before supper.   No facility-administered encounter medications on file as of 03/24/2017.    ALLERGIES: No Known Allergies VACCINATION STATUS:  There is no immunization history on file for this patient.  Diabetes  He presents for his follow-up diabetic visit. He has type 2 diabetes mellitus. Onset time: He was diagnosed at approximate age of 62 years. His disease course has been improving. There are no hypoglycemic associated symptoms. Pertinent negatives for hypoglycemia include no confusion, headaches, pallor or seizures. Associated symptoms include polydipsia and polyuria. Pertinent negatives for diabetes include no chest pain, no fatigue, no polyphagia and no weakness. There are no hypoglycemic complications. Symptoms are improving. Risk factors for coronary artery disease include dyslipidemia, diabetes mellitus, hypertension and sedentary lifestyle. Current diabetic treatment includes oral agent (monotherapy). He is compliant with treatment most of  the time. His weight is increasing steadily. He is following a generally unhealthy diet. He never participates in exercise. His breakfast blood glucose range is generally 180-200 mg/dl. His dinner blood glucose range is generally 180-200 mg/dl. His overall blood glucose range is 180-200 mg/dl. An ACE inhibitor/angiotensin II receptor blocker is being taken. Eye exam is current.  Hyperlipidemia  This is a chronic problem. The current episode started more than 1 year ago. Pertinent negatives include no chest pain, myalgias or shortness of breath. Current antihyperlipidemic treatment includes statins.  Hypertension  This is a chronic problem. The current episode started more than 1 year ago. Pertinent negatives include no chest pain, headaches, neck pain, palpitations or shortness of breath. Past treatments include ACE inhibitors.     Review of Systems  Constitutional: Negative for fatigue and unexpected weight change.  HENT: Negative for dental problem, mouth sores and trouble swallowing.   Eyes: Negative for visual disturbance.  Respiratory: Negative for cough, choking, chest tightness, shortness of breath and wheezing.   Cardiovascular: Negative for chest pain, palpitations and leg swelling.  Gastrointestinal: Negative for abdominal distention, abdominal pain, constipation, diarrhea, nausea and vomiting.  Endocrine: Positive for polydipsia and polyuria. Negative for polyphagia.  Genitourinary: Negative for dysuria, flank pain, hematuria and urgency.  Musculoskeletal: Negative for back pain, gait problem, myalgias and neck pain.  Skin: Negative for pallor, rash and wound.  Neurological: Negative for seizures, syncope, weakness, numbness and headaches.  Psychiatric/Behavioral: Negative.  Negative for confusion and dysphoric mood.    Objective:    BP 114/74   Pulse 82   Ht '5\' 4"'  (1.626 m)   Wt 228 lb (103.4 kg)   BMI 39.14 kg/m   Wt Readings from Last 3 Encounters:  03/24/17 228 lb  (103.4 kg)  12/21/16 226 lb (102.5 kg)  08/27/16 224 lb (101.6 kg)    Physical Exam  Constitutional: He is oriented to person, place, and time. He appears well-developed and well-nourished. He is cooperative. No distress.  HENT:  Head: Normocephalic and atraumatic.  Eyes: EOM are normal.  Neck: Normal range of motion. Neck supple. No tracheal deviation present. No thyromegaly present.  Cardiovascular: Normal rate, S1 normal, S2 normal and normal heart sounds.  Exam reveals no gallop.   No murmur heard. Pulses:      Dorsalis pedis pulses are 1+ on the right side, and 1+ on the left side.       Posterior tibial pulses are 1+ on the right side, and 1+ on the left side.  Pulmonary/Chest: Breath sounds normal. No respiratory distress. He has no wheezes.  Abdominal: Soft. Bowel sounds are normal. He exhibits no distension. There is no tenderness. There is no guarding and no CVA tenderness.  Musculoskeletal: He exhibits no edema.       Right shoulder: He exhibits no swelling and no deformity.  Neurological: He is alert and  oriented to person, place, and time. He has normal strength and normal reflexes. No cranial nerve deficit or sensory deficit. Gait normal.  Skin: Skin is warm and dry. No rash noted. No cyanosis. Nails show no clubbing.  Psychiatric: He has a normal mood and affect. His speech is normal and behavior is normal. Judgment and thought content normal. Cognition and memory are normal.    Results for orders placed or performed in visit on 12/21/16  CMP14+EGFR  Result Value Ref Range   Glucose 92 65 - 99 mg/dL   BUN 16 8 - 27 mg/dL   Creatinine, Ser 1.10 0.76 - 1.27 mg/dL   GFR calc non Af Amer 70 >59 mL/min/1.73   GFR calc Af Amer 81 >59 mL/min/1.73   BUN/Creatinine Ratio 15 10 - 24   Sodium 142 134 - 144 mmol/L   Potassium 4.6 3.5 - 5.2 mmol/L   Chloride 100 96 - 106 mmol/L   CO2 25 18 - 29 mmol/L   Calcium 9.7 8.6 - 10.2 mg/dL   Total Protein 7.0 6.0 - 8.5 g/dL   Albumin  4.6 3.6 - 4.8 g/dL   Globulin, Total 2.4 1.5 - 4.5 g/dL   Albumin/Globulin Ratio 1.9 1.2 - 2.2   Bilirubin Total 0.2 0.0 - 1.2 mg/dL   Alkaline Phosphatase 70 39 - 117 IU/L   AST 14 0 - 40 IU/L   ALT 8 0 - 44 IU/L  Hemoglobin A1c  Result Value Ref Range   Hgb A1c MFr Bld 8.7 (H) 4.8 - 5.6 %   Est. average glucose Bld gHb Est-mCnc 203 mg/dL  Diabetic Labs (most recent): Lab Results  Component Value Date   HGBA1C 8.7 (H) 03/16/2017   HGBA1C 10.1 (H) 12/01/2016   HGBA1C 9.2 (H) 06/29/2016     Assessment & Plan:   1. Uncontrolled type 2 diabetes mellitus without complication, with long-term current use of insulin ([place])  Patient came with Improving but still above target glucose profile, and  recent A1c  improving to 8.7% from  10.1%.   Glucose logs and insulin administration records pertaining to this visit,  to be scanned into patient's records.  Recent labs reviewed. - Patient remains at a high risk for more acute and chronic complications of diabetes which include CAD, CVA, CKD, retinopathy, and neuropathy. These are all discussed in detail with the patient.  - I have re-counseled the patient on diet management and weight loss  by adopting a carbohydrate restricted / protein rich  Diet. - Patient is advised to stick to a routine mealtimes to eat 3 meals  a day and avoid unnecessary snacks ( to snack only to correct hypoglycemia).  - Suggestion is made for patient to avoid simple carbohydrates   from their diet including Cakes , Desserts, Ice Cream,  Soda (  diet and regular) , Sweet Tea , Candies,  Chips, Cookies, Artificial Sweeteners,   and "Sugar-free" Products .  This will help patient to have stable blood glucose profile and potentially avoid unintended  Weight gain.  - The patient  has been  scheduled with Jearld Fenton, RDN, CDE for individualized DM education. - I have approached patient with the following individualized plan to manage diabetes and patient agrees.  - I  will continue  his Humalog 75/25  at 40 units BID for premeal glucose of 90 or above. -He will continue  strict monitoring of glucose AC and HS.  -Patient is warned not to take insulin without proper monitoring per  orders.  -Patient is encouraged to call clinic for blood glucose levels less than 70 or above 300 mg /dl.  -For better insulin sensitivity I will continue MTF at 1036m po BID. - Januvia  was discontinued due to cost. - I would increase his Victoza to 1.8 mg daily, SE and precaution discussed with him. He is asking for rx for Saxenda. - I asked him to balance his diet with more protein and less carbs if this is not contracting his belief.  - Patient specific target  for A1c; LDL, HDL, Triglycerides, and  Waist Circumference were discussed in detail.  2) BP/HTN: controlled. Continue current medications including ACEI. I advised him to be consistent in taking his blood pressure medications in the morning. 3) Lipids/HPL: continue statins. 4)  Weight/Diet: CDE consult in progress, exercise, and carbohydrates information provided.  5) Chronic Care/Health Maintenance:  -Patient is on ACEI and Statin medications and encouraged to continue to follow up with Ophthalmology, Podiatrist at least yearly or according to recommendations, and advised to stay away from smoking. I have recommended yearly flu vaccine and pneumonia vaccination at least every 5 years; moderate intensity exercise for up to 150 minutes weekly; and  sleep for at least 7 hours a day.  4. Vitamin D deficiency - He is status post therapy with vitamin D 50,000 units weekly for 12 weeks.  I advised patient to maintain close follow up with their PCP for primary care needs.  Patient is asked to bring meter and  blood glucose logs during their next visit.   Follow up plan: Return in about 3 months (around 06/23/2017) for follow up with pre-visit labs, meter, and logs.  GGlade Lloyd MD Phone: [phone] Fax: [phone]   03/24/2017, 10:42 AM

## 2017-03-24 NOTE — Patient Instructions (Signed)

## 2017-03-30 ENCOUNTER — Other Ambulatory Visit: Payer: Self-pay | Admitting: "Endocrinology

## 2017-03-30 MED ORDER — INSULIN ASPART PROT & ASPART (70-30 MIX) 100 UNIT/ML PEN: 40.0000 [IU] | PEN_INJECTOR | Freq: Two times a day (BID) | SUBCUTANEOUS | 2 refills | Status: DC

## 2017-04-05 ENCOUNTER — Other Ambulatory Visit: Payer: Self-pay

## 2017-04-05 MED ORDER — PEN NEEDLES 31G X 5 MM MISC: 1.0000 | Freq: Three times a day (TID) | 5 refills | Status: DC

## 2017-04-12 ENCOUNTER — Telehealth: Payer: Self-pay | Admitting: "Endocrinology

## 2017-04-12 MED ORDER — LIRAGLUTIDE 18 MG/3ML ~~LOC~~ SOPN: 1.8000 mg | PEN_INJECTOR | Freq: Every day | SUBCUTANEOUS | 2 refills | Status: DC

## 2017-04-12 NOTE — Telephone Encounter (Signed)
Patrick Taylor is asking for a refill on his liraglutide (VICTOZA) 18 MG/3ML SOPN please advise?

## 2017-04-17 ENCOUNTER — Other Ambulatory Visit: Payer: Self-pay | Admitting: "Endocrinology

## 2017-04-18 ENCOUNTER — Other Ambulatory Visit: Payer: Self-pay | Admitting: "Endocrinology

## 2017-04-26 DIAGNOSIS — E782 Mixed hyperlipidemia: Secondary | ICD-10-CM | POA: Diagnosis not present

## 2017-04-26 DIAGNOSIS — E1129 Type 2 diabetes mellitus with other diabetic kidney complication: Secondary | ICD-10-CM | POA: Diagnosis not present

## 2017-04-26 DIAGNOSIS — Z79891 Long term (current) use of opiate analgesic: Secondary | ICD-10-CM | POA: Diagnosis not present

## 2017-04-26 DIAGNOSIS — Z6839 Body mass index (BMI) 39.0-39.9, adult: Secondary | ICD-10-CM | POA: Diagnosis not present

## 2017-04-26 DIAGNOSIS — G894 Chronic pain syndrome: Secondary | ICD-10-CM | POA: Diagnosis not present

## 2017-04-26 DIAGNOSIS — J449 Chronic obstructive pulmonary disease, unspecified: Secondary | ICD-10-CM | POA: Diagnosis not present

## 2017-04-26 DIAGNOSIS — I10 Essential (primary) hypertension: Secondary | ICD-10-CM | POA: Diagnosis not present

## 2017-06-15 ENCOUNTER — Other Ambulatory Visit: Payer: Self-pay | Admitting: "Endocrinology

## 2017-06-20 ENCOUNTER — Other Ambulatory Visit: Payer: Self-pay

## 2017-06-20 MED ORDER — INSULIN ASPART PROT & ASPART (70-30 MIX) 100 UNIT/ML PEN: PEN_INJECTOR | SUBCUTANEOUS | 2 refills | Status: DC

## 2017-06-22 DIAGNOSIS — E1165 Type 2 diabetes mellitus with hyperglycemia: Secondary | ICD-10-CM | POA: Diagnosis not present

## 2017-06-22 DIAGNOSIS — Z794 Long term (current) use of insulin: Secondary | ICD-10-CM | POA: Diagnosis not present

## 2017-06-23 LAB — CMP14+EGFR
A/G RATIO: 1.6 (ref 1.2–2.2)
ALT: 7 IU/L (ref 0–44)
AST: 17 IU/L (ref 0–40)
Albumin: 4.5 g/dL (ref 3.6–4.8)
Alkaline Phosphatase: 69 IU/L (ref 39–117)
BILIRUBIN TOTAL: 0.4 mg/dL (ref 0.0–1.2)
BUN/Creatinine Ratio: 16 (ref 10–24)
BUN: 17 mg/dL (ref 8–27)
CHLORIDE: 99 mmol/L (ref 96–106)
CO2: 24 mmol/L (ref 20–29)
Calcium: 9.7 mg/dL (ref 8.6–10.2)
Creatinine, Ser: 1.08 mg/dL (ref 0.76–1.27)
GFR calc non Af Amer: 72 mL/min/{1.73_m2} (ref >59)
GFR, EST AFRICAN AMERICAN: 83 mL/min/{1.73_m2} (ref >59)
GLOBULIN, TOTAL: 2.8 g/dL (ref 1.5–4.5)
Glucose: 184 mg/dL — ABNORMAL HIGH (ref 65–99)
POTASSIUM: 4.9 mmol/L (ref 3.5–5.2)
SODIUM: 138 mmol/L (ref 134–144)
TOTAL PROTEIN: 7.3 g/dL (ref 6.0–8.5)

## 2017-06-27 ENCOUNTER — Other Ambulatory Visit: Payer: Self-pay | Admitting: "Endocrinology

## 2017-06-27 ENCOUNTER — Ambulatory Visit: Payer: PPO | Admitting: "Endocrinology

## 2017-06-27 DIAGNOSIS — E1165 Type 2 diabetes mellitus with hyperglycemia: Secondary | ICD-10-CM | POA: Diagnosis not present

## 2017-06-28 LAB — HGB A1C W/O EAG: Hgb A1c MFr Bld: 8 % — ABNORMAL HIGH (ref 4.8–5.6)

## 2017-06-29 ENCOUNTER — Other Ambulatory Visit: Payer: Self-pay

## 2017-06-29 MED ORDER — GLUCOSE BLOOD VI STRP: ORAL_STRIP | 5 refills | Status: DC

## 2017-06-29 MED ORDER — ACCU-CHEK AVIVA PLUS W/DEVICE KIT: PACK | 0 refills | Status: DC

## 2017-07-01 ENCOUNTER — Encounter: Payer: Self-pay | Admitting: "Endocrinology

## 2017-07-01 ENCOUNTER — Ambulatory Visit (INDEPENDENT_AMBULATORY_CARE_PROVIDER_SITE_OTHER): Payer: Medicare HMO | Admitting: "Endocrinology

## 2017-07-01 DIAGNOSIS — E559 Vitamin D deficiency, unspecified: Secondary | ICD-10-CM | POA: Diagnosis not present

## 2017-07-01 DIAGNOSIS — I10 Essential (primary) hypertension: Secondary | ICD-10-CM | POA: Diagnosis not present

## 2017-07-01 DIAGNOSIS — E1165 Type 2 diabetes mellitus with hyperglycemia: Secondary | ICD-10-CM | POA: Diagnosis not present

## 2017-07-01 DIAGNOSIS — Z794 Long term (current) use of insulin: Secondary | ICD-10-CM | POA: Diagnosis not present

## 2017-07-01 DIAGNOSIS — IMO0001 Reserved for inherently not codable concepts without codable children: Secondary | ICD-10-CM

## 2017-07-01 DIAGNOSIS — E782 Mixed hyperlipidemia: Secondary | ICD-10-CM

## 2017-07-01 MED ORDER — INSULIN ASPART PROT & ASPART (70-30 MIX) 100 UNIT/ML PEN: PEN_INJECTOR | SUBCUTANEOUS | 2 refills | Status: DC

## 2017-07-01 NOTE — Patient Instructions (Signed)

## 2017-07-01 NOTE — Progress Notes (Signed)
Subjective:    Patient ID: Patrick Taylor, male    DOB: 1951/04/08,    Past Medical History:  Diagnosis Date  . Anemia   . Asthma   . Carpal tunnel syndrome   . Chronic back pain   . Colon polyps   . DM (diabetes mellitus) ([place])   . HTN (hypertension)   . Hyperlipidemia    Past Surgical History:  Procedure Laterality Date  . ABDOMINAL HERNIA REPAIR    . COLONOSCOPY  01/24/09   rectal polyp/adenomatous poylps/tubulovillous  . COLONOSCOPY  03/02/2012   Procedure: COLONOSCOPY;  Surgeon: Daneil Dolin, MD;  Location: [facility];  Service: Endoscopy;  Laterality: N/A;  11:00   Social History   Social History  . Marital status: Married    Spouse name: N/A  . Number of children: N/A  . Years of education: N/A   Occupational History  . works at [employer]  . Smoking status: Former Smoker    Quit date: 02/18/1999  . Smokeless tobacco: Never Used  . Alcohol use No     Comment: no heavy use in past  . Drug use: No  . Sexual activity: Not on file   Other Topics Concern  . Not on file   Social History Narrative  . No narrative on file   Outpatient Encounter Prescriptions as of 07/01/2017  Medication Sig  . aspirin 81 MG tablet Take 81 mg by mouth daily.  . Blood Glucose Monitoring Suppl (ACCU-CHEK AVIVA PLUS) w/Device KIT Use as directed bid E11.65  . budesonide-formoterol (SYMBICORT) 80-4.5 MCG/ACT inhaler Inhale 2 puffs into the lungs 2 (two) times daily as needed (shortness of breath).   . cyclobenzaprine (FLEXERIL) 10 MG tablet Take 10 mg by mouth 3 (three) times daily as needed for muscle spasms.   . enalapril (VASOTEC) 20 MG tablet Take 1 tablet (20 mg total) by mouth daily.  . furosemide (LASIX) 20 MG tablet Take 20 mg by mouth daily.   [name] Kitchen gabapentin (NEURONTIN) 300 MG capsule Take 300 mg by mouth 3 (three) times daily.   [name] Kitchen glucose blood (ACCU-CHEK AVIVA) test strip Use as instructed bid  . HYDROcodone-acetaminophen (NORCO)  7.5-325 MG tablet Take 1 tablet by mouth 2 (two) times daily as needed for moderate pain.  [name] Kitchen insulin aspart protamine - aspart (NOVOLOG MIX 70/30 FLEXPEN) (70-30) 100 UNIT/ML FlexPen INJECT 35 UNITS INTO THE SKIN 2 TIMES DAILY.  [name] Kitchen Insulin Pen Needle (PEN NEEDLES) 31G X 5 MM MISC 1 each by Does not apply route 3 (three) times daily.  [name] Kitchen liraglutide (VICTOZA) 18 MG/3ML SOPN Inject 0.3 mLs (1.8 mg total) into the skin daily before supper.  . metFORMIN (GLUCOPHAGE) 1000 MG tablet Take 1,000 mg by mouth 2 (two) times daily with a meal.  . ONE TOUCH ULTRA TEST test strip USE TO TEST BLOOD SUGAR 4 TIMES DAILY  . pravastatin (PRAVACHOL) 20 MG tablet Take 20 mg by mouth daily.  . Vitamin D, Ergocalciferol, (DRISDOL) 50000 units CAPS capsule TAKE ONE CAPSULE BY MOUTH ONCE EVERY WEEK  . [DISCONTINUED] insulin aspart protamine - aspart (NOVOLOG MIX 70/30 FLEXPEN) (70-30) 100 UNIT/ML FlexPen INJECT 40 UNITS INTO THE SKIN 2 TIMES DAILY.   No facility-administered encounter medications on file as of 07/01/2017.    ALLERGIES: No Known Allergies VACCINATION STATUS:  There is no immunization history on file for this patient.  Diabetes  He presents for his follow-up diabetic visit. He has type  2 diabetes mellitus. Onset time: He was diagnosed at approximate age of 89 years. His disease course has been improving. There are no hypoglycemic associated symptoms. Pertinent negatives for hypoglycemia include no confusion, headaches, pallor or seizures. Associated symptoms include polydipsia and polyuria. Pertinent negatives for diabetes include no chest pain, no fatigue, no polyphagia and no weakness. There are no hypoglycemic complications. Symptoms are improving. Risk factors for coronary artery disease include dyslipidemia, diabetes mellitus, hypertension and sedentary lifestyle. Current diabetic treatment includes oral agent (monotherapy). He is compliant with treatment most of the time. His weight is increasing  steadily. He is following a generally unhealthy diet. He never participates in exercise. His breakfast blood glucose range is generally 180-200 mg/dl. His dinner blood glucose range is generally 180-200 mg/dl. His overall blood glucose range is 180-200 mg/dl. An ACE inhibitor/angiotensin II receptor blocker is being taken. Eye exam is current.  Hyperlipidemia  This is a chronic problem. The current episode started more than 1 year ago. Pertinent negatives include no chest pain, myalgias or shortness of breath. Current antihyperlipidemic treatment includes statins.  Hypertension  This is a chronic problem. The current episode started more than 1 year ago. Pertinent negatives include no chest pain, headaches, neck pain, palpitations or shortness of breath. Past treatments include ACE inhibitors.     Review of Systems  Constitutional: Negative for fatigue and unexpected weight change.  HENT: Negative for dental problem, mouth sores and trouble swallowing.   Eyes: Negative for visual disturbance.  Respiratory: Negative for cough, choking, chest tightness, shortness of breath and wheezing.   Cardiovascular: Negative for chest pain, palpitations and leg swelling.  Gastrointestinal: Negative for abdominal distention, abdominal pain, constipation, diarrhea, nausea and vomiting.  Endocrine: Positive for polydipsia and polyuria. Negative for polyphagia.  Genitourinary: Negative for dysuria, flank pain, hematuria and urgency.  Musculoskeletal: Negative for back pain, gait problem, myalgias and neck pain.  Skin: Negative for pallor, rash and wound.  Neurological: Negative for seizures, syncope, weakness, numbness and headaches.  Psychiatric/Behavioral: Negative.  Negative for confusion and dysphoric mood.    Objective:    There were no vitals taken for this visit.  Wt Readings from Last 3 Encounters:  03/24/17 228 lb (103.4 kg)  12/21/16 226 lb (102.5 kg)  08/27/16 224 lb (101.6 kg)    Physical  Exam  Constitutional: He is oriented to person, place, and time. He appears well-developed and well-nourished. He is cooperative. No distress.  HENT:  Head: Normocephalic and atraumatic.  Eyes: EOM are normal.  Neck: Normal range of motion. Neck supple. No tracheal deviation present. No thyromegaly present.  Cardiovascular: Normal rate, S1 normal, S2 normal and normal heart sounds.  Exam reveals no gallop.   No murmur heard. Pulses:      Dorsalis pedis pulses are 1+ on the right side, and 1+ on the left side.       Posterior tibial pulses are 1+ on the right side, and 1+ on the left side.  Pulmonary/Chest: Breath sounds normal. No respiratory distress. He has no wheezes.  Abdominal: Soft. Bowel sounds are normal. He exhibits no distension. There is no tenderness. There is no guarding and no CVA tenderness.  Musculoskeletal: He exhibits no edema.       Right shoulder: He exhibits no swelling and no deformity.  Neurological: He is alert and oriented to person, place, and time. He has normal strength and normal reflexes. No cranial nerve deficit or sensory deficit. Gait normal.  Skin: Skin is warm and  dry. No rash noted. No cyanosis. Nails show no clubbing.  Psychiatric: He has a normal mood and affect. His speech is normal and behavior is normal. Judgment and thought content normal. Cognition and memory are normal.    Results for orders placed or performed in visit on 06/27/17  Hgb A1c w/o eAG  Result Value Ref Range   Hgb A1c MFr Bld 8.0 (H) 4.8 - 5.6 %  Diabetic Labs (most recent): Lab Results  Component Value Date   HGBA1C 8.0 (H) 06/27/2017   HGBA1C 8.7 (H) 03/16/2017   HGBA1C 10.1 (H) 12/01/2016     Assessment & Plan:   1. Uncontrolled type 2 diabetes mellitus without complication, with long-term current use of insulin ([place])  Patient came with Improving but still above target glucose profile, and  recent A1c  improving to 8% , Progressively improving from  10.1%.   Glucose  logs and insulin administration records pertaining to this visit,  to be scanned into patient's records.  Recent labs reviewed. - Patient remains at a high risk for more acute and chronic complications of diabetes which include CAD, CVA, CKD, retinopathy, and neuropathy. These are all discussed in detail with the patient.  - I have re-counseled the patient on diet management and weight loss  by adopting a carbohydrate restricted / protein rich  Diet. - Patient is advised to stick to a routine mealtimes to eat 3 meals  a day and avoid unnecessary snacks ( to snack only to correct hypoglycemia).  - Suggestion is made for patient to avoid simple carbohydrates   from his diet including Cakes , Desserts, Ice Cream,  Soda (  diet and regular) , Sweet Tea , Candies,  Chips, Cookies, Artificial Sweeteners,   and "Sugar-free" Products .  This will help patient to have stable blood glucose profile and potentially avoid unintended  Weight gain.  - The patient  has been  scheduled with Jearld Fenton, RDN, CDE for individualized DM education. - I have approached patient with the following individualized plan to manage diabetes and patient agrees.  - I will lower NovoLog 70/30-35 units twice a day  for premeal glucose of 90 or above. -He will continue  strict monitoring of glucose AC and HS.  -Patient is warned not to take insulin without proper monitoring per orders.  -Patient is encouraged to call clinic for blood glucose levels less than 70 or above 300 mg /dl.  -For better insulin sensitivity I will continue MTF at 1052m po twice a day with meals. - Januvia  was discontinued due to cost. - I would increase his Victoza to 1.8 mg daily, SE and precaution discussed with him.  - I asked him to balance his diet with more protein and less carbs if this is not contracting his belief.  - Patient specific target  for A1c; LDL, HDL, Triglycerides, and  Waist Circumference were discussed in detail.  2) BP/HTN:  controlled. Continue current medications including ACEI. I advised him to be consistent in taking his blood pressure medications in the morning. 3) Lipids/HPL: continue statins. 4)  Weight/Diet: CDE consult in progress, exercise, and carbohydrates information provided.  5) Chronic Care/Health Maintenance:  -Patient is on ACEI and Statin medications and encouraged to continue to follow up with Ophthalmology, Podiatrist at least yearly or according to recommendations, and advised to stay away from smoking. I have recommended yearly flu vaccine and pneumonia vaccination at least every 5 years; moderate intensity exercise for up to 150 minutes weekly;  and  sleep for at least 7 hours a day.  4. Vitamin D deficiency - He is status post therapy with vitamin D 50,000 units weekly for 12 weeks.  I advised patient to maintain close follow up with their PCP for primary care needs.  Patient is asked to bring meter and  blood glucose logs during his next visit.   Follow up plan: Return in about 3 months (around 10/01/2017) for meter, and logs.  Glade Lloyd, MD Phone: [phone]  Fax: [phone]   07/01/2017, 9:28 AM

## 2017-07-16 ENCOUNTER — Other Ambulatory Visit: Payer: Self-pay | Admitting: "Endocrinology

## 2017-07-28 DIAGNOSIS — E7800 Pure hypercholesterolemia, unspecified: Secondary | ICD-10-CM | POA: Diagnosis not present

## 2017-07-28 DIAGNOSIS — G894 Chronic pain syndrome: Secondary | ICD-10-CM | POA: Diagnosis not present

## 2017-07-28 DIAGNOSIS — Z1389 Encounter for screening for other disorder: Secondary | ICD-10-CM | POA: Diagnosis not present

## 2017-07-28 DIAGNOSIS — E1165 Type 2 diabetes mellitus with hyperglycemia: Secondary | ICD-10-CM | POA: Diagnosis not present

## 2017-07-28 DIAGNOSIS — Z6838 Body mass index (BMI) 38.0-38.9, adult: Secondary | ICD-10-CM | POA: Diagnosis not present

## 2017-07-29 DIAGNOSIS — Z1389 Encounter for screening for other disorder: Secondary | ICD-10-CM | POA: Diagnosis not present

## 2017-07-29 DIAGNOSIS — Z6838 Body mass index (BMI) 38.0-38.9, adult: Secondary | ICD-10-CM | POA: Diagnosis not present

## 2017-07-29 DIAGNOSIS — E7800 Pure hypercholesterolemia, unspecified: Secondary | ICD-10-CM | POA: Diagnosis not present

## 2017-08-18 ENCOUNTER — Other Ambulatory Visit: Payer: Self-pay

## 2017-08-18 MED ORDER — GLUCOSE BLOOD VI STRP: ORAL_STRIP | 5 refills | Status: DC

## 2017-08-24 ENCOUNTER — Other Ambulatory Visit: Payer: Self-pay | Admitting: "Endocrinology

## 2017-09-16 ENCOUNTER — Other Ambulatory Visit: Payer: Self-pay | Admitting: "Endocrinology

## 2017-09-16 MED ORDER — INSULIN ASPART PROT & ASPART (70-30 MIX) 100 UNIT/ML PEN: PEN_INJECTOR | SUBCUTANEOUS | 2 refills | Status: DC

## 2017-09-22 ENCOUNTER — Other Ambulatory Visit: Payer: Self-pay | Admitting: "Endocrinology

## 2017-09-22 DIAGNOSIS — E1165 Type 2 diabetes mellitus with hyperglycemia: Secondary | ICD-10-CM | POA: Diagnosis not present

## 2017-09-22 DIAGNOSIS — Z794 Long term (current) use of insulin: Secondary | ICD-10-CM | POA: Diagnosis not present

## 2017-09-23 LAB — RENAL FUNCTION PANEL
Albumin: 4.5 g/dL (ref 3.6–4.8)
BUN/Creatinine Ratio: 17 (ref 10–24)
BUN: 19 mg/dL (ref 8–27)
CO2: 26 mmol/L (ref 20–29)
CREATININE: 1.11 mg/dL (ref 0.76–1.27)
Calcium: 9.7 mg/dL (ref 8.6–10.2)
Chloride: 101 mmol/L (ref 96–106)
GFR, EST AFRICAN AMERICAN: 80 mL/min/{1.73_m2} (ref >59)
GFR, EST NON AFRICAN AMERICAN: 69 mL/min/{1.73_m2} (ref >59)
Glucose: 95 mg/dL (ref 65–99)
Phosphorus: 3.3 mg/dL (ref 2.5–4.5)
Potassium: 5 mmol/L (ref 3.5–5.2)
SODIUM: 141 mmol/L (ref 134–144)

## 2017-09-23 LAB — HGB A1C W/O EAG: Hgb A1c MFr Bld: 8.3 % — ABNORMAL HIGH (ref 4.8–5.6)

## 2017-09-27 ENCOUNTER — Other Ambulatory Visit: Payer: Self-pay | Admitting: "Endocrinology

## 2017-09-30 ENCOUNTER — Ambulatory Visit (INDEPENDENT_AMBULATORY_CARE_PROVIDER_SITE_OTHER): Payer: Medicare HMO | Admitting: "Endocrinology

## 2017-09-30 ENCOUNTER — Encounter: Payer: Self-pay | Admitting: "Endocrinology

## 2017-09-30 VITALS — BP 137/78 | HR 72 | Ht 64.0 in | Wt 224.0 lb

## 2017-09-30 DIAGNOSIS — E1165 Type 2 diabetes mellitus with hyperglycemia: Secondary | ICD-10-CM

## 2017-09-30 DIAGNOSIS — I10 Essential (primary) hypertension: Secondary | ICD-10-CM

## 2017-09-30 DIAGNOSIS — Z794 Long term (current) use of insulin: Secondary | ICD-10-CM | POA: Diagnosis not present

## 2017-09-30 DIAGNOSIS — IMO0001 Reserved for inherently not codable concepts without codable children: Secondary | ICD-10-CM

## 2017-09-30 DIAGNOSIS — E782 Mixed hyperlipidemia: Secondary | ICD-10-CM

## 2017-09-30 MED ORDER — INSULIN ASPART PROT & ASPART (70-30 MIX) 100 UNIT/ML PEN: PEN_INJECTOR | SUBCUTANEOUS | 2 refills | Status: DC

## 2017-09-30 MED ORDER — LIRAGLUTIDE 18 MG/3ML ~~LOC~~ SOPN: 1.8000 mg | PEN_INJECTOR | Freq: Every day | SUBCUTANEOUS | 0 refills | Status: DC

## 2017-09-30 NOTE — Progress Notes (Signed)
Subjective:    Patient ID: Patrick Taylor, male    DOB: [birth year],    Past Medical History:  Diagnosis Date  . Anemia   . Asthma   . Carpal tunnel syndrome   . Chronic back pain   . Colon polyps   . DM (diabetes mellitus) ([place])   . HTN (hypertension)   . Hyperlipidemia    Past Surgical History:  Procedure Laterality Date  . ABDOMINAL HERNIA REPAIR    . COLONOSCOPY  01/24/09   rectal polyp/adenomatous poylps/tubulovillous  . COLONOSCOPY  03/02/2012   Procedure: COLONOSCOPY;  Surgeon: Daneil Dolin, MD;  Location: [facility];  Service: Endoscopy;  Laterality: N/A;  11:00   Social History   Social History  . Marital status: Married    Spouse name: N/A  . Number of children: N/A  . Years of education: N/A   Occupational History  . works at [employer]  . Smoking status: Former Smoker    Quit date: 02/18/1999  . Smokeless tobacco: Never Used  . Alcohol use No     Comment: no heavy use in past  . Drug use: No  . Sexual activity: Not Asked   Other Topics Concern  . None   Social History Narrative  . None   Outpatient Encounter Prescriptions as of 09/30/2017  Medication Sig  . aspirin 81 MG tablet Take 81 mg by mouth daily.  . Blood Glucose Monitoring Suppl (ACCU-CHEK AVIVA PLUS) w/Device KIT Use as directed bid E11.65  . budesonide-formoterol (SYMBICORT) 80-4.5 MCG/ACT inhaler Inhale 2 puffs into the lungs 2 (two) times daily as needed (shortness of breath).   . cyclobenzaprine (FLEXERIL) 10 MG tablet Take 10 mg by mouth 3 (three) times daily as needed for muscle spasms.   . enalapril (VASOTEC) 20 MG tablet Take 1 tablet (20 mg total) by mouth daily.  . furosemide (LASIX) 20 MG tablet Take 20 mg by mouth daily.   [name] Kitchen gabapentin (NEURONTIN) 300 MG capsule Take 300 mg by mouth 3 (three) times daily.   [name] Kitchen glucose blood (ACCU-CHEK AVIVA) test strip Use as instructed tid. E11.65  . HYDROcodone-acetaminophen (NORCO) 7.5-325 MG tablet  Take 1 tablet by mouth 2 (two) times daily as needed for moderate pain.  [name] Kitchen insulin aspart protamine - aspart (NOVOLOG MIX 70/30 FLEXPEN) (70-30) 100 UNIT/ML FlexPen INJECT 30 UNITS INTO THE SKIN 2 TIMES DAILY.  [name] Kitchen Insulin Pen Needle (PEN NEEDLES) 31G X 5 MM MISC 1 each by Does not apply route 3 (three) times daily.  [name] Kitchen liraglutide (VICTOZA) 18 MG/3ML SOPN Inject 0.3 mLs (1.8 mg total) into the skin daily before supper.  . metFORMIN (GLUCOPHAGE) 1000 MG tablet Take 1,000 mg by mouth 2 (two) times daily with a meal.  . pravastatin (PRAVACHOL) 20 MG tablet Take 20 mg by mouth daily.  . Vitamin D, Ergocalciferol, (DRISDOL) 50000 units CAPS capsule TAKE ONE CAPSULE BY MOUTH ONCE EVERY WEEK  . [DISCONTINUED] insulin aspart protamine - aspart (NOVOLOG MIX 70/30 FLEXPEN) (70-30) 100 UNIT/ML FlexPen INJECT 35 UNITS INTO THE SKIN 2 TIMES DAILY.  . [DISCONTINUED] NOVOLOG MIX 70/30 FLEXPEN (70-30) 100 UNIT/ML FlexPen INJECT 40 UNITS INTO THE SKIN 2 TIMES DAILY.  . [DISCONTINUED] VICTOZA 18 MG/3ML SOPN INJECT 0.3 MLS (1.8 MG TOTAL) INTO THE SKIN DAILY BEFORE SUPPER.  . [DISCONTINUED] Vitamin D, Ergocalciferol, (DRISDOL) 50000 units CAPS capsule TAKE ONE CAPSULE BY MOUTH ONCE EVERY WEEK   No facility-administered encounter medications on  file as of 09/30/2017.    ALLERGIES: No Known Allergies VACCINATION STATUS:  There is no immunization history on file for this patient.  Diabetes  He presents for his follow-up diabetic visit. He has type 2 diabetes mellitus. Onset time: He was diagnosed at approximate age of 69 years. His disease course has been stable. There are no hypoglycemic associated symptoms. Pertinent negatives for hypoglycemia include no confusion, headaches, pallor or seizures. Pertinent negatives for diabetes include no chest pain, no fatigue, no polydipsia, no polyphagia, no polyuria and no weakness. There are no hypoglycemic complications. Symptoms are stable. Risk factors for coronary artery  disease include dyslipidemia, diabetes mellitus, hypertension and sedentary lifestyle. Current diabetic treatment includes oral agent (monotherapy). He is compliant with treatment most of the time. His weight is stable. He is following a generally unhealthy diet. He never participates in exercise. His breakfast blood glucose range is generally 130-140 mg/dl. His lunch blood glucose range is generally 130-140 mg/dl. His dinner blood glucose range is generally 140-180 mg/dl. His bedtime blood glucose range is generally 140-180 mg/dl. His overall blood glucose range is 140-180 mg/dl. An ACE inhibitor/angiotensin II receptor blocker is being taken. Eye exam is current.  Hyperlipidemia  This is a chronic problem. The current episode started more than 1 year ago. Pertinent negatives include no chest pain, myalgias or shortness of breath. Current antihyperlipidemic treatment includes statins.  Hypertension  This is a chronic problem. The current episode started more than 1 year ago. Pertinent negatives include no chest pain, headaches, neck pain, palpitations or shortness of breath. Past treatments include ACE inhibitors.    Review of Systems  Constitutional: Negative for fatigue and unexpected weight change.  HENT: Negative for dental problem, mouth sores and trouble swallowing.   Eyes: Negative for visual disturbance.  Respiratory: Negative for cough, choking, chest tightness, shortness of breath and wheezing.   Cardiovascular: Negative for chest pain, palpitations and leg swelling.  Gastrointestinal: Negative for abdominal distention, abdominal pain, constipation, diarrhea, nausea and vomiting.  Endocrine: Negative for polydipsia, polyphagia and polyuria.  Genitourinary: Negative for dysuria, flank pain, hematuria and urgency.  Musculoskeletal: Negative for back pain, gait problem, myalgias and neck pain.  Skin: Negative for pallor, rash and wound.  Neurological: Negative for seizures, syncope,  weakness, numbness and headaches.  Psychiatric/Behavioral: Negative.  Negative for confusion and dysphoric mood.    Objective:    BP 137/78   Pulse 72   Ht '5\' 4"'  (1.626 m)   Wt 224 lb (101.6 kg)   BMI 38.45 kg/m   Wt Readings from Last 3 Encounters:  09/30/17 224 lb (101.6 kg)  03/24/17 228 lb (103.4 kg)  12/21/16 226 lb (102.5 kg)    Physical Exam  Constitutional: He is oriented to person, place, and time. He appears well-developed and well-nourished. He is cooperative. No distress.  HENT:  Head: Normocephalic and atraumatic.  Eyes: EOM are normal.  Neck: Normal range of motion. Neck supple. No tracheal deviation present. No thyromegaly present.  Cardiovascular: Normal rate, S1 normal, S2 normal and normal heart sounds.  Exam reveals no gallop.   No murmur heard. Pulses:      Dorsalis pedis pulses are 1+ on the right side, and 1+ on the left side.       Posterior tibial pulses are 1+ on the right side, and 1+ on the left side.  Pulmonary/Chest: Breath sounds normal. No respiratory distress. He has no wheezes.  Abdominal: Soft. Bowel sounds are normal. He exhibits no distension.  There is no tenderness. There is no guarding and no CVA tenderness.  Musculoskeletal: He exhibits no edema.       Right shoulder: He exhibits no swelling and no deformity.  Neurological: He is alert and oriented to person, place, and time. He has normal strength and normal reflexes. No cranial nerve deficit or sensory deficit. Gait normal.  Skin: Skin is warm and dry. No rash noted. No cyanosis. Nails show no clubbing.  Psychiatric: He has a normal mood and affect. His speech is normal and behavior is normal. Judgment and thought content normal. Cognition and memory are normal.    Results for orders placed or performed in visit on 09/22/17  Renal function panel  Result Value Ref Range   Glucose 95 65 - 99 mg/dL   BUN 19 8 - 27 mg/dL   Creatinine, Ser 1.11 0.76 - 1.27 mg/dL   GFR calc non Af Amer 69  >59 mL/min/1.73   GFR calc Af Amer 80 >59 mL/min/1.73   BUN/Creatinine Ratio 17 10 - 24   Sodium 141 134 - 144 mmol/L   Potassium 5.0 3.5 - 5.2 mmol/L   Chloride 101 96 - 106 mmol/L   CO2 26 20 - 29 mmol/L   Calcium 9.7 8.6 - 10.2 mg/dL   Phosphorus 3.3 2.5 - 4.5 mg/dL   Albumin 4.5 3.6 - 4.8 g/dL  Hgb A1c w/o eAG  Result Value Ref Range   Hgb A1c MFr Bld 8.3 (H) 4.8 - 5.6 %  Diabetic Labs (most recent): Lab Results  Component Value Date   HGBA1C 8.3 (H) 09/22/2017   HGBA1C 8.0 (H) 06/27/2017   HGBA1C 8.7 (H) 03/16/2017     Assessment & Plan:   1. Uncontrolled type 2 diabetes mellitus without complication, with long-term current use of insulin ([place])  Patient came with Improving , near target glucose profile, and  recent A1c is stable at 8.3%, progressively improving from  10.1%.   Glucose logs and insulin administration records pertaining to this visit,  to be scanned into patient's records.  Recent labs reviewed. - Patient remains at a high risk for more acute and chronic complications of diabetes which include CAD, CVA, CKD, retinopathy, and neuropathy. These are all discussed in detail with the patient.  - I have re-counseled the patient on diet management and weight loss  by adopting a carbohydrate restricted / protein rich  Diet. - Patient is advised to stick to a routine mealtimes to eat 3 meals  a day and avoid unnecessary snacks ( to snack only to correct hypoglycemia).   -  Suggestion is made for him to avoid simple carbohydrates  from his diet including Cakes, Sweet Desserts / Pastries, Ice Cream, Soda (diet and regular), Sweet Tea, Candies, Chips, Cookies, Store Bought Juices, Alcohol in Excess of  1-2 drinks a day, Artificial Sweeteners, and "Sugar-free" Products. This will help patient to have stable blood glucose profile and potentially avoid unintended weight gain.   - I have approached patient with the following individualized plan to manage diabetes and patient  agrees.  - I will lower NovoLog 70/30- 30 units twice a day  for premeal glucose of 90 or above. -He will continue  strict monitoring of glucose 2 times a day-before breakfast and at bedtime.   -Patient is warned not to take insulin without proper monitoring per orders.  -Patient is encouraged to call clinic for blood glucose levels less than 70 or above 300 mg /dl.  -For better insulin sensitivity  I will continue  metformin at 1010m po twice a day with meals. - Januvia  was discontinued due to cost. - I will continue his Victoza to 1.8 mg daily, side effects  and precaution discussed with him.  - I asked him to balance his diet with more protein and less carbs if this is not contracting his belief.  - Patient specific target  for A1c; LDL, HDL, Triglycerides, and  Waist Circumference were discussed in detail.  2) BP/HTN: controlled.I advised him to continue current medications  including ACEI. I advised him to be consistent in taking his blood pressure medications in the morning. 3) Lipids/HPL: continue statins. 4)  Weight/Diet: CDE consult in progress, exercise, and carbohydrates information provided.  5) Chronic Care/Health Maintenance:  -Patient is on ACEI and Statin medications and encouraged to continue to follow up with Ophthalmology, Podiatrist at least yearly or according to recommendations, and advised to stay away from smoking. I have recommended yearly flu vaccine and pneumonia vaccination at least every 5 years; moderate intensity exercise for up to 150 minutes weekly; and  sleep for at least 7 hours a day.  4. Vitamin D deficiency - He is status post therapy with vitamin D 50,000 units weekly for 12 weeks.  I advised patient to maintain close follow up with his PCP for primary care needs.  - Time spent with the patient: 25 min, of which >50% was spent in reviewing his sugar logs , discussing his hypo- and hyper-glycemic episodes, reviewing his current and  previous labs  and insulin doses and developing a plan to avoid hypo- and hyper-glycemia.    Follow up plan: Return in about 3 months (around 12/31/2017) for meter, and logs.  GGlade Lloyd MD Phone: [phone] Fax: [phone] -  This note was partially dictated with voice recognition software. Similar sounding words can be transcribed inadequately or may not  be corrected upon review.  09/30/2017, 11:14 AM

## 2017-09-30 NOTE — Patient Instructions (Signed)

## 2017-10-31 DIAGNOSIS — Z7689 Persons encountering health services in other specified circumstances: Secondary | ICD-10-CM | POA: Diagnosis not present

## 2017-10-31 DIAGNOSIS — Z1389 Encounter for screening for other disorder: Secondary | ICD-10-CM | POA: Diagnosis not present

## 2017-10-31 DIAGNOSIS — M792 Neuralgia and neuritis, unspecified: Secondary | ICD-10-CM | POA: Diagnosis not present

## 2017-10-31 DIAGNOSIS — Z6838 Body mass index (BMI) 38.0-38.9, adult: Secondary | ICD-10-CM | POA: Diagnosis not present

## 2017-10-31 DIAGNOSIS — G8929 Other chronic pain: Secondary | ICD-10-CM | POA: Diagnosis not present

## 2017-11-01 ENCOUNTER — Other Ambulatory Visit: Payer: Self-pay | Admitting: "Endocrinology

## 2017-12-21 ENCOUNTER — Other Ambulatory Visit: Payer: Self-pay

## 2017-12-28 ENCOUNTER — Other Ambulatory Visit: Payer: Self-pay | Admitting: "Endocrinology

## 2017-12-28 DIAGNOSIS — E559 Vitamin D deficiency, unspecified: Secondary | ICD-10-CM | POA: Diagnosis not present

## 2017-12-28 DIAGNOSIS — E782 Mixed hyperlipidemia: Secondary | ICD-10-CM | POA: Diagnosis not present

## 2017-12-28 DIAGNOSIS — E1165 Type 2 diabetes mellitus with hyperglycemia: Secondary | ICD-10-CM | POA: Diagnosis not present

## 2017-12-28 DIAGNOSIS — Z794 Long term (current) use of insulin: Secondary | ICD-10-CM | POA: Diagnosis not present

## 2017-12-29 LAB — LIPID PANEL W/O CHOL/HDL RATIO
Cholesterol, Total: 122 mg/dL (ref 100–199)
HDL: 40 mg/dL (ref >39)
LDL CALC: 59 mg/dL (ref 0–99)
Triglycerides: 115 mg/dL (ref 0–149)
VLDL CHOLESTEROL CAL: 23 mg/dL (ref 5–40)

## 2017-12-29 LAB — COMPREHENSIVE METABOLIC PANEL
A/G RATIO: 1.8 (ref 1.2–2.2)
ALT: 9 IU/L (ref 0–44)
AST: 20 IU/L (ref 0–40)
Albumin: 4.5 g/dL (ref 3.6–4.8)
Alkaline Phosphatase: 56 IU/L (ref 39–117)
BUN/Creatinine Ratio: 12 (ref 10–24)
BUN: 12 mg/dL (ref 8–27)
Bilirubin Total: 0.4 mg/dL (ref 0.0–1.2)
CALCIUM: 9.3 mg/dL (ref 8.6–10.2)
CO2: 26 mmol/L (ref 20–29)
CREATININE: 1.01 mg/dL (ref 0.76–1.27)
Chloride: 103 mmol/L (ref 96–106)
GFR, EST AFRICAN AMERICAN: 89 mL/min/{1.73_m2} (ref >59)
GFR, EST NON AFRICAN AMERICAN: 77 mL/min/{1.73_m2} (ref >59)
Globulin, Total: 2.5 g/dL (ref 1.5–4.5)
Glucose: 91 mg/dL (ref 65–99)
Potassium: 4.8 mmol/L (ref 3.5–5.2)
Sodium: 144 mmol/L (ref 134–144)
TOTAL PROTEIN: 7 g/dL (ref 6.0–8.5)

## 2017-12-29 LAB — MICROALBUMIN / CREATININE URINE RATIO
CREATININE, UR: 29.5 mg/dL
Microalbumin, Urine: <3 ug/mL

## 2017-12-29 LAB — T4, FREE: Free T4: 0.86 ng/dL (ref 0.82–1.77)

## 2017-12-29 LAB — VITAMIN D 25 HYDROXY (VIT D DEFICIENCY, FRACTURES): VIT D 25 HYDROXY: 22.5 ng/mL — AB (ref 30.0–100.0)

## 2017-12-29 LAB — HGB A1C W/O EAG: Hgb A1c MFr Bld: 7.1 % — ABNORMAL HIGH (ref 4.8–5.6)

## 2017-12-29 LAB — TSH: TSH: 2.87 u[IU]/mL (ref 0.450–4.500)

## 2018-01-03 ENCOUNTER — Encounter: Payer: Self-pay | Admitting: "Endocrinology

## 2018-01-03 ENCOUNTER — Ambulatory Visit (INDEPENDENT_AMBULATORY_CARE_PROVIDER_SITE_OTHER): Payer: Medicare HMO | Admitting: "Endocrinology

## 2018-01-03 VITALS — BP 151/65 | HR 65 | Ht 64.0 in | Wt 224.0 lb

## 2018-01-03 DIAGNOSIS — Z794 Long term (current) use of insulin: Secondary | ICD-10-CM | POA: Diagnosis not present

## 2018-01-03 DIAGNOSIS — R809 Proteinuria, unspecified: Secondary | ICD-10-CM | POA: Diagnosis not present

## 2018-01-03 DIAGNOSIS — E559 Vitamin D deficiency, unspecified: Secondary | ICD-10-CM

## 2018-01-03 DIAGNOSIS — I10 Essential (primary) hypertension: Secondary | ICD-10-CM | POA: Diagnosis not present

## 2018-01-03 DIAGNOSIS — IMO0001 Reserved for inherently not codable concepts without codable children: Secondary | ICD-10-CM

## 2018-01-03 DIAGNOSIS — E1165 Type 2 diabetes mellitus with hyperglycemia: Secondary | ICD-10-CM | POA: Diagnosis not present

## 2018-01-03 DIAGNOSIS — E782 Mixed hyperlipidemia: Secondary | ICD-10-CM | POA: Diagnosis not present

## 2018-01-03 MED ORDER — INSULIN ASPART PROT & ASPART (70-30 MIX) 100 UNIT/ML PEN: PEN_INJECTOR | SUBCUTANEOUS | 2 refills | Status: DC

## 2018-01-03 MED ORDER — VITAMIN D3 125 MCG (5000 UT) PO CAPS: 5000.0000 [IU] | ORAL_CAPSULE | Freq: Every day | ORAL | 0 refills | Status: DC

## 2018-01-03 NOTE — Patient Instructions (Signed)

## 2018-01-03 NOTE — Progress Notes (Signed)
Subjective:    Patient ID: Patrick Taylor, male    DOB: 1951/08/03,    Past Medical History:  Diagnosis Date  . Anemia   . Asthma   . Carpal tunnel syndrome   . Chronic back pain   . Colon polyps   . DM (diabetes mellitus) ([place])   . HTN (hypertension)   . Hyperlipidemia    Past Surgical History:  Procedure Laterality Date  . ABDOMINAL HERNIA REPAIR    . COLONOSCOPY  01/24/09   rectal polyp/adenomatous poylps/tubulovillous  . COLONOSCOPY  03/02/2012   Procedure: COLONOSCOPY;  Surgeon: Daneil Dolin, MD;  Location: [facility];  Service: Endoscopy;  Laterality: N/A;  11:00   Social History   Socioeconomic History  . Marital status: Married    Spouse name: None  . Number of children: None  . Years of education: None  . Highest education level: None  Social Needs  . Financial resource strain: None  . Food insecurity - worry: None  . Food insecurity - inability: None  . Transportation needs - medical: None  . Transportation needs - non-medical: None  Occupational History  . Occupation: works at [employer]  . Smoking status: Former Smoker    Last attempt to quit: 02/18/1999    Years since quitting: 18.8  . Smokeless tobacco: Never Used  Substance and Sexual Activity  . Alcohol use: No    Comment: no heavy use in past  . Drug use: No  . Sexual activity: None  Other Topics Concern  . None  Social History Narrative  . None   Outpatient Encounter Medications as of 01/03/2018  Medication Sig  . aspirin 81 MG tablet Take 81 mg by mouth daily.  . B-D UF III MINI PEN NEEDLES 31G X 5 MM MISC USE FOR INSULIN INJECTIONS 3 TIMES A DAY  . Blood Glucose Monitoring Suppl (ACCU-CHEK AVIVA PLUS) w/Device KIT Use as directed bid E11.65  . budesonide-formoterol (SYMBICORT) 80-4.5 MCG/ACT inhaler Inhale 2 puffs into the lungs 2 (two) times daily as needed (shortness of breath).   . cyclobenzaprine (FLEXERIL) 10 MG tablet Take 10 mg by mouth 3 (three) times daily  as needed for muscle spasms.   . enalapril (VASOTEC) 20 MG tablet Take 1 tablet (20 mg total) by mouth daily.  . furosemide (LASIX) 20 MG tablet Take 20 mg by mouth daily.   [name] Kitchen gabapentin (NEURONTIN) 300 MG capsule Take 300 mg by mouth 3 (three) times daily.   [name] Kitchen glucose blood (ACCU-CHEK AVIVA) test strip Use as instructed tid. E11.65  . HYDROcodone-acetaminophen (NORCO) 7.5-325 MG tablet Take 1 tablet by mouth 2 (two) times daily as needed for moderate pain.  [name] Kitchen insulin aspart protamine - aspart (NOVOLOG MIX 70/30 FLEXPEN) (70-30) 100 UNIT/ML FlexPen INJECT 20 UNITS INTO THE SKIN 2 TIMES DAILY.  [name] Kitchen liraglutide (VICTOZA) 18 MG/3ML SOPN Inject 0.3 mLs (1.8 mg total) into the skin daily before supper.  . metFORMIN (GLUCOPHAGE) 1000 MG tablet Take 1,000 mg by mouth 2 (two) times daily with a meal.  . pravastatin (PRAVACHOL) 20 MG tablet Take 20 mg by mouth daily.  . Vitamin D, Ergocalciferol, (DRISDOL) 50000 units CAPS capsule TAKE ONE CAPSULE BY MOUTH ONCE EVERY WEEK  . [DISCONTINUED] insulin aspart protamine - aspart (NOVOLOG MIX 70/30 FLEXPEN) (70-30) 100 UNIT/ML FlexPen INJECT 30 UNITS INTO THE SKIN 2 TIMES DAILY.   No facility-administered encounter medications on file as of 01/03/2018.    ALLERGIES: No Known Allergies  VACCINATION STATUS:  There is no immunization history on file for this patient.  Diabetes  He presents for his follow-up diabetic visit. He has type 2 diabetes mellitus. Onset time: He was diagnosed at approximate age of 15 years. His disease course has been improving. There are no hypoglycemic associated symptoms. Pertinent negatives for hypoglycemia include no confusion, headaches, pallor or seizures. Pertinent negatives for diabetes include no chest pain, no fatigue, no polydipsia, no polyphagia, no polyuria and no weakness. There are no hypoglycemic complications. Symptoms are improving. Risk factors for coronary artery disease include dyslipidemia, diabetes mellitus,  hypertension and sedentary lifestyle. Current diabetic treatment includes oral agent (monotherapy). He is compliant with treatment most of the time. His weight is stable. He is following a generally unhealthy diet. He never participates in exercise. His breakfast blood glucose range is generally 130-140 mg/dl. His lunch blood glucose range is generally 130-140 mg/dl. His dinner blood glucose range is generally 130-140 mg/dl. His bedtime blood glucose range is generally 140-180 mg/dl. His overall blood glucose range is 130-140 mg/dl. An ACE inhibitor/angiotensin II receptor blocker is being taken. Eye exam is current.  Hyperlipidemia  This is a chronic problem. The current episode started more than 1 year ago. Pertinent negatives include no chest pain, myalgias or shortness of breath. Current antihyperlipidemic treatment includes statins.  Hypertension  This is a chronic problem. The current episode started more than 1 year ago. Pertinent negatives include no chest pain, headaches, neck pain, palpitations or shortness of breath. Past treatments include ACE inhibitors.    Review of Systems  Constitutional: Negative for fatigue and unexpected weight change.  HENT: Negative for dental problem, mouth sores and trouble swallowing.   Eyes: Negative for visual disturbance.  Respiratory: Negative for cough, choking, chest tightness, shortness of breath and wheezing.   Cardiovascular: Negative for chest pain, palpitations and leg swelling.  Gastrointestinal: Negative for abdominal distention, abdominal pain, constipation, diarrhea, nausea and vomiting.  Endocrine: Negative for polydipsia, polyphagia and polyuria.  Genitourinary: Negative for dysuria, flank pain, hematuria and urgency.  Musculoskeletal: Negative for back pain, gait problem, myalgias and neck pain.  Skin: Negative for pallor, rash and wound.  Neurological: Negative for seizures, syncope, weakness, numbness and headaches.   Psychiatric/Behavioral: Negative.  Negative for confusion and dysphoric mood.    Objective:    BP (!) 151/65   Pulse 65   Ht _0  (1.626 m)   Wt 224 lb (101.6 kg)   BMI 38.45 kg/m   Wt Readings from Last 3 Encounters:  01/03/18 224 lb (101.6 kg)  09/30/17 224 lb (101.6 kg)  03/24/17 228 lb (103.4 kg)    Physical Exam  Constitutional: He is oriented to person, place, and time. He appears well-developed and well-nourished. He is cooperative. No distress.  HENT:  Head: Normocephalic and atraumatic.  Eyes: EOM are normal.  Neck: Normal range of motion. Neck supple. No tracheal deviation present. No thyromegaly present.  Cardiovascular: Normal rate, S1 normal, S2 normal and normal heart sounds. Exam reveals no gallop.  No murmur heard. Pulses:      Dorsalis pedis pulses are 1+ on the right side, and 1+ on the left side.       Posterior tibial pulses are 1+ on the right side, and 1+ on the left side.  Pulmonary/Chest: Breath sounds normal. No respiratory distress. He has no wheezes.  Abdominal: Soft. Bowel sounds are normal. He exhibits no distension. There is no tenderness. There is no guarding and no CVA tenderness.  Musculoskeletal: He exhibits no edema.       Right shoulder: He exhibits no swelling and no deformity.  Neurological: He is alert and oriented to person, place, and time. He has normal strength and normal reflexes. No cranial nerve deficit or sensory deficit. Gait normal.  Skin: Skin is warm and dry. No rash noted. No cyanosis. Nails show no clubbing.  Psychiatric: He has a normal mood and affect. His speech is normal and behavior is normal. Judgment and thought content normal. Cognition and memory are normal.    Results for orders placed or performed in visit on 12/28/17  Comprehensive metabolic panel  Result Value Ref Range   Glucose 91 65 - 99 mg/dL   BUN 12 8 - 27 mg/dL   Creatinine, Ser 1.01 0.76 - 1.27 mg/dL   GFR calc non Af Amer 77 >59 mL/min/1.73   GFR  calc Af Amer 89 >59 mL/min/1.73   BUN/Creatinine Ratio 12 10 - 24   Sodium 144 134 - 144 mmol/L   Potassium 4.8 3.5 - 5.2 mmol/L   Chloride 103 96 - 106 mmol/L   CO2 26 20 - 29 mmol/L   Calcium 9.3 8.6 - 10.2 mg/dL   Total Protein 7.0 6.0 - 8.5 g/dL   Albumin 4.5 3.6 - 4.8 g/dL   Globulin, Total 2.5 1.5 - 4.5 g/dL   Albumin/Globulin Ratio 1.8 1.2 - 2.2   Bilirubin Total 0.4 0.0 - 1.2 mg/dL   Alkaline Phosphatase 56 39 - 117 IU/L   AST 20 0 - 40 IU/L   ALT 9 0 - 44 IU/L  Lipid Panel w/o Chol/HDL Ratio  Result Value Ref Range   Cholesterol, Total 122 100 - 199 mg/dL   Triglycerides 115 0 - 149 mg/dL   HDL 40 >39 mg/dL   VLDL Cholesterol Cal 23 5 - 40 mg/dL   LDL Calculated 59 0 - 99 mg/dL  Microalbumin / creatinine urine ratio  Result Value Ref Range   Creatinine, Urine 29.5 Not Estab. mg/dL   Albumin, Urine <3.0 Not Estab. ug/mL   Microalb/Creat Ratio <10.2 0.0 - 30.0 mg/g creat  Hgb A1c w/o eAG  Result Value Ref Range   Hgb A1c MFr Bld 7.1 (H) 4.8 - 5.6 %  T4, free  Result Value Ref Range   Free T4 0.86 0.82 - 1.77 ng/dL  TSH  Result Value Ref Range   TSH 2.870 0.450 - 4.500 uIU/mL  VITAMIN D 25 Hydroxy (Vit-D Deficiency, Fractures)  Result Value Ref Range   Vit D, 25-Hydroxy 22.5 (L) 30.0 - 100.0 ng/mL  Diabetic Labs (most recent): Lab Results  Component Value Date   HGBA1C 7.1 (H) 12/28/2017   HGBA1C 8.3 (H) 09/22/2017   HGBA1C 8.0 (H) 06/27/2017     Assessment & Plan:   1. Uncontrolled type 2 diabetes mellitus without complication, with long-term current use of insulin ([place])  Patient came with Improving , near target glucose profile, and  recent A1c is stable at 8.3%, progressively improving from  10.1%.   Glucose logs and insulin administration records pertaining to this visit,  to be scanned into patient's records.  Recent labs reviewed. - Patient remains at a high risk for more acute and chronic complications of diabetes which include CAD, CVA, CKD,  retinopathy, and neuropathy. These are all discussed in detail with the patient.  - I have re-counseled the patient on diet management and weight loss  by adopting a carbohydrate restricted / protein rich  Diet. - Patient  is advised to stick to a routine mealtimes to eat 3 meals  a day and avoid unnecessary snacks ( to snack only to correct hypoglycemia).   -  Suggestion is made for him to avoid simple carbohydrates  from his diet including Cakes, Sweet Desserts / Pastries, Ice Cream, Soda (diet and regular), Sweet Tea, Candies, Chips, Cookies, Store Bought Juices, Alcohol in Excess of  1-2 drinks a day, Artificial Sweeteners, and "Sugar-free" Products. This will help patient to have stable blood glucose profile and potentially avoid unintended weight gain.   - I have approached patient with the following individualized plan to manage diabetes and patient agrees.  - I will lower NovoLog 70/30- 20 units twice a day  for premeal glucose of 90 or above. -He will continue  strict monitoring of glucose 2 times a day-before breakfast and at bedtime.   -Patient is warned not to take insulin without proper monitoring per orders.  -Patient is encouraged to call clinic for blood glucose levels less than 70 or above 300 mg /dl.  -For better insulin sensitivity I will continue  metformin at 1031m po twice a day with meals. - Januvia  was discontinued due to cost. - I will continue his Victoza to 1.8 mg daily, side effects  and precaution discussed with him.  - I asked him to balance his diet with more protein and less carbs if this is not contracting his belief.  - Patient specific target  for A1c; LDL, HDL, Triglycerides, and  Waist Circumference were discussed in detail.  2) BP/HTN: His blood pressure is not controlled to target this morning.  He is advised to continue current medications  including ACEI. I advised him to be consistent in taking his blood pressure medications in the morning. 3)  Lipids/HPL: Controlled, LDL at 59 continue statins. 4)  Weight/Diet: CDE consult in progress, exercise, and carbohydrates information provided.  5) Chronic Care/Health Maintenance:  -Patient is on ACEI and Statin medications and encouraged to continue to follow up with Ophthalmology, Podiatrist at least yearly or according to recommendations, and advised to stay away from smoking. I have recommended yearly flu vaccine and pneumonia vaccination at least every 5 years; moderate intensity exercise for up to 150 minutes weekly; and  sleep for at least 7 hours a day.  4. Vitamin D deficiency - He still has vitamin D deficiency. I will initiate vitamin D3 5000 units daily for the next 90 days.  I advised patient to maintain close follow up with his PCP for primary care needs.  - Time spent with the patient: 25 min, of which >50% was spent in reviewing his blood glucose logs , discussing his hypo- and hyper-glycemic episodes, reviewing his current and  previous labs and insulin doses and developing a plan to avoid hypo- and hyper-glycemia. Please refer to Patient Instructions for Blood Glucose Monitoring and Insulin/Medications Dosing Guide"  in media tab for additional information.  Follow up plan: Return in about 4 months (around 05/03/2018) for follow up with pre-visit labs, meter, and logs.  GGlade Lloyd MD Phone: [phone] Fax: [phone] -  This note was partially dictated with voice recognition software. Similar sounding words can be transcribed inadequately or may not  be corrected upon review.  01/03/2018, 10:57 AM

## 2018-01-13 ENCOUNTER — Other Ambulatory Visit: Payer: Self-pay

## 2018-01-13 ENCOUNTER — Encounter (HOSPITAL_COMMUNITY): Payer: Self-pay | Admitting: *Deleted

## 2018-01-13 ENCOUNTER — Emergency Department (HOSPITAL_COMMUNITY)
Admission: EM | Admit: 2018-01-13 | Discharge: 2018-01-13 | Disposition: A | Discharge status: Home / Self Care | Payer: Medicare HMO | Attending: Emergency Medicine | Admitting: Emergency Medicine

## 2018-01-13 ENCOUNTER — Emergency Department (HOSPITAL_COMMUNITY): Payer: Medicare HMO

## 2018-01-13 DIAGNOSIS — E119 Type 2 diabetes mellitus without complications: Secondary | ICD-10-CM | POA: Insufficient documentation

## 2018-01-13 DIAGNOSIS — I10 Essential (primary) hypertension: Secondary | ICD-10-CM | POA: Diagnosis not present

## 2018-01-13 DIAGNOSIS — Z794 Long term (current) use of insulin: Secondary | ICD-10-CM | POA: Insufficient documentation

## 2018-01-13 DIAGNOSIS — J45909 Unspecified asthma, uncomplicated: Secondary | ICD-10-CM | POA: Diagnosis not present

## 2018-01-13 DIAGNOSIS — Z87891 Personal history of nicotine dependence: Secondary | ICD-10-CM | POA: Insufficient documentation

## 2018-01-13 DIAGNOSIS — R0789 Other chest pain: Secondary | ICD-10-CM | POA: Diagnosis not present

## 2018-01-13 DIAGNOSIS — Z7982 Long term (current) use of aspirin: Secondary | ICD-10-CM | POA: Diagnosis not present

## 2018-01-13 DIAGNOSIS — Z79899 Other long term (current) drug therapy: Secondary | ICD-10-CM | POA: Insufficient documentation

## 2018-01-13 DIAGNOSIS — R079 Chest pain, unspecified: Secondary | ICD-10-CM | POA: Diagnosis not present

## 2018-01-13 LAB — BASIC METABOLIC PANEL
ANION GAP: 10 (ref 5–15)
BUN: 20 mg/dL (ref 6–20)
CO2: 27 mmol/L (ref 22–32)
Calcium: 9.4 mg/dL (ref 8.9–10.3)
Chloride: 102 mmol/L (ref 101–111)
Creatinine, Ser: 1.18 mg/dL (ref 0.61–1.24)
Glucose, Bld: 122 mg/dL — ABNORMAL HIGH (ref 65–99)
Potassium: 3.9 mmol/L (ref 3.5–5.1)
Sodium: 139 mmol/L (ref 135–145)

## 2018-01-13 LAB — CBC
HCT: 40.2 % (ref 39.0–52.0)
HEMOGLOBIN: 12.4 g/dL — AB (ref 13.0–17.0)
MCH: 26.1 pg (ref 26.0–34.0)
MCHC: 30.8 g/dL (ref 30.0–36.0)
MCV: 84.5 fL (ref 78.0–100.0)
Platelets: 339 10*3/uL (ref 150–400)
RBC: 4.76 MIL/uL (ref 4.22–5.81)
RDW: 13.5 % (ref 11.5–15.5)
WBC: 7.5 10*3/uL (ref 4.0–10.5)

## 2018-01-13 LAB — TROPONIN I

## 2018-01-13 NOTE — ED Triage Notes (Signed)
Pt reports chest pain since walking on a treadmill since yesterday.

## 2018-01-13 NOTE — Discharge Instructions (Signed)
Try using heat on the sore area 3 or 4 times a day.  For pain, use Tylenol, 650 mg, every 4 hours.  Return here, if needed, for problems.

## 2018-01-13 NOTE — ED Notes (Signed)
EKG given to Dr. Wentz 

## 2018-01-13 NOTE — ED Provider Notes (Signed)
[facility] EMERGENCY DEPARTMENT Provider Note   CSN: [id number] Arrival date & time: 01/13/18  1957     History   Chief Complaint Chief Complaint  Patient presents with  . Chest Pain    HPI Patrick Taylor is a 67 y.o. male.  He has had intermittent tight feeling in the left anterior chest since yesterday when he walked on the treadmill for 1 hour.  Discomfort started a couple of hours after he was on the treadmill.  The discomfort is present in his left chest and nose are very tender to touch.  He denies nausea, vomiting, diaphoresis, shortness of breath, weakness or dizziness.  Prior to yesterday he had not been on the treadmill for about 1 year.  He recalls holding on tight while he was walking on the treadmill yesterday.  He did not take anything today for the pain.  The pain returned today, while he was at home, not exercising so he decided to come here for evaluation.  There are no other known modifying factors.  HPI  Past Medical History:  Diagnosis Date  . Anemia   . Asthma   . Carpal tunnel syndrome   . Chronic back pain   . Colon polyps   . DM (diabetes mellitus) ([place])   . HTN (hypertension)   . Hyperlipidemia     Patient Active Problem List   Diagnosis Date Noted  . Morbid obesity due to excess calories ([place]) 07/14/2016  . Uncontrolled type 2 diabetes mellitus without complication, with long-term current use of insulin ([place]) 09/23/2015  . Benign hypertension 09/23/2015  . Hyperlipidemia 09/23/2015  . Vitamin D deficiency 09/23/2015  . Tubular adenoma of colon 02/18/2012  . Anemia 02/18/2012    Past Surgical History:  Procedure Laterality Date  . ABDOMINAL HERNIA REPAIR    . COLONOSCOPY  01/24/09   rectal polyp/adenomatous poylps/tubulovillous  . COLONOSCOPY  03/02/2012   Procedure: COLONOSCOPY;  Surgeon: Daneil Dolin, MD;  Location: [facility];  Service: Endoscopy;  Laterality: N/A;  11:00       Home Medications    Prior to Admission  medications   Medication Sig Start Date End Date Taking? Authorizing Provider  aspirin 81 MG tablet Take 81 mg by mouth daily.   Yes [provider]  B-D UF III MINI PEN NEEDLES 31G X 5 MM MISC USE FOR INSULIN INJECTIONS 3 TIMES A DAY 11/02/17  Yes Nida, Marella Chimes, MD  Blood Glucose Monitoring Suppl (ACCU-CHEK AVIVA PLUS) w/Device KIT Use as directed bid E11.65 06/29/17  Yes Nida, Marella Chimes, MD  budesonide-formoterol (SYMBICORT) 80-4.5 MCG/ACT inhaler Inhale 2 puffs into the lungs 2 (two) times daily as needed (shortness of breath).    Yes [provider]  enalapril (VASOTEC) 20 MG tablet Take 1 tablet (20 mg total) by mouth daily. 12/24/15  Yes Nida, Marella Chimes, MD  furosemide (LASIX) 20 MG tablet Take 20 mg by mouth daily.  01/22/12  Yes [provider]  gabapentin (NEURONTIN) 300 MG capsule Take 300 mg by mouth 3 (three) times daily.    Yes [provider]  glucose blood (ACCU-CHEK AVIVA) test strip Use as instructed tid. E11.65 08/18/17  Yes Nida, Marella Chimes, MD  HYDROcodone-acetaminophen (NORCO/VICODIN) 5-325 MG tablet Take 1 tablet by mouth 2 (two) times daily as needed for moderate pain.   Yes [provider]  insulin aspart protamine - aspart (NOVOLOG MIX 70/30 FLEXPEN) (70-30) 100 UNIT/ML FlexPen INJECT 20 UNITS INTO THE SKIN 2 TIMES  DAILY. 01/03/18  Yes Nida, Marella Chimes, MD  liraglutide (VICTOZA) 18 MG/3ML SOPN Inject 0.3 mLs (1.8 mg total) into the skin daily before supper. 09/30/17  Yes Nida, Marella Chimes, MD  metFORMIN (GLUCOPHAGE) 1000 MG tablet Take 1,000 mg by mouth 2 (two) times daily with a meal.   Yes [provider]  pravastatin (PRAVACHOL) 20 MG tablet Take 20 mg by mouth daily.   Yes [provider]  Vitamin D, Ergocalciferol, (DRISDOL) 50000 units CAPS capsule TAKE ONE CAPSULE BY MOUTH ONCE EVERY WEEK 07/18/17  Yes Nida, Marella Chimes, MD  Cholecalciferol (VITAMIN D3) 5000 units CAPS Take 1  capsule (5,000 Units total) by mouth daily. 01/03/18   Cassandria Anger, MD    Family History Family History  Problem Relation Age of Onset  . Colon cancer Unknown     Social History Social History   Tobacco Use  . Smoking status: Former Smoker    Last attempt to quit: 02/18/1999    Years since quitting: 18.9  . Smokeless tobacco: Never Used  Substance Use Topics  . Alcohol use: No    Comment: no heavy use in past  . Drug use: No     Allergies   Patient has no known allergies.   Review of Systems Review of Systems  All other systems reviewed and are negative.    Physical Exam Updated Vital Signs BP 130/64   Pulse 63   Temp 98.3 F (36.8 C) (Oral)   Resp 15   Ht 5' 4" (1.626 m)   Wt 101.6 kg (224 lb)   SpO2 98%   BMI 38.45 kg/m    Physical Exam  Constitutional: He is oriented to person, place, and time. He appears well-developed. He does not appear ill.  Overweight  HENT:  Head: Normocephalic and atraumatic.  Right Ear: External ear normal.  Left Ear: External ear normal.  Eyes: Conjunctivae and EOM are normal. Pupils are equal, round, and reactive to light.  Neck: Normal range of motion and phonation normal. Neck supple.  Cardiovascular: Normal rate, regular rhythm and normal heart sounds.  Tender left anterior chest wall.  Pulmonary/Chest: Effort normal and breath sounds normal. He exhibits no bony tenderness.  Abdominal: Soft. There is no tenderness.  Musculoskeletal: Normal range of motion.       Right lower leg: He exhibits no tenderness and no edema.       Left lower leg: He exhibits no tenderness and no edema.  Neurological: He is alert and oriented to person, place, and time. No cranial nerve deficit or sensory deficit. He exhibits normal muscle tone. Coordination normal.  Skin: Skin is warm, dry and intact.  Psychiatric: He has a normal mood and affect. His behavior is normal. Judgment and thought content normal.  Nursing note and vitals  reviewed.    ED Treatments / Results  Labs (all labs ordered are listed, but only abnormal results are displayed) Labs Reviewed  BASIC METABOLIC PANEL - Abnormal; Notable for the following components:      Result Value   Glucose, Bld 122 (*)    All other components within normal limits  CBC - Abnormal; Notable for the following components:   Hemoglobin 12.4 (*)    All other components within normal limits  TROPONIN I    EKG  EKG Interpretation None        Date: 01/13/2018  Rate: 80  Rhythm: normal sinus rhythm  QRS Axis: normal  PR and QT Intervals: normal  ST/T Wave abnormalities: normal  PR and QRS Conduction Disutrbances:Incomplete right bundle branch block  Narrative Interpretation:   Old EKG Reviewed: unchanged   Radiology Dg Chest 2 View  Result Date: 01/13/2018 CLINICAL DATA:  Left chest pain EXAM: CHEST  2 VIEW COMPARISON:  01/05/2006 FINDINGS: Heart and mediastinal contours are within normal limits. No focal opacities or effusions. No acute bony abnormality. Degenerative spurring throughout the thoracic spine. IMPRESSION: No active cardiopulmonary disease. Electronically Signed   By: Rolm Baptise M.D.   On: 01/13/2018 20:40    Procedures Procedures (including critical care time)  Medications Ordered in ED Medications - No data to display   Initial Impression / Assessment and Plan / ED Course  I have reviewed the triage vital signs and the nursing notes.  Pertinent labs & imaging results that were available during my care of the patient were reviewed by me and considered in my medical decision making (see chart for details).      Patient Vitals for the past 24 hrs:  BP Temp Temp src Pulse Resp SpO2 Height Weight  01/13/18 2100 130/64 - - 63 15 98 % - -  01/13/18 2010 - - - - - - 5' 4" (1.626 m) 101.6 kg (224 lb)  01/13/18 2009 (!) 141/114 98.3 F (36.8 C) Oral 76 15 96 % - -    10:18 PM Reevaluation with update and discussion. After initial  assessment and treatment, an updated evaluation reveals no change in clinical status.  Findings discussed with patient, and wife, all questions answered. Daleen Bo     Final Clinical Impressions(s) / ED Diagnoses   Final diagnoses:  Chest wall pain   Evaluation is consistent with chest wall pain.  ED evaluation is reassuring.  Doubt ACS, PE or pneumonia.  Screening blood work, troponin, metabolic panel, and complete blood count are normal.  Chest x-ray is normal.EKG does not show acute changes.  Nursing Notes Reviewed/ Care Coordinated Applicable Imaging Reviewed Interpretation of Laboratory Data incorporated into ED treatment  The patient appears reasonably screened and/or stabilized for discharge and I doubt any other medical condition or other [facility] requiring further screening, evaluation, or treatment in the ED at this time prior to discharge.  Plan: Home Medications-continue usual medications, Tylenol for pain.; Home Treatments-rest, heat to sore area, decrease activity on treadmill to 5-10 minutes maximum; return here if the recommended treatment, does not improve the symptoms; Recommended follow up-PCP, as needed   ED Discharge Orders    None      Daleen Bo, MD 01/13/18 2220

## 2018-01-31 DIAGNOSIS — Z6837 Body mass index (BMI) 37.0-37.9, adult: Secondary | ICD-10-CM | POA: Diagnosis not present

## 2018-01-31 DIAGNOSIS — J45909 Unspecified asthma, uncomplicated: Secondary | ICD-10-CM | POA: Diagnosis not present

## 2018-01-31 DIAGNOSIS — G894 Chronic pain syndrome: Secondary | ICD-10-CM | POA: Diagnosis not present

## 2018-01-31 DIAGNOSIS — H00022 Hordeolum internum right lower eyelid: Secondary | ICD-10-CM | POA: Diagnosis not present

## 2018-02-13 ENCOUNTER — Other Ambulatory Visit: Payer: Self-pay | Admitting: "Endocrinology

## 2018-02-13 ENCOUNTER — Telehealth: Payer: Self-pay | Admitting: "Endocrinology

## 2018-02-13 MED ORDER — INSULIN LISPRO PROT & LISPRO (75-25 MIX) 100 UNIT/ML KWIKPEN: 20.0000 [IU] | PEN_INJECTOR | Freq: Two times a day (BID) | SUBCUTANEOUS | 2 refills | Status: DC

## 2018-02-13 NOTE — Telephone Encounter (Signed)
Patrick Taylor is stating that his insurance has stopped paying all of his Victoza and Novolog and he is asking if there is  Something else to change him to that his insurance will cover, please advise?

## 2018-02-13 NOTE — Telephone Encounter (Signed)
I will switch him to Humalog 75/25 same dose twice a day.  He can stay off of Victoza until his next visit.

## 2018-02-14 NOTE — Telephone Encounter (Signed)
Doren is aware and understands the new recommendations

## 2018-02-16 ENCOUNTER — Other Ambulatory Visit: Payer: Self-pay

## 2018-02-16 MED ORDER — INSULIN LISPRO PROT & LISPRO (75-25 MIX) 100 UNIT/ML KWIKPEN: 20.0000 [IU] | PEN_INJECTOR | Freq: Two times a day (BID) | SUBCUTANEOUS | 2 refills | Status: DC

## 2018-02-17 ENCOUNTER — Other Ambulatory Visit: Payer: Self-pay | Admitting: "Endocrinology

## 2018-02-17 MED ORDER — INSULIN ASPART PROT & ASPART (70-30 MIX) 100 UNIT/ML PEN: 20.0000 [IU] | PEN_INJECTOR | Freq: Two times a day (BID) | SUBCUTANEOUS | 2 refills | Status: DC

## 2018-04-20 ENCOUNTER — Other Ambulatory Visit: Payer: Self-pay | Admitting: "Endocrinology

## 2018-04-26 ENCOUNTER — Other Ambulatory Visit: Payer: Self-pay | Admitting: "Endocrinology

## 2018-04-26 DIAGNOSIS — E1129 Type 2 diabetes mellitus with other diabetic kidney complication: Secondary | ICD-10-CM | POA: Diagnosis not present

## 2018-04-26 DIAGNOSIS — E1165 Type 2 diabetes mellitus with hyperglycemia: Secondary | ICD-10-CM | POA: Diagnosis not present

## 2018-04-26 DIAGNOSIS — Z794 Long term (current) use of insulin: Secondary | ICD-10-CM | POA: Diagnosis not present

## 2018-04-27 LAB — COMPREHENSIVE METABOLIC PANEL
A/G RATIO: 1.8 (ref 1.2–2.2)
ALBUMIN: 4.6 g/dL (ref 3.6–4.8)
ALK PHOS: 61 IU/L (ref 39–117)
ALT: 9 IU/L (ref 0–44)
AST: 23 IU/L (ref 0–40)
BILIRUBIN TOTAL: 0.3 mg/dL (ref 0.0–1.2)
BUN / CREAT RATIO: 15 (ref 10–24)
BUN: 16 mg/dL (ref 8–27)
CHLORIDE: 101 mmol/L (ref 96–106)
CO2: 26 mmol/L (ref 20–29)
Calcium: 10 mg/dL (ref 8.6–10.2)
Creatinine, Ser: 1.05 mg/dL (ref 0.76–1.27)
GFR calc non Af Amer: 74 mL/min/{1.73_m2} (ref >59)
GFR, EST AFRICAN AMERICAN: 85 mL/min/{1.73_m2} (ref >59)
GLOBULIN, TOTAL: 2.5 g/dL (ref 1.5–4.5)
GLUCOSE: 104 mg/dL — AB (ref 65–99)
Potassium: 5 mmol/L (ref 3.5–5.2)
SODIUM: 140 mmol/L (ref 134–144)
TOTAL PROTEIN: 7.1 g/dL (ref 6.0–8.5)

## 2018-04-27 LAB — HGB A1C W/O EAG: Hgb A1c MFr Bld: 7.3 % — ABNORMAL HIGH (ref 4.8–5.6)

## 2018-05-03 ENCOUNTER — Ambulatory Visit (INDEPENDENT_AMBULATORY_CARE_PROVIDER_SITE_OTHER): Payer: Medicare HMO | Admitting: "Endocrinology

## 2018-05-03 ENCOUNTER — Encounter: Payer: Self-pay | Admitting: "Endocrinology

## 2018-05-03 VITALS — BP 136/81 | HR 64 | Ht 64.0 in | Wt 213.0 lb

## 2018-05-03 DIAGNOSIS — IMO0001 Reserved for inherently not codable concepts without codable children: Secondary | ICD-10-CM

## 2018-05-03 DIAGNOSIS — E782 Mixed hyperlipidemia: Secondary | ICD-10-CM

## 2018-05-03 DIAGNOSIS — Z794 Long term (current) use of insulin: Secondary | ICD-10-CM

## 2018-05-03 DIAGNOSIS — I10 Essential (primary) hypertension: Secondary | ICD-10-CM | POA: Diagnosis not present

## 2018-05-03 DIAGNOSIS — E1165 Type 2 diabetes mellitus with hyperglycemia: Secondary | ICD-10-CM

## 2018-05-03 NOTE — Progress Notes (Signed)
Subjective:    Patient ID: Patrick Taylor, male    DOB: 1951-02-29,    Past Medical History:  Diagnosis Date  . Anemia   . Asthma   . Carpal tunnel syndrome   . Chronic back pain   . Colon polyps   . DM (diabetes mellitus) ([place])   . HTN (hypertension)   . Hyperlipidemia    Past Surgical History:  Procedure Laterality Date  . ABDOMINAL HERNIA REPAIR    . COLONOSCOPY  01/24/09   rectal polyp/adenomatous poylps/tubulovillous  . COLONOSCOPY  03/02/2012   Procedure: COLONOSCOPY;  Surgeon: Daneil Dolin, MD;  Location: [facility];  Service: Endoscopy;  Laterality: N/A;  11:00   Social History   Socioeconomic History  . Marital status: Married    Spouse name: Not on file  . Number of children: Not on file  . Years of education: Not on file  . Highest education level: Not on file  Occupational History  . Occupation: works at [employer]  . Financial resource strain: Not on file  . Food insecurity:    Worry: Not on file    Inability: Not on file  . Transportation needs:    Medical: Not on file    Non-medical: Not on file  Tobacco Use  . Smoking status: Former Smoker    Last attempt to quit: 02/18/1999    Years since quitting: 19.2  . Smokeless tobacco: Never Used  Substance and Sexual Activity  . Alcohol use: No    Comment: no heavy use in past  . Drug use: No  . Sexual activity: Not on file  Lifestyle  . Physical activity:    Days per week: Not on file    Minutes per session: Not on file  . Stress: Not on file  Relationships  . Social connections:    Talks on phone: Not on file    Gets together: Not on file    Attends religious service: Not on file    Active member of club or organization: Not on file    Attends meetings of clubs or organizations: Not on file    Relationship status: Not on file  Other Topics Concern  . Not on file  Social History Narrative  . Not on file   Outpatient Encounter Medications as of 05/03/2018  Medication  Sig  . ACCU-CHEK AVIVA PLUS test strip USE TO TEST BLOOD SUGAR 3 TIMES A DAY AS DIRECTED (E11.65)  . aspirin 81 MG tablet Take 81 mg by mouth daily.  . B-D UF III MINI PEN NEEDLES 31G X 5 MM MISC USE FOR INSULIN INJECTIONS 3 TIMES A DAY  . Blood Glucose Monitoring Suppl (ACCU-CHEK AVIVA PLUS) w/Device KIT Use as directed bid E11.65  . budesonide-formoterol (SYMBICORT) 80-4.5 MCG/ACT inhaler Inhale 2 puffs into the lungs 2 (two) times daily as needed (shortness of breath).   . Cholecalciferol (VITAMIN D3) 5000 units CAPS Take 1 capsule (5,000 Units total) by mouth daily.  . enalapril (VASOTEC) 20 MG tablet Take 1 tablet (20 mg total) by mouth daily.  . furosemide (LASIX) 20 MG tablet Take 20 mg by mouth daily.   [name] Kitchen gabapentin (NEURONTIN) 300 MG capsule Take 300 mg by mouth 3 (three) times daily.   [name] Kitchen HYDROcodone-acetaminophen (NORCO/VICODIN) 5-325 MG tablet Take 1 tablet by mouth 2 (two) times daily as needed for moderate pain.  [name] Kitchen insulin aspart protamine - aspart (NOVOLOG MIX 70/30 FLEXPEN) (70-30) 100 UNIT/ML FlexPen Inject 0.2  mLs (20 Units total) into the skin 2 (two) times daily.  [name] Kitchen liraglutide (VICTOZA) 18 MG/3ML SOPN Inject 0.3 mLs (1.8 mg total) into the skin daily before supper.  . metFORMIN (GLUCOPHAGE) 1000 MG tablet Take 1,000 mg by mouth 2 (two) times daily with a meal.  . pravastatin (PRAVACHOL) 20 MG tablet Take 20 mg by mouth daily.  . [DISCONTINUED] Vitamin D, Ergocalciferol, (DRISDOL) 50000 units CAPS capsule TAKE ONE CAPSULE BY MOUTH ONCE EVERY WEEK   No facility-administered encounter medications on file as of 05/03/2018.    ALLERGIES: No Known Allergies VACCINATION STATUS:  There is no immunization history on file for this patient.  Diabetes  He presents for his follow-up diabetic visit. He has type 2 diabetes mellitus. Onset time: He was diagnosed at approximate age of 44 years. His disease course has been improving. There are no hypoglycemic associated symptoms. Pertinent  negatives for hypoglycemia include no confusion, headaches, pallor or seizures. Pertinent negatives for diabetes include no chest pain, no fatigue, no polydipsia, no polyphagia, no polyuria and no weakness. There are no hypoglycemic complications. Symptoms are improving. Risk factors for coronary artery disease include dyslipidemia, diabetes mellitus, hypertension and sedentary lifestyle. Current diabetic treatment includes oral agent (monotherapy). He is compliant with treatment most of the time. His weight is stable. He is following a generally unhealthy diet. He never participates in exercise. His breakfast blood glucose range is generally 130-140 mg/dl. His lunch blood glucose range is generally 110-130 mg/dl. His dinner blood glucose range is generally 130-140 mg/dl. His bedtime blood glucose range is generally 140-180 mg/dl. His overall blood glucose range is 130-140 mg/dl. An ACE inhibitor/angiotensin II receptor blocker is being taken. Eye exam is current.  Hyperlipidemia  This is a chronic problem. The current episode started more than 1 year ago. Exacerbating diseases include diabetes and obesity. Pertinent negatives include no chest pain, myalgias or shortness of breath. Current antihyperlipidemic treatment includes statins.  Hypertension  This is a chronic problem. The current episode started more than 1 year ago. Pertinent negatives include no chest pain, headaches, neck pain, palpitations or shortness of breath. Past treatments include ACE inhibitors.    Review of Systems  Constitutional: Negative for fatigue and unexpected weight change.  HENT: Negative for dental problem, mouth sores and trouble swallowing.   Eyes: Negative for visual disturbance.  Respiratory: Negative for cough, choking, chest tightness, shortness of breath and wheezing.   Cardiovascular: Negative for chest pain, palpitations and leg swelling.  Gastrointestinal: Negative for abdominal distention, abdominal pain,  constipation, diarrhea, nausea and vomiting.  Endocrine: Negative for polydipsia, polyphagia and polyuria.  Genitourinary: Negative for dysuria, flank pain, hematuria and urgency.  Musculoskeletal: Negative for back pain, gait problem, myalgias and neck pain.  Skin: Negative for pallor, rash and wound.  Neurological: Negative for seizures, syncope, weakness, numbness and headaches.  Psychiatric/Behavioral: Negative.  Negative for confusion and dysphoric mood.    Objective:    BP 136/81   Pulse 64   Ht _0  (1.626 m)   Wt 213 lb (96.6 kg)   BMI 36.56 kg/m   Wt Readings from Last 3 Encounters:  05/03/18 213 lb (96.6 kg)  01/13/18 224 lb (101.6 kg)  01/03/18 224 lb (101.6 kg)    Physical Exam  Constitutional: He is oriented to person, place, and time. He appears well-developed and well-nourished. He is cooperative. No distress.  HENT:  Head: Normocephalic and atraumatic.  Eyes: EOM are normal.  Neck: Normal range of motion. Neck supple.  No tracheal deviation present. No thyromegaly present.  Cardiovascular: Normal rate, S1 normal and S2 normal. Exam reveals no gallop.  No murmur heard. Pulses:      Dorsalis pedis pulses are 1+ on the right side, and 1+ on the left side.       Posterior tibial pulses are 1+ on the right side, and 1+ on the left side.  Pulmonary/Chest: Effort normal. No respiratory distress. He has no wheezes.  Abdominal: He exhibits no distension. There is no tenderness. There is no guarding and no CVA tenderness.  Musculoskeletal: He exhibits no edema.       Right shoulder: He exhibits no swelling and no deformity.  Neurological: He is alert and oriented to person, place, and time. He has normal strength and normal reflexes. No cranial nerve deficit or sensory deficit. Gait normal.  Skin: Skin is warm and dry. No rash noted. No cyanosis. Nails show no clubbing.  Psychiatric: He has a normal mood and affect. His speech is normal and behavior is normal. Judgment  and thought content normal. Cognition and memory are normal.    Results for orders placed or performed in visit on 04/26/18  Comprehensive metabolic panel  Result Value Ref Range   Glucose 104 (H) 65 - 99 mg/dL   BUN 16 8 - 27 mg/dL   Creatinine, Ser 1.05 0.76 - 1.27 mg/dL   GFR calc non Af Amer 74 >59 mL/min/1.73   GFR calc Af Amer 85 >59 mL/min/1.73   BUN/Creatinine Ratio 15 10 - 24   Sodium 140 134 - 144 mmol/L   Potassium 5.0 3.5 - 5.2 mmol/L   Chloride 101 96 - 106 mmol/L   CO2 26 20 - 29 mmol/L   Calcium 10.0 8.6 - 10.2 mg/dL   Total Protein 7.1 6.0 - 8.5 g/dL   Albumin 4.6 3.6 - 4.8 g/dL   Globulin, Total 2.5 1.5 - 4.5 g/dL   Albumin/Globulin Ratio 1.8 1.2 - 2.2   Bilirubin Total 0.3 0.0 - 1.2 mg/dL   Alkaline Phosphatase 61 39 - 117 IU/L   AST 23 0 - 40 IU/L   ALT 9 0 - 44 IU/L  Hgb A1c w/o eAG  Result Value Ref Range   Hgb A1c MFr Bld 7.3 (H) 4.8 - 5.6 %  Diabetic Labs (most recent): Lab Results  Component Value Date   HGBA1C 7.3 (H) 04/26/2018   HGBA1C 7.1 (H) 12/28/2017   HGBA1C 8.3 (H) 09/22/2017    Lipid Panel     Component Value Date/Time   CHOL 122 12/28/2017 0813   TRIG 115 12/28/2017 0813   HDL 40 12/28/2017 0813   CHOLHDL 4.0 12/01/2016 0748   LDLCALC 59 12/28/2017 0813    Assessment & Plan:   1. Uncontrolled type 2 diabetes mellitus without complication, with long-term current use of insulin ([place])  Patient came with Improving , near target glucose profile, and improved A1c of 7.3%, progressively improving from 10.1%.      Glucose logs and insulin administration records pertaining to this visit,  to be scanned into patient's records.  Recent labs reviewed. - Patient remains at a high risk for more acute and chronic complications of diabetes which include CAD, CVA, CKD, retinopathy, and neuropathy. These are all discussed in detail with the patient.  - I have re-counseled the patient on diet management and weight loss  by adopting a carbohydrate  restricted / protein rich  Diet. - Patient is advised to stick to a routine mealtimes to  eat 3 meals  a day and avoid unnecessary snacks ( to snack only to correct hypoglycemia).   -  Suggestion is made for him to avoid simple carbohydrates  from his diet including Cakes, Sweet Desserts / Pastries, Ice Cream, Soda (diet and regular), Sweet Tea, Candies, Chips, Cookies, Store Bought Juices, Alcohol in Excess of  1-2 drinks a day, Artificial Sweeteners, and "Sugar-free" Products. This will help patient to have stable blood glucose profile and potentially avoid unintended weight gain.  - I have approached patient with the following individualized plan to manage diabetes and patient agrees.  -He has tight glycemic profile at lunch, I will lower NovoLog 70/30-15 units with breakfast and 20 units with supper   for premeal glucose of 90 or above. -He will continue  strict monitoring of glucose 2 times a day-before breakfast and at bedtime.   -Patient is warned not to take insulin without proper monitoring per orders.  -Patient is encouraged to call clinic for blood glucose levels less than 70 or above 300 mg /dl.  -For better insulin sensitivity I will continue  metformin at 1036m po twice a day with meals.  -He has tolerated Victoza very well, advised to continue 1.8 mg subcutaneously daily .  - I asked him to balance his diet with more protein and less carbs if this is not contracting his belief.  - Patient specific target  for A1c; LDL, HDL, Triglycerides, and  Waist Circumference were discussed in detail.  2) BP/HTN: His blood pressure is controlled to target.   He is advised to continue current medications  including ACEI. I advised him to be consistent in taking his blood pressure medications in the morning.  3) Lipids/HPL: Recent lipid panel showed controlled LDL at 59.  He is advised to continue pravastatin 20 mg p.o. nightly.   4)  Weight/Diet: CDE consult in progress, exercise, and  carbohydrates information provided.  5) Chronic Care/Health Maintenance:  -Patient is on ACEI and Statin medications and encouraged to continue to follow up with Ophthalmology, Podiatrist at least yearly or according to recommendations, and advised to stay away from smoking. I have recommended yearly flu vaccine and pneumonia vaccination at least every 5 years; moderate intensity exercise for up to 150 minutes weekly; and  sleep for at least 7 hours a day.  4. Vitamin D deficiency -He is currently on cholecalciferol 5000 units daily, status post therapy with vitamin D 50,000 units weekly for 12 weeks.    I advised patient to maintain close follow up with his PCP for primary care needs.  - Time spent with the patient: 25 min, of which >50% was spent in reviewing his blood glucose logs , discussing his hypo- and hyper-glycemic episodes, reviewing his current and  previous labs and insulin doses and developing a plan to avoid hypo- and hyper-glycemia. Please refer to Patient Instructions for Blood Glucose Monitoring and Insulin/Medications Dosing Guide"  in media tab for additional information. Patrick Coeparticipated in the discussions, expressed understanding, and voiced agreement with the above plans.  All questions were answered to his satisfaction. he is encouraged to contact clinic should he have any questions or concerns prior to his return visit.   Follow up plan: Return in about 4 months (around 09/03/2018) for follow up with pre-visit labs, meter, and logs.  GGlade Lloyd MD Phone: [phone] Fax: [phone] -  This note was partially dictated with voice recognition software. Similar sounding words can be transcribed inadequately or may  not  be corrected upon review.  05/03/2018, 1:39 PM

## 2018-05-03 NOTE — Patient Instructions (Signed)

## 2018-05-04 DIAGNOSIS — J449 Chronic obstructive pulmonary disease, unspecified: Secondary | ICD-10-CM | POA: Diagnosis not present

## 2018-05-04 DIAGNOSIS — Z1389 Encounter for screening for other disorder: Secondary | ICD-10-CM | POA: Diagnosis not present

## 2018-05-04 DIAGNOSIS — R32 Unspecified urinary incontinence: Secondary | ICD-10-CM | POA: Diagnosis not present

## 2018-05-04 DIAGNOSIS — G894 Chronic pain syndrome: Secondary | ICD-10-CM | POA: Diagnosis not present

## 2018-05-04 DIAGNOSIS — E782 Mixed hyperlipidemia: Secondary | ICD-10-CM | POA: Diagnosis not present

## 2018-05-04 DIAGNOSIS — Z6838 Body mass index (BMI) 38.0-38.9, adult: Secondary | ICD-10-CM | POA: Diagnosis not present

## 2018-05-04 DIAGNOSIS — I10 Essential (primary) hypertension: Secondary | ICD-10-CM | POA: Diagnosis not present

## 2018-05-04 DIAGNOSIS — M792 Neuralgia and neuritis, unspecified: Secondary | ICD-10-CM | POA: Diagnosis not present

## 2018-05-04 DIAGNOSIS — Z0001 Encounter for general adult medical examination with abnormal findings: Secondary | ICD-10-CM | POA: Diagnosis not present

## 2018-05-04 DIAGNOSIS — Z794 Long term (current) use of insulin: Secondary | ICD-10-CM | POA: Diagnosis not present

## 2018-06-09 ENCOUNTER — Telehealth: Payer: Self-pay | Admitting: "Endocrinology

## 2018-06-09 NOTE — Telephone Encounter (Signed)
Patrick Taylor is calling asking if Dr. Dorris Fetch Is going to take him offi his Victoza or not, he only has enough for today and tomorrow and then he will be done, please advise before the office closes today?

## 2018-06-09 NOTE — Telephone Encounter (Signed)
He will still benefit from Victoza if his insurance is helping. If he is not affording , he will be taken off. He can get samples to cover any gaps.

## 2018-06-09 NOTE — Telephone Encounter (Signed)
Pt coming to office to get samples

## 2018-07-19 ENCOUNTER — Other Ambulatory Visit: Payer: Self-pay | Admitting: "Endocrinology

## 2018-07-21 DIAGNOSIS — Z0100 Encounter for examination of eyes and vision without abnormal findings: Secondary | ICD-10-CM | POA: Diagnosis not present

## 2018-07-24 ENCOUNTER — Other Ambulatory Visit: Payer: Self-pay

## 2018-07-24 MED ORDER — METFORMIN HCL 1000 MG PO TABS: 1000.0000 mg | ORAL_TABLET | Freq: Two times a day (BID) | ORAL | 0 refills | Status: DC

## 2018-08-07 DIAGNOSIS — Z1389 Encounter for screening for other disorder: Secondary | ICD-10-CM | POA: Diagnosis not present

## 2018-08-07 DIAGNOSIS — G894 Chronic pain syndrome: Secondary | ICD-10-CM | POA: Diagnosis not present

## 2018-08-07 DIAGNOSIS — Z6837 Body mass index (BMI) 37.0-37.9, adult: Secondary | ICD-10-CM | POA: Diagnosis not present

## 2018-08-07 DIAGNOSIS — E1165 Type 2 diabetes mellitus with hyperglycemia: Secondary | ICD-10-CM | POA: Diagnosis not present

## 2018-08-25 DIAGNOSIS — E1140 Type 2 diabetes mellitus with diabetic neuropathy, unspecified: Secondary | ICD-10-CM | POA: Diagnosis not present

## 2018-08-25 DIAGNOSIS — Z6836 Body mass index (BMI) 36.0-36.9, adult: Secondary | ICD-10-CM | POA: Diagnosis not present

## 2018-08-25 DIAGNOSIS — J449 Chronic obstructive pulmonary disease, unspecified: Secondary | ICD-10-CM | POA: Diagnosis not present

## 2018-08-25 DIAGNOSIS — E6609 Other obesity due to excess calories: Secondary | ICD-10-CM | POA: Diagnosis not present

## 2018-08-25 DIAGNOSIS — E669 Obesity, unspecified: Secondary | ICD-10-CM | POA: Diagnosis not present

## 2018-08-25 DIAGNOSIS — I10 Essential (primary) hypertension: Secondary | ICD-10-CM | POA: Diagnosis not present

## 2018-09-01 ENCOUNTER — Other Ambulatory Visit: Payer: Self-pay | Admitting: "Endocrinology

## 2018-09-01 DIAGNOSIS — E1165 Type 2 diabetes mellitus with hyperglycemia: Secondary | ICD-10-CM | POA: Diagnosis not present

## 2018-09-01 DIAGNOSIS — E782 Mixed hyperlipidemia: Secondary | ICD-10-CM | POA: Diagnosis not present

## 2018-09-01 DIAGNOSIS — Z794 Long term (current) use of insulin: Secondary | ICD-10-CM | POA: Diagnosis not present

## 2018-09-02 LAB — LIPID PANEL W/O CHOL/HDL RATIO
Cholesterol, Total: 122 mg/dL (ref 100–199)
HDL: 41 mg/dL (ref >39)
LDL Calculated: 64 mg/dL (ref 0–99)
Triglycerides: 83 mg/dL (ref 0–149)
VLDL Cholesterol Cal: 17 mg/dL (ref 5–40)

## 2018-09-02 LAB — COMPREHENSIVE METABOLIC PANEL
ALBUMIN: 4.4 g/dL (ref 3.6–4.8)
ALT: 7 IU/L (ref 0–44)
AST: 13 IU/L (ref 0–40)
Albumin/Globulin Ratio: 1.9 (ref 1.2–2.2)
Alkaline Phosphatase: 58 IU/L (ref 39–117)
BILIRUBIN TOTAL: 0.3 mg/dL (ref 0.0–1.2)
BUN / CREAT RATIO: 19 (ref 10–24)
BUN: 19 mg/dL (ref 8–27)
CHLORIDE: 101 mmol/L (ref 96–106)
CO2: 26 mmol/L (ref 20–29)
Calcium: 9.7 mg/dL (ref 8.6–10.2)
Creatinine, Ser: 0.98 mg/dL (ref 0.76–1.27)
GFR, EST AFRICAN AMERICAN: 92 mL/min/{1.73_m2} (ref >59)
GFR, EST NON AFRICAN AMERICAN: 80 mL/min/{1.73_m2} (ref >59)
GLOBULIN, TOTAL: 2.3 g/dL (ref 1.5–4.5)
Glucose: 87 mg/dL (ref 65–99)
Potassium: 4.3 mmol/L (ref 3.5–5.2)
Sodium: 144 mmol/L (ref 134–144)
TOTAL PROTEIN: 6.7 g/dL (ref 6.0–8.5)

## 2018-09-02 LAB — MICROALBUMIN, URINE: Microalbumin, Urine: <3 ug/mL

## 2018-09-02 LAB — HGB A1C W/O EAG: Hgb A1c MFr Bld: 7 % — ABNORMAL HIGH (ref 4.8–5.6)

## 2018-09-02 LAB — TSH: TSH: 1.89 u[IU]/mL (ref 0.450–4.500)

## 2018-09-02 LAB — T4, FREE: FREE T4: 0.96 ng/dL (ref 0.82–1.77)

## 2018-09-06 ENCOUNTER — Ambulatory Visit (INDEPENDENT_AMBULATORY_CARE_PROVIDER_SITE_OTHER): Payer: Medicare HMO | Admitting: "Endocrinology

## 2018-09-06 ENCOUNTER — Encounter: Payer: Self-pay | Admitting: "Endocrinology

## 2018-09-06 VITALS — BP 138/84 | HR 74 | Ht 64.0 in | Wt 206.0 lb

## 2018-09-06 DIAGNOSIS — IMO0001 Reserved for inherently not codable concepts without codable children: Secondary | ICD-10-CM

## 2018-09-06 DIAGNOSIS — Z794 Long term (current) use of insulin: Secondary | ICD-10-CM

## 2018-09-06 DIAGNOSIS — I10 Essential (primary) hypertension: Secondary | ICD-10-CM | POA: Diagnosis not present

## 2018-09-06 DIAGNOSIS — E782 Mixed hyperlipidemia: Secondary | ICD-10-CM

## 2018-09-06 DIAGNOSIS — E1140 Type 2 diabetes mellitus with diabetic neuropathy, unspecified: Secondary | ICD-10-CM | POA: Diagnosis not present

## 2018-09-06 DIAGNOSIS — E1165 Type 2 diabetes mellitus with hyperglycemia: Secondary | ICD-10-CM | POA: Diagnosis not present

## 2018-09-06 NOTE — Progress Notes (Signed)
Endocrinology follow-up note    Subjective:    Patient ID: Patrick Taylor, male    DOB: 10/21/1951,    Past Medical History:  Diagnosis Date  . Anemia   . Asthma   . Carpal tunnel syndrome   . Chronic back pain   . Colon polyps   . DM (diabetes mellitus) ([place])   . HTN (hypertension)   . Hyperlipidemia    Past Surgical History:  Procedure Laterality Date  . ABDOMINAL HERNIA REPAIR    . COLONOSCOPY  01/24/09   rectal polyp/adenomatous poylps/tubulovillous  . COLONOSCOPY  03/02/2012   Procedure: COLONOSCOPY;  Surgeon: Daneil Dolin, MD;  Location: [facility];  Service: Endoscopy;  Laterality: N/A;  11:00   Social History   Socioeconomic History  . Marital status: Married    Spouse name: Not on file  . Number of children: Not on file  . Years of education: Not on file  . Highest education level: Not on file  Occupational History  . Occupation: works at [employer]  . Financial resource strain: Not on file  . Food insecurity:    Worry: Not on file    Inability: Not on file  . Transportation needs:    Medical: Not on file    Non-medical: Not on file  Tobacco Use  . Smoking status: Former Smoker    Last attempt to quit: 02/18/1999    Years since quitting: 19.5  . Smokeless tobacco: Never Used  Substance and Sexual Activity  . Alcohol use: No    Comment: no heavy use in past  . Drug use: No  . Sexual activity: Not on file  Lifestyle  . Physical activity:    Days per week: Not on file    Minutes per session: Not on file  . Stress: Not on file  Relationships  . Social connections:    Talks on phone: Not on file    Gets together: Not on file    Attends religious service: Not on file    Active member of club or organization: Not on file    Attends meetings of clubs or organizations: Not on file    Relationship status: Not on file  Other Topics Concern  . Not on file  Social History Narrative  . Not on file   Outpatient Encounter  Medications as of 09/06/2018  Medication Sig  . ACCU-CHEK AVIVA PLUS test strip USE TO TEST BLOOD SUGAR 3 TIMES A DAY AS DIRECTED (E11.65)  . aspirin 81 MG tablet Take 81 mg by mouth daily.  . B-D UF III MINI PEN NEEDLES 31G X 5 MM MISC USE FOR INSULIN INJECTIONS 3 TIMES A DAY  . Blood Glucose Monitoring Suppl (ACCU-CHEK AVIVA PLUS) w/Device KIT Use as directed bid E11.65  . budesonide-formoterol (SYMBICORT) 80-4.5 MCG/ACT inhaler Inhale 2 puffs into the lungs 2 (two) times daily as needed (shortness of breath).   . Cholecalciferol (VITAMIN D3) 5000 units CAPS Take 1 capsule (5,000 Units total) by mouth daily.  . enalapril (VASOTEC) 20 MG tablet Take 1 tablet (20 mg total) by mouth daily.  . furosemide (LASIX) 20 MG tablet Take 20 mg by mouth daily.   [name] Kitchen gabapentin (NEURONTIN) 300 MG capsule Take 300 mg by mouth 3 (three) times daily.   [name] Kitchen HYDROcodone-acetaminophen (NORCO/VICODIN) 5-325 MG tablet Take 1 tablet by mouth 2 (two) times daily as needed for moderate pain.  [name] Kitchen insulin aspart protamine - aspart (NOVOLOG MIX 70/30 FLEXPEN) (70-30) 100  UNIT/ML FlexPen Inject 0.2 mLs (20 Units total) into the skin 2 (two) times daily.  [name] Kitchen liraglutide (VICTOZA) 18 MG/3ML SOPN Inject 0.3 mLs (1.8 mg total) into the skin daily before supper.  . metFORMIN (GLUCOPHAGE) 1000 MG tablet Take 1 tablet (1,000 mg total) by mouth 2 (two) times daily with a meal.  . pravastatin (PRAVACHOL) 20 MG tablet Take 20 mg by mouth daily.   No facility-administered encounter medications on file as of 09/06/2018.    ALLERGIES: No Known Allergies VACCINATION STATUS:  There is no immunization history on file for this patient.  Diabetes  He presents for his follow-up diabetic visit. He has type 2 diabetes mellitus. Onset time: He was diagnosed at approximate age of 31 years. His disease course has been improving. There are no hypoglycemic associated symptoms. Pertinent negatives for hypoglycemia include no confusion, headaches,  pallor or seizures. Pertinent negatives for diabetes include no chest pain, no fatigue, no polydipsia, no polyphagia, no polyuria and no weakness. There are no hypoglycemic complications. Symptoms are improving. Risk factors for coronary artery disease include dyslipidemia, diabetes mellitus, hypertension and sedentary lifestyle. Current diabetic treatment includes oral agent (monotherapy). He is compliant with treatment most of the time. His weight is decreasing steadily. He is following a generally unhealthy diet. He rarely participates in exercise. His breakfast blood glucose range is generally 130-140 mg/dl. His lunch blood glucose range is generally 130-140 mg/dl. His dinner blood glucose range is generally 130-140 mg/dl. His bedtime blood glucose range is generally 130-140 mg/dl. His overall blood glucose range is 130-140 mg/dl. An ACE inhibitor/angiotensin II receptor blocker is being taken. Eye exam is current.  Hyperlipidemia  This is a chronic problem. The current episode started more than 1 year ago. Exacerbating diseases include diabetes and obesity. Pertinent negatives include no chest pain, myalgias or shortness of breath. Current antihyperlipidemic treatment includes statins. Risk factors for coronary artery disease include diabetes mellitus, male sex, a sedentary lifestyle and obesity.  Hypertension  This is a chronic problem. The current episode started more than 1 year ago. Pertinent negatives include no chest pain, headaches, neck pain, palpitations or shortness of breath. Risk factors for coronary artery disease include dyslipidemia, diabetes mellitus, obesity, male gender, sedentary lifestyle and smoking/tobacco exposure. Past treatments include ACE inhibitors.    Review of Systems  Constitutional: Negative for fatigue and unexpected weight change.  HENT: Negative for dental problem, mouth sores and trouble swallowing.   Eyes: Negative for visual disturbance.  Respiratory: Negative  for cough, choking, chest tightness, shortness of breath and wheezing.   Cardiovascular: Negative for chest pain, palpitations and leg swelling.  Gastrointestinal: Negative for abdominal distention, abdominal pain, constipation, diarrhea, nausea and vomiting.  Endocrine: Negative for polydipsia, polyphagia and polyuria.  Genitourinary: Negative for dysuria, flank pain, hematuria and urgency.  Musculoskeletal: Negative for back pain, gait problem, myalgias and neck pain.  Skin: Negative for pallor, rash and wound.  Neurological: Negative for seizures, syncope, weakness, numbness and headaches.  Psychiatric/Behavioral: Negative.  Negative for confusion and dysphoric mood.    Objective:    BP 138/84   Pulse 74   Ht '5\' 4"'  (1.626 m)   Wt 206 lb (93.4 kg)   BMI 35.36 kg/m   Wt Readings from Last 3 Encounters:  09/06/18 206 lb (93.4 kg)  05/03/18 213 lb (96.6 kg)  01/13/18 224 lb (101.6 kg)    Physical Exam  Constitutional: He is oriented to person, place, and time. He appears well-developed and well-nourished. He is  cooperative. No distress.  HENT:  Head: Normocephalic and atraumatic.  Eyes: EOM are normal.  Neck: Normal range of motion. Neck supple. No tracheal deviation present. No thyromegaly present.  Cardiovascular: Normal rate, S1 normal and S2 normal. Exam reveals no gallop.  No murmur heard. Pulses:      Dorsalis pedis pulses are 1+ on the right side, and 1+ on the left side.       Posterior tibial pulses are 1+ on the right side, and 1+ on the left side.  Pulmonary/Chest: Effort normal. No respiratory distress. He has no wheezes.  Abdominal: He exhibits no distension. There is no tenderness. There is no guarding and no CVA tenderness.  Musculoskeletal: He exhibits no edema.       Right shoulder: He exhibits no swelling and no deformity.  Neurological: He is alert and oriented to person, place, and time. He has normal strength and normal reflexes. No cranial nerve deficit or  sensory deficit. Gait normal.  Skin: Skin is warm and dry. No rash noted. No cyanosis. Nails show no clubbing.  Psychiatric: He has a normal mood and affect. His speech is normal and behavior is normal. Judgment and thought content normal. Cognition and memory are normal.    Results for orders placed or performed in visit on 09/01/18  Comprehensive metabolic panel  Result Value Ref Range   Glucose 87 65 - 99 mg/dL   BUN 19 8 - 27 mg/dL   Creatinine, Ser 0.98 0.76 - 1.27 mg/dL   GFR calc non Af Amer 80 >59 mL/min/1.73   GFR calc Af Amer 92 >59 mL/min/1.73   BUN/Creatinine Ratio 19 10 - 24   Sodium 144 134 - 144 mmol/L   Potassium 4.3 3.5 - 5.2 mmol/L   Chloride 101 96 - 106 mmol/L   CO2 26 20 - 29 mmol/L   Calcium 9.7 8.6 - 10.2 mg/dL   Total Protein 6.7 6.0 - 8.5 g/dL   Albumin 4.4 3.6 - 4.8 g/dL   Globulin, Total 2.3 1.5 - 4.5 g/dL   Albumin/Globulin Ratio 1.9 1.2 - 2.2   Bilirubin Total 0.3 0.0 - 1.2 mg/dL   Alkaline Phosphatase 58 39 - 117 IU/L   AST 13 0 - 40 IU/L   ALT 7 0 - 44 IU/L  Lipid Panel w/o Chol/HDL Ratio  Result Value Ref Range   Cholesterol, Total 122 100 - 199 mg/dL   Triglycerides 83 0 - 149 mg/dL   HDL 41 >39 mg/dL   VLDL Cholesterol Cal 17 5 - 40 mg/dL   LDL Calculated 64 0 - 99 mg/dL  Hgb A1c w/o eAG  Result Value Ref Range   Hgb A1c MFr Bld 7.0 (H) 4.8 - 5.6 %  T4, free  Result Value Ref Range   Free T4 0.96 0.82 - 1.77 ng/dL  TSH  Result Value Ref Range   TSH 1.890 0.450 - 4.500 uIU/mL  Microalbumin, urine  Result Value Ref Range   Microalbumin, Urine <3.0 Not Estab. ug/mL  Diabetic Labs (most recent): Lab Results  Component Value Date   HGBA1C 7.0 (H) 09/01/2018   HGBA1C 7.3 (H) 04/26/2018   HGBA1C 7.1 (H) 12/28/2017    Lipid Panel     Component Value Date/Time   CHOL 122 09/01/2018 0855   TRIG 83 09/01/2018 0855   HDL 41 09/01/2018 0855   CHOLHDL 4.0 12/01/2016 0748   LDLCALC 64 09/01/2018 0855    Assessment & Plan:   1.  Uncontrolled type 2  diabetes mellitus without complication, with long-term current use of insulin ([place])  He came with improving, near target glycemic profile and his A1c is 7%, progressively improving from 10.1%.      Glucose logs and insulin administration records pertaining to this visit,  to be scanned into patient's records.  Recent labs reviewed. - Patient remains at a high risk for more acute and chronic complications of diabetes which include CAD, CVA, CKD, retinopathy, and neuropathy. These are all discussed in detail with the patient.  - I have re-counseled the patient on diet management and weight loss  by adopting a carbohydrate restricted / protein rich  Diet. - Patient is advised to stick to a routine mealtimes to eat 3 meals  a day and avoid unnecessary snacks ( to snack only to correct hypoglycemia).  -He admits to dietary indiscretions including consumption of sweetened beverages. -  Suggestion is made for him to avoid simple carbohydrates  from his diet including Cakes, Sweet Desserts / Pastries, Ice Cream, Soda (diet and regular), Sweet Tea, Candies, Chips, Cookies, Store Bought Juices, Alcohol in Excess of  1-2 drinks a day, Artificial Sweeteners, and "Sugar-free" Products. This will help patient to have stable blood glucose profile and potentially avoid unintended weight gain.  - I have approached patient with the following individualized plan to manage diabetes and patient agrees.  -Given his presentation with near target glycemic profile both fasting and postprandial, I advised him to continue NovoLog 70/30  18 units with breakfast and 20 units with supper   for premeal glucose of 90 or above. -He will continue  strict monitoring of glucose 2 times a day-before breakfast and at bedtime.   -Patient is warned not to take insulin without proper monitoring per orders.  -Patient is encouraged to call clinic for blood glucose levels less than 70 or above 300 mg /dl.  -He is  tolerating metformin, advised to continue metformin 1000 mg p.o. twice daily-after breakfast and supper.   -He has tolerated Victoza very well, advised to continue 1.8 mg subcutaneously daily .  - Patient specific target  for A1c; LDL, HDL, Triglycerides, and  Waist Circumference were discussed in detail.  2) BP/HTN: His blood pressure is controlled to target.   He is advised to continue current medications  including ACEI. I advised him to be consistent in taking his blood pressure medications in the morning.  3) Lipids/HPL: Recent lipid panel showed controlled LDL at 64.  He is advised to continue pravastatin 20 mg p.o. nightly.    4)  Weight/Diet: CDE consult in progress, exercise, and carbohydrates information provided.  5) Chronic Care/Health Maintenance:  -Patient is on ACEI and Statin medications and encouraged to continue to follow up with Ophthalmology, Podiatrist at least yearly or according to recommendations, and advised to stay away from smoking. I have recommended yearly flu vaccine and pneumonia vaccination at least every 5 years; moderate intensity exercise for up to 150 minutes weekly; and  sleep for at least 7 hours a day.  4. Vitamin D deficiency -He is currently on cholecalciferol 5000 units daily, status post therapy with vitamin D 50,000 units weekly for 12 weeks.    I advised patient to maintain close follow up with his PCP for primary care needs.  - Time spent with the patient: 25 min, of which >50% was spent in reviewing his blood glucose logs , discussing his hypo- and hyper-glycemic episodes, reviewing his current and  previous labs and insulin doses and developing a  plan to avoid hypo- and hyper-glycemia. Please refer to Patient Instructions for Blood Glucose Monitoring and Insulin/Medications Dosing Guide"  in media tab for additional information. Patrick Taylor participated in the discussions, expressed understanding, and voiced agreement with the above plans.   All questions were answered to his satisfaction. he is encouraged to contact clinic should he have any questions or concerns prior to his return visit.  Follow up plan: Return in about 4 months (around 01/06/2019) for Follow up with Pre-visit Labs, Meter, and Logs.  Glade Lloyd, MD Phone: [phone]  Fax: [phone]  -  This note was partially dictated with voice recognition software. Similar sounding words can be transcribed inadequately or may not  be corrected upon review.  09/06/2018, 1:19 PM

## 2018-09-06 NOTE — Patient Instructions (Signed)

## 2018-09-25 ENCOUNTER — Other Ambulatory Visit: Payer: Self-pay

## 2018-09-25 MED ORDER — INSULIN ASPART PROT & ASPART (70-30 MIX) 100 UNIT/ML PEN: 20.0000 [IU] | PEN_INJECTOR | Freq: Two times a day (BID) | SUBCUTANEOUS | 2 refills | Status: DC

## 2018-09-25 MED ORDER — LIRAGLUTIDE 18 MG/3ML ~~LOC~~ SOPN: 1.8000 mg | PEN_INJECTOR | Freq: Every day | SUBCUTANEOUS | 0 refills | Status: DC

## 2018-10-13 ENCOUNTER — Other Ambulatory Visit: Payer: Self-pay | Admitting: "Endocrinology

## 2018-10-15 ENCOUNTER — Other Ambulatory Visit: Payer: Self-pay | Admitting: "Endocrinology

## 2018-10-23 ENCOUNTER — Other Ambulatory Visit: Payer: Self-pay

## 2018-10-27 DIAGNOSIS — Z6837 Body mass index (BMI) 37.0-37.9, adult: Secondary | ICD-10-CM | POA: Diagnosis not present

## 2018-10-27 DIAGNOSIS — B351 Tinea unguium: Secondary | ICD-10-CM | POA: Diagnosis not present

## 2018-10-27 DIAGNOSIS — G894 Chronic pain syndrome: Secondary | ICD-10-CM | POA: Diagnosis not present

## 2018-10-27 DIAGNOSIS — B353 Tinea pedis: Secondary | ICD-10-CM | POA: Diagnosis not present

## 2018-10-27 DIAGNOSIS — E782 Mixed hyperlipidemia: Secondary | ICD-10-CM | POA: Diagnosis not present

## 2018-10-27 DIAGNOSIS — I10 Essential (primary) hypertension: Secondary | ICD-10-CM | POA: Diagnosis not present

## 2018-10-27 DIAGNOSIS — Z23 Encounter for immunization: Secondary | ICD-10-CM | POA: Diagnosis not present

## 2018-10-27 DIAGNOSIS — E1140 Type 2 diabetes mellitus with diabetic neuropathy, unspecified: Secondary | ICD-10-CM | POA: Diagnosis not present

## 2018-11-16 ENCOUNTER — Other Ambulatory Visit: Payer: Self-pay | Admitting: "Endocrinology

## 2019-01-02 ENCOUNTER — Other Ambulatory Visit: Payer: Self-pay | Admitting: "Endocrinology

## 2019-01-02 DIAGNOSIS — Z794 Long term (current) use of insulin: Secondary | ICD-10-CM | POA: Diagnosis not present

## 2019-01-02 DIAGNOSIS — E1165 Type 2 diabetes mellitus with hyperglycemia: Secondary | ICD-10-CM | POA: Diagnosis not present

## 2019-01-02 DIAGNOSIS — E119 Type 2 diabetes mellitus without complications: Secondary | ICD-10-CM | POA: Diagnosis not present

## 2019-01-03 LAB — COMPREHENSIVE METABOLIC PANEL
ALBUMIN: 4.5 g/dL (ref 3.8–4.8)
ALT: 8 IU/L (ref 0–44)
AST: 16 IU/L (ref 0–40)
Albumin/Globulin Ratio: 1.9 (ref 1.2–2.2)
Alkaline Phosphatase: 54 IU/L (ref 39–117)
BUN / CREAT RATIO: 20 (ref 10–24)
BUN: 21 mg/dL (ref 8–27)
Bilirubin Total: 0.3 mg/dL (ref 0.0–1.2)
CALCIUM: 9.5 mg/dL (ref 8.6–10.2)
CO2: 24 mmol/L (ref 20–29)
CREATININE: 1.07 mg/dL (ref 0.76–1.27)
Chloride: 102 mmol/L (ref 96–106)
GFR calc Af Amer: 83 mL/min/{1.73_m2} (ref >59)
GFR, EST NON AFRICAN AMERICAN: 71 mL/min/{1.73_m2} (ref >59)
GLOBULIN, TOTAL: 2.4 g/dL (ref 1.5–4.5)
Glucose: 154 mg/dL — ABNORMAL HIGH (ref 65–99)
Potassium: 4.7 mmol/L (ref 3.5–5.2)
SODIUM: 141 mmol/L (ref 134–144)
TOTAL PROTEIN: 6.9 g/dL (ref 6.0–8.5)

## 2019-01-03 LAB — HGB A1C W/O EAG: Hgb A1c MFr Bld: 6.6 % — ABNORMAL HIGH (ref 4.8–5.6)

## 2019-01-07 ENCOUNTER — Other Ambulatory Visit: Payer: Self-pay | Admitting: "Endocrinology

## 2019-01-08 ENCOUNTER — Encounter: Payer: Self-pay | Admitting: "Endocrinology

## 2019-01-08 ENCOUNTER — Ambulatory Visit (INDEPENDENT_AMBULATORY_CARE_PROVIDER_SITE_OTHER): Payer: Medicare Other | Admitting: "Endocrinology

## 2019-01-08 VITALS — BP 145/92 | HR 54 | Ht 64.0 in | Wt 207.0 lb

## 2019-01-08 DIAGNOSIS — E1165 Type 2 diabetes mellitus with hyperglycemia: Secondary | ICD-10-CM | POA: Diagnosis not present

## 2019-01-08 DIAGNOSIS — E782 Mixed hyperlipidemia: Secondary | ICD-10-CM

## 2019-01-08 DIAGNOSIS — Z794 Long term (current) use of insulin: Secondary | ICD-10-CM | POA: Diagnosis not present

## 2019-01-08 DIAGNOSIS — I10 Essential (primary) hypertension: Secondary | ICD-10-CM | POA: Diagnosis not present

## 2019-01-08 DIAGNOSIS — IMO0001 Reserved for inherently not codable concepts without codable children: Secondary | ICD-10-CM

## 2019-01-08 MED ORDER — INSULIN ISOPHANE & REGULAR (HUMAN 70-30)100 UNIT/ML KWIKPEN: 18.0000 [IU] | PEN_INJECTOR | Freq: Two times a day (BID) | SUBCUTANEOUS | 3 refills | Status: DC

## 2019-01-08 NOTE — Patient Instructions (Signed)

## 2019-01-08 NOTE — Progress Notes (Signed)
Endocrinology follow-up note    Subjective:    Patient ID: Patrick Taylor, male    DOB: Oct 06, 1951,    Past Medical History:  Diagnosis Date  . Anemia   . Asthma   . Carpal tunnel syndrome   . Chronic back pain   . Colon polyps   . DM (diabetes mellitus) ([place])   . HTN (hypertension)   . Hyperlipidemia    Past Surgical History:  Procedure Laterality Date  . ABDOMINAL HERNIA REPAIR    . COLONOSCOPY  01/24/09   rectal polyp/adenomatous poylps/tubulovillous  . COLONOSCOPY  03/02/2012   Procedure: COLONOSCOPY;  Surgeon: Daneil Dolin, MD;  Location: [facility];  Service: Endoscopy;  Laterality: N/A;  11:00   Social History   Socioeconomic History  . Marital status: Married    Spouse name: Not on file  . Number of children: Not on file  . Years of education: Not on file  . Highest education level: Not on file  Occupational History  . Occupation: works at [employer]  . Financial resource strain: Not on file  . Food insecurity:    Worry: Not on file    Inability: Not on file  . Transportation needs:    Medical: Not on file    Non-medical: Not on file  Tobacco Use  . Smoking status: Former Smoker    Last attempt to quit: 02/18/1999    Years since quitting: 19.9  . Smokeless tobacco: Never Used  Substance and Sexual Activity  . Alcohol use: No    Comment: no heavy use in past  . Drug use: No  . Sexual activity: Not on file  Lifestyle  . Physical activity:    Days per week: Not on file    Minutes per session: Not on file  . Stress: Not on file  Relationships  . Social connections:    Talks on phone: Not on file    Gets together: Not on file    Attends religious service: Not on file    Active member of club or organization: Not on file    Attends meetings of clubs or organizations: Not on file    Relationship status: Not on file  Other Topics Concern  . Not on file  Social History Narrative  . Not on file   Outpatient Encounter  Medications as of 01/08/2019  Medication Sig  . ACCU-CHEK AVIVA PLUS test strip USE TO TEST BLOOD SUGAR 3 TIMES A DAY AS DIRECTED (E11.65)  . aspirin 81 MG tablet Take 81 mg by mouth daily.  . B-D UF III MINI PEN NEEDLES 31G X 5 MM MISC USE FOR INSULIN INJECTIONS 3 TIMES A DAY  . Blood Glucose Monitoring Suppl (ACCU-CHEK AVIVA PLUS) w/Device KIT Use as directed bid E11.65  . budesonide-formoterol (SYMBICORT) 80-4.5 MCG/ACT inhaler Inhale 2 puffs into the lungs 2 (two) times daily as needed (shortness of breath).   . Cholecalciferol (VITAMIN D3) 5000 units CAPS Take 1 capsule (5,000 Units total) by mouth daily.  . enalapril (VASOTEC) 20 MG tablet Take 1 tablet (20 mg total) by mouth daily.  . furosemide (LASIX) 20 MG tablet Take 20 mg by mouth daily.   [name] Kitchen gabapentin (NEURONTIN) 300 MG capsule Take 300 mg by mouth 3 (three) times daily.   [name] Kitchen HYDROcodone-acetaminophen (NORCO/VICODIN) 5-325 MG tablet Take 1 tablet by mouth 2 (two) times daily as needed for moderate pain.  . Insulin Isophane & Regular Human (NOVOLIN 70/30 FLEXPEN) (70-30) 100 UNIT/ML  PEN Inject 18 Units into the skin 2 (two) times daily before a meal.  . metFORMIN (GLUCOPHAGE) 1000 MG tablet TAKE 1 TABLET (1,000 MG TOTAL) BY MOUTH 2 (TWO) TIMES DAILY WITH A MEAL.  . pravastatin (PRAVACHOL) 20 MG tablet Take 20 mg by mouth daily.  . [DISCONTINUED] insulin aspart protamine - aspart (NOVOLOG MIX 70/30 FLEXPEN) (70-30) 100 UNIT/ML FlexPen Inject 0.2 mLs (20 Units total) into the skin 2 (two) times daily.  . [DISCONTINUED] VICTOZA 18 MG/3ML SOPN INJECT 0.3 MLS (1.8 MG TOTAL) INTO THE SKIN DAILY BEFORE SUPPER   No facility-administered encounter medications on file as of 01/08/2019.    ALLERGIES: No Known Allergies VACCINATION STATUS:  There is no immunization history on file for this patient.  Diabetes  He presents for his follow-up diabetic visit. He has type 2 diabetes mellitus. Onset time: He was diagnosed at approximate age of 46  years. His disease course has been improving. There are no hypoglycemic associated symptoms. Pertinent negatives for hypoglycemia include no confusion, headaches, pallor or seizures. Pertinent negatives for diabetes include no chest pain, no fatigue, no polydipsia, no polyphagia, no polyuria and no weakness. There are no hypoglycemic complications. Symptoms are improving. Risk factors for coronary artery disease include dyslipidemia, diabetes mellitus, hypertension and sedentary lifestyle. Current diabetic treatment includes oral agent (monotherapy). He is compliant with treatment most of the time. His weight is stable. He is following a generally unhealthy diet. He rarely participates in exercise. His breakfast blood glucose range is generally 130-140 mg/dl. His lunch blood glucose range is generally 130-140 mg/dl. His dinner blood glucose range is generally 130-140 mg/dl. His bedtime blood glucose range is generally 130-140 mg/dl. His overall blood glucose range is 130-140 mg/dl. An ACE inhibitor/angiotensin II receptor blocker is being taken. Eye exam is current.  Hyperlipidemia  This is a chronic problem. The current episode started more than 1 year ago. Exacerbating diseases include diabetes and obesity. Pertinent negatives include no chest pain, myalgias or shortness of breath. Current antihyperlipidemic treatment includes statins. Risk factors for coronary artery disease include diabetes mellitus, male sex, a sedentary lifestyle and obesity.  Hypertension  This is a chronic problem. The current episode started more than 1 year ago. Pertinent negatives include no chest pain, headaches, neck pain, palpitations or shortness of breath. Risk factors for coronary artery disease include dyslipidemia, diabetes mellitus, obesity, male gender, sedentary lifestyle and smoking/tobacco exposure. Past treatments include ACE inhibitors.    Review of Systems  Constitutional: Negative for fatigue and unexpected weight  change.  HENT: Negative for dental problem, mouth sores and trouble swallowing.   Eyes: Negative for visual disturbance.  Respiratory: Negative for cough, choking, chest tightness, shortness of breath and wheezing.   Cardiovascular: Negative for chest pain, palpitations and leg swelling.  Gastrointestinal: Negative for abdominal distention, abdominal pain, constipation, diarrhea, nausea and vomiting.  Endocrine: Negative for polydipsia, polyphagia and polyuria.  Genitourinary: Negative for dysuria, flank pain, hematuria and urgency.  Musculoskeletal: Negative for back pain, gait problem, myalgias and neck pain.  Skin: Negative for pallor, rash and wound.  Neurological: Negative for seizures, syncope, weakness, numbness and headaches.  Psychiatric/Behavioral: Negative.  Negative for confusion and dysphoric mood.    Objective:    BP (!) 145/92   Pulse (!) 54   Ht '5\' 4"'  (1.626 m)   Wt 207 lb (93.9 kg)   BMI 35.53 kg/m   Wt Readings from Last 3 Encounters:  01/08/19 207 lb (93.9 kg)  09/06/18 206 lb (93.4  kg)  05/03/18 213 lb (96.6 kg)    Physical Exam  Constitutional: He is oriented to person, place, and time. He appears well-developed and well-nourished. He is cooperative. No distress.  HENT:  Head: Normocephalic and atraumatic.  Eyes: EOM are normal.  Neck: Normal range of motion. Neck supple. No tracheal deviation present. No thyromegaly present.  Cardiovascular: Normal rate, S1 normal and S2 normal. Exam reveals no gallop.  No murmur heard. Pulses:      Dorsalis pedis pulses are 1+ on the right side and 1+ on the left side.       Posterior tibial pulses are 1+ on the right side and 1+ on the left side.  Pulmonary/Chest: Effort normal. No respiratory distress. He has no wheezes.  Abdominal: He exhibits no distension. There is no abdominal tenderness. There is no guarding and no CVA tenderness.  Musculoskeletal:        General: No edema.     Right shoulder: He exhibits no  swelling and no deformity.  Neurological: He is alert and oriented to person, place, and time. He has normal strength and normal reflexes. No cranial nerve deficit or sensory deficit. Gait normal.  Skin: Skin is warm and dry. No rash noted. No cyanosis. Nails show no clubbing.  Psychiatric: He has a normal mood and affect. His speech is normal and behavior is normal. Judgment and thought content normal. Cognition and memory are normal.    Results for orders placed or performed in visit on 01/02/19  Comprehensive metabolic panel  Result Value Ref Range   Glucose 154 (H) 65 - 99 mg/dL   BUN 21 8 - 27 mg/dL   Creatinine, Ser 1.07 0.76 - 1.27 mg/dL   GFR calc non Af Amer 71 >59 mL/min/1.73   GFR calc Af Amer 83 >59 mL/min/1.73   BUN/Creatinine Ratio 20 10 - 24   Sodium 141 134 - 144 mmol/L   Potassium 4.7 3.5 - 5.2 mmol/L   Chloride 102 96 - 106 mmol/L   CO2 24 20 - 29 mmol/L   Calcium 9.5 8.6 - 10.2 mg/dL   Total Protein 6.9 6.0 - 8.5 g/dL   Albumin 4.5 3.8 - 4.8 g/dL   Globulin, Total 2.4 1.5 - 4.5 g/dL   Albumin/Globulin Ratio 1.9 1.2 - 2.2   Bilirubin Total 0.3 0.0 - 1.2 mg/dL   Alkaline Phosphatase 54 39 - 117 IU/L   AST 16 0 - 40 IU/L   ALT 8 0 - 44 IU/L  Hgb A1c w/o eAG  Result Value Ref Range   Hgb A1c MFr Bld 6.6 (H) 4.8 - 5.6 %  Diabetic Labs (most recent): Lab Results  Component Value Date   HGBA1C 6.6 (H) 01/02/2019   HGBA1C 7.0 (H) 09/01/2018   HGBA1C 7.3 (H) 04/26/2018    Lipid Panel     Component Value Date/Time   CHOL 122 09/01/2018 0855   TRIG 83 09/01/2018 0855   HDL 41 09/01/2018 0855   CHOLHDL 4.0 12/01/2016 0748   LDLCALC 64 09/01/2018 0855    Assessment & Plan:   1. Uncontrolled type 2 diabetes mellitus without complication, with long-term current use of insulin ([place])  He came with continued improvement in his glycemic profile and A1c of 6.6%, progressively improved from 10.1%.    Glucose logs and insulin administration records pertaining to  this visit,  to be scanned into patient's records.  Recent labs reviewed. - Patient remains at a high risk for more acute and chronic complications  of diabetes which include CAD, CVA, CKD, retinopathy, and neuropathy. These are all discussed in detail with the patient.  - I have re-counseled the patient on diet management and weight loss  by adopting a carbohydrate restricted / protein rich  Diet. - Patient is advised to stick to a routine mealtimes to eat 3 meals  a day and avoid unnecessary snacks ( to snack only to correct hypoglycemia).  -He admits to dietary indiscretions including consumption of sweetened beverages. -  Suggestion is made for him to avoid simple carbohydrates  from his diet including Cakes, Sweet Desserts / Pastries, Ice Cream, Soda (diet and regular), Sweet Tea, Candies, Chips, Cookies, Store Bought Juices, Alcohol in Excess of  1-2 drinks a day, Artificial Sweeteners, and "Sugar-free" Products. This will help patient to have stable blood glucose profile and potentially avoid unintended weight gain.  - I have approached patient with the following individualized plan to manage diabetes and patient agrees.  -He expresses his concern that he will no longer afford NovoLog 70/30.  He is asking for cheaper alternative.   -I discussed and prescribed Novolin 70/30 to replace his NovoLog 70/30.   -He is advised to use Novolin 70/30 18 units with breakfast and 18 units with supper for pre-meal blood glucose above 90 mg/dL.   -He will continue  strict monitoring of glucose 2 times a day-before breakfast and at bedtime, and as needed.   -Patient is warned not to take insulin without proper monitoring per orders.  -Patient is encouraged to call clinic for blood glucose levels less than 70 or above 300 mg /dl.  -He is tolerating metformin, he is advised to continue metformin 1000 mg p.o. twice daily -after breakfast and supper.   -He has tolerated Victoza very well, however reports that  he will no longer afford this medication.  This will be discontinued.   - Patient specific target  for A1c; LDL, HDL, Triglycerides, and  Waist Circumference were discussed in detail.  2) BP/HTN: His blood pressure is not controlled to target.   He is advised to continue current medications  including ACEI. I advised him to be consistent in taking his blood pressure medications in the morning.  3) Lipids/HPL: Recent lipid panel showed controlled LDL at 64.  He is advised to continue pravastatin 20 mg p.o. nightly.     4)  Weight/Diet: CDE consult in progress, exercise, and carbohydrates information provided.  5) Chronic Care/Health Maintenance:  -Patient is on ACEI and Statin medications and encouraged to continue to follow up with Ophthalmology, Podiatrist at least yearly or according to recommendations, and advised to stay away from smoking. I have recommended yearly flu vaccine and pneumonia vaccination at least every 5 years; moderate intensity exercise for up to 150 minutes weekly; and  sleep for at least 7 hours a day.  4. Vitamin D deficiency -He is currently on cholecalciferol 5000 units daily, status post therapy with vitamin D 50,000 units weekly for 12 weeks.    I advised patient to maintain close follow up with his PCP for primary care needs.  - Time spent with the patient: 25 min, of which >50% was spent in reviewing his blood glucose logs , discussing his hypoglycemia and hyperglycemia episodes, reviewing his current and  previous labs / studies and medications  doses and developing a plan to avoid hypoglycemia and hyperglycemia. Please refer to Patient Instructions for Blood Glucose Monitoring and Insulin/Medications Dosing Guide"  in media tab for additional information. Livingston Diones  Adams participated in the discussions, expressed understanding, and voiced agreement with the above plans.  All questions were answered to his satisfaction. he is encouraged to contact clinic should  he have any questions or concerns prior to his return visit.   Follow up plan: Return in about 4 months (around 05/09/2019) for Meter, and Logs.  Glade Lloyd, MD Phone: [phone]  Fax: [phone]  -  This note was partially dictated with voice recognition software. Similar sounding words can be transcribed inadequately or may not  be corrected upon review.  01/08/2019, 9:28 AM

## 2019-01-16 ENCOUNTER — Other Ambulatory Visit: Payer: Self-pay

## 2019-01-16 MED ORDER — INSULIN PEN NEEDLE 31G X 5 MM MISC: 5 refills | Status: DC

## 2019-01-16 MED ORDER — GLUCOSE BLOOD VI STRP: 1.0000 | ORAL_STRIP | Freq: Two times a day (BID) | 5 refills | Status: DC

## 2019-01-19 DIAGNOSIS — M1991 Primary osteoarthritis, unspecified site: Secondary | ICD-10-CM | POA: Diagnosis not present

## 2019-01-19 DIAGNOSIS — I10 Essential (primary) hypertension: Secondary | ICD-10-CM | POA: Diagnosis not present

## 2019-01-19 DIAGNOSIS — E7849 Other hyperlipidemia: Secondary | ICD-10-CM | POA: Diagnosis not present

## 2019-01-19 DIAGNOSIS — Z0001 Encounter for general adult medical examination with abnormal findings: Secondary | ICD-10-CM | POA: Diagnosis not present

## 2019-01-19 DIAGNOSIS — Z1389 Encounter for screening for other disorder: Secondary | ICD-10-CM | POA: Diagnosis not present

## 2019-01-19 DIAGNOSIS — G894 Chronic pain syndrome: Secondary | ICD-10-CM | POA: Diagnosis not present

## 2019-01-19 DIAGNOSIS — E119 Type 2 diabetes mellitus without complications: Secondary | ICD-10-CM | POA: Diagnosis not present

## 2019-01-19 DIAGNOSIS — J45909 Unspecified asthma, uncomplicated: Secondary | ICD-10-CM | POA: Diagnosis not present

## 2019-02-09 DIAGNOSIS — E119 Type 2 diabetes mellitus without complications: Secondary | ICD-10-CM | POA: Diagnosis not present

## 2019-02-09 DIAGNOSIS — I10 Essential (primary) hypertension: Secondary | ICD-10-CM | POA: Diagnosis not present

## 2019-02-09 DIAGNOSIS — M1991 Primary osteoarthritis, unspecified site: Secondary | ICD-10-CM | POA: Diagnosis not present

## 2019-02-09 DIAGNOSIS — M25462 Effusion, left knee: Secondary | ICD-10-CM | POA: Diagnosis not present

## 2019-02-12 DIAGNOSIS — M1712 Unilateral primary osteoarthritis, left knee: Secondary | ICD-10-CM | POA: Diagnosis not present

## 2019-03-17 IMAGING — DX DG CHEST 2V
2 series · 2 of 2 positions shown · non-contrast
Comparison: 01/05/2006

CLINICAL DATA: Left chest pain

EXAM:
CHEST  2 VIEW

[chest pa]
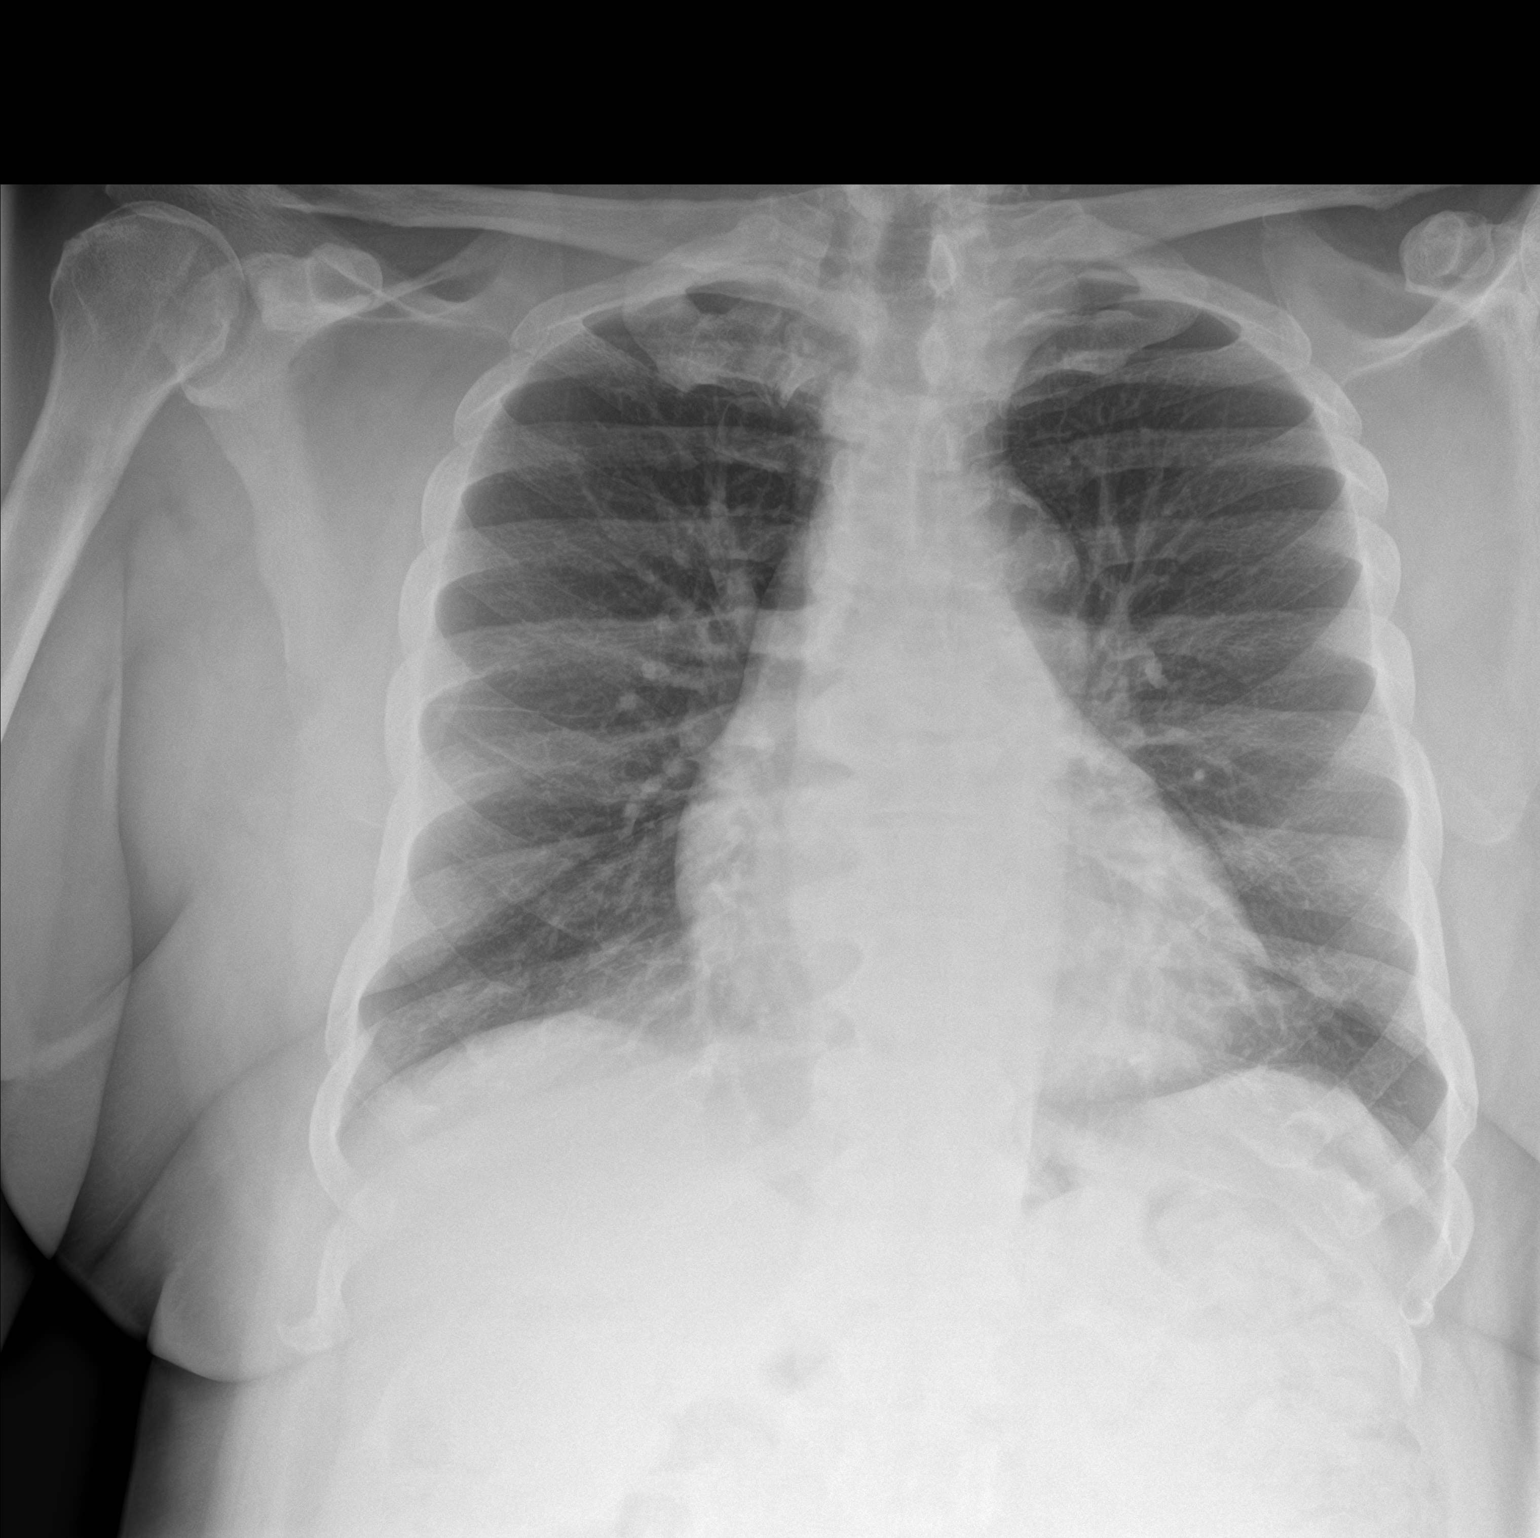

[chest lat]
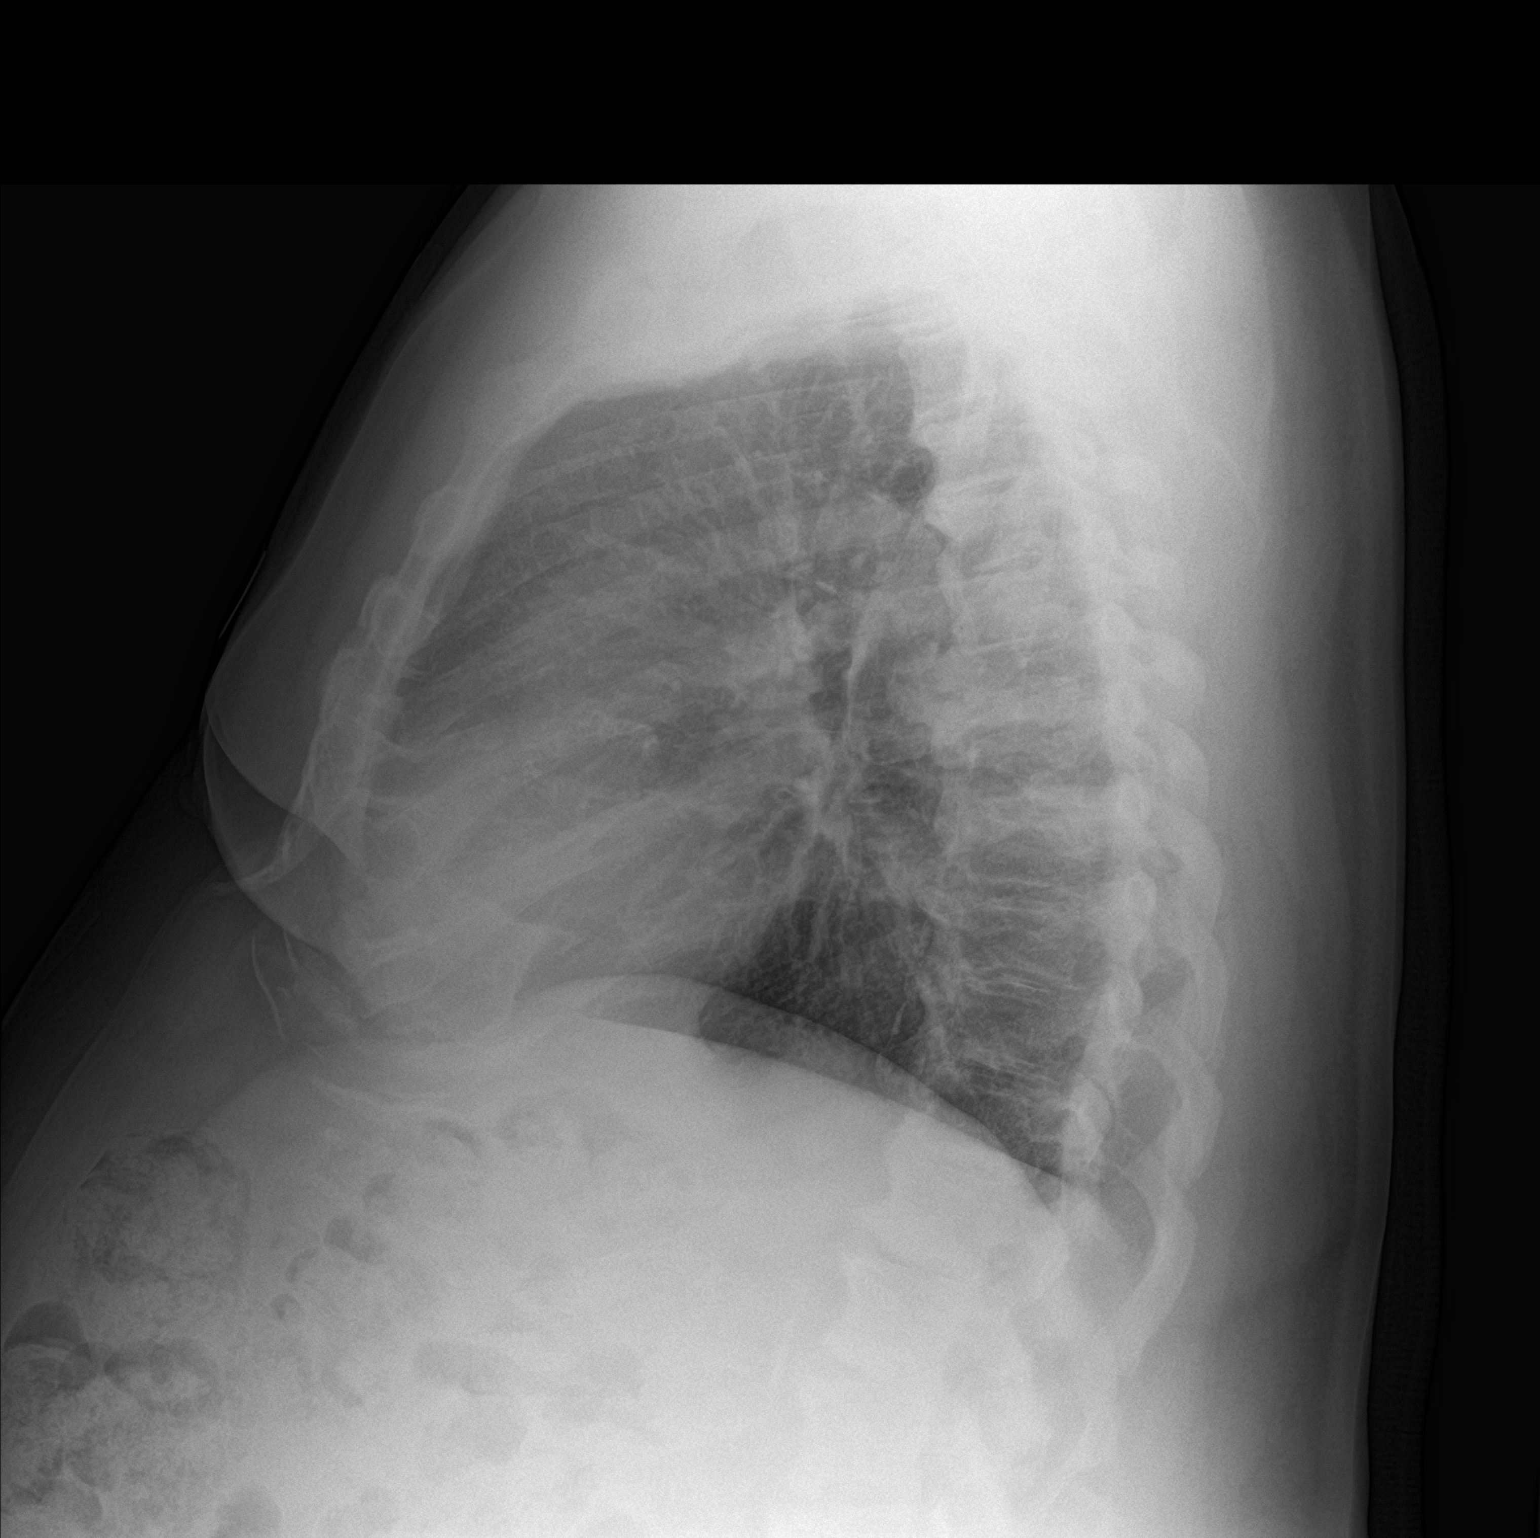

[2 of 2 positions shown; findings below may reference images not displayed]

FINDINGS: Heart and mediastinal contours are within normal limits. No focal
opacities or effusions. No acute bony abnormality. Degenerative
spurring throughout the thoracic spine.
IMPRESSION: No active cardiopulmonary disease.

## 2019-03-22 DIAGNOSIS — G894 Chronic pain syndrome: Secondary | ICD-10-CM | POA: Diagnosis not present

## 2019-04-19 DIAGNOSIS — G894 Chronic pain syndrome: Secondary | ICD-10-CM | POA: Diagnosis not present

## 2019-04-25 DIAGNOSIS — B351 Tinea unguium: Secondary | ICD-10-CM | POA: Diagnosis not present

## 2019-04-25 DIAGNOSIS — I10 Essential (primary) hypertension: Secondary | ICD-10-CM | POA: Diagnosis not present

## 2019-04-25 DIAGNOSIS — B353 Tinea pedis: Secondary | ICD-10-CM | POA: Diagnosis not present

## 2019-04-25 DIAGNOSIS — R201 Hypoesthesia of skin: Secondary | ICD-10-CM | POA: Diagnosis not present

## 2019-04-25 DIAGNOSIS — E119 Type 2 diabetes mellitus without complications: Secondary | ICD-10-CM | POA: Diagnosis not present

## 2019-04-25 DIAGNOSIS — E1140 Type 2 diabetes mellitus with diabetic neuropathy, unspecified: Secondary | ICD-10-CM | POA: Diagnosis not present

## 2019-05-01 ENCOUNTER — Other Ambulatory Visit: Payer: Self-pay | Admitting: "Endocrinology

## 2019-05-01 DIAGNOSIS — Z794 Long term (current) use of insulin: Secondary | ICD-10-CM | POA: Diagnosis not present

## 2019-05-01 DIAGNOSIS — E1165 Type 2 diabetes mellitus with hyperglycemia: Secondary | ICD-10-CM | POA: Diagnosis not present

## 2019-05-01 DIAGNOSIS — E119 Type 2 diabetes mellitus without complications: Secondary | ICD-10-CM | POA: Diagnosis not present

## 2019-05-02 LAB — COMPREHENSIVE METABOLIC PANEL
ALT: 7 IU/L (ref 0–44)
AST: 18 IU/L (ref 0–40)
Albumin/Globulin Ratio: 2.1 (ref 1.2–2.2)
Albumin: 4.6 g/dL (ref 3.8–4.8)
Alkaline Phosphatase: 60 IU/L (ref 39–117)
BUN/Creatinine Ratio: 18 (ref 10–24)
BUN: 17 mg/dL (ref 8–27)
Bilirubin Total: 0.2 mg/dL (ref 0.0–1.2)
CO2: 25 mmol/L (ref 20–29)
Calcium: 9.4 mg/dL (ref 8.6–10.2)
Chloride: 102 mmol/L (ref 96–106)
Creatinine, Ser: 0.97 mg/dL (ref 0.76–1.27)
GFR calc Af Amer: 93 mL/min/{1.73_m2} (ref >59)
GFR calc non Af Amer: 80 mL/min/{1.73_m2} (ref >59)
Globulin, Total: 2.2 g/dL (ref 1.5–4.5)
Glucose: 82 mg/dL (ref 65–99)
Potassium: 4.8 mmol/L (ref 3.5–5.2)
Sodium: 140 mmol/L (ref 134–144)
Total Protein: 6.8 g/dL (ref 6.0–8.5)

## 2019-05-02 LAB — HGB A1C W/O EAG: Hgb A1c MFr Bld: 7.3 % — ABNORMAL HIGH (ref 4.8–5.6)

## 2019-05-08 ENCOUNTER — Telehealth: Payer: Self-pay

## 2019-05-08 DIAGNOSIS — IMO0001 Reserved for inherently not codable concepts without codable children: Secondary | ICD-10-CM

## 2019-05-08 MED ORDER — INSULIN PEN NEEDLE 31G X 5 MM MISC: 5 refills | Status: DC

## 2019-05-08 MED ORDER — INSULIN ISOPHANE & REGULAR (HUMAN 70-30)100 UNIT/ML KWIKPEN: 18.0000 [IU] | PEN_INJECTOR | Freq: Two times a day (BID) | SUBCUTANEOUS | 3 refills | Status: DC

## 2019-05-08 NOTE — Telephone Encounter (Signed)
LeighAnn Brodi Nery, CMA  

## 2019-05-09 ENCOUNTER — Other Ambulatory Visit: Payer: Self-pay

## 2019-05-09 ENCOUNTER — Encounter: Payer: Self-pay | Admitting: "Endocrinology

## 2019-05-09 ENCOUNTER — Ambulatory Visit (INDEPENDENT_AMBULATORY_CARE_PROVIDER_SITE_OTHER): Payer: Medicare Other | Admitting: "Endocrinology

## 2019-05-09 DIAGNOSIS — IMO0001 Reserved for inherently not codable concepts without codable children: Secondary | ICD-10-CM

## 2019-05-09 DIAGNOSIS — E1165 Type 2 diabetes mellitus with hyperglycemia: Secondary | ICD-10-CM

## 2019-05-09 DIAGNOSIS — Z794 Long term (current) use of insulin: Secondary | ICD-10-CM

## 2019-05-09 DIAGNOSIS — I10 Essential (primary) hypertension: Secondary | ICD-10-CM

## 2019-05-09 DIAGNOSIS — E782 Mixed hyperlipidemia: Secondary | ICD-10-CM | POA: Diagnosis not present

## 2019-05-09 MED ORDER — INSULIN ISOPHANE & REGULAR (HUMAN 70-30)100 UNIT/ML KWIKPEN: 18.0000 [IU] | PEN_INJECTOR | Freq: Two times a day (BID) | SUBCUTANEOUS | 2 refills | Status: DC

## 2019-05-09 NOTE — Progress Notes (Signed)
05/09/2019                                                    Endocrinology Telehealth Visit Follow up Note -During COVID -19 Pandemic  This visit type was conducted due to national recommendations for restrictions regarding the COVID-19 Pandemic  in an effort to limit this patient's exposure and mitigate transmission of the corona virus.  Due to his co-morbid illnesses, Patrick Taylor is at  moderate to high risk for complications without adequate follow up.  This format is felt to be most appropriate for him at this time.  I connected with this patient on 05/09/2019   by telephone and verified that I am speaking with the correct person using two identifiers. Patrick Taylor, 04-16-1951. he has verbally consented to this visit. All issues noted in this document were discussed and addressed. The format was not optimal for physical exam.   Subjective:    Patient ID: Patrick Taylor, male    DOB: 07-28-1951,    Past Medical History:  Diagnosis Date  . Anemia   . Asthma   . Carpal tunnel syndrome   . Chronic back pain   . Colon polyps   . DM (diabetes mellitus) ([place])   . HTN (hypertension)   . Hyperlipidemia    Past Surgical History:  Procedure Laterality Date  . ABDOMINAL HERNIA REPAIR    . COLONOSCOPY  01/24/09   rectal polyp/adenomatous poylps/tubulovillous  . COLONOSCOPY  03/02/2012   Procedure: COLONOSCOPY;  Surgeon: Daneil Dolin, MD;  Location: [facility];  Service: Endoscopy;  Laterality: N/A;  11:00   Social History   Socioeconomic History  . Marital status: Married    Spouse name: Not on file  . Number of children: Not on file  . Years of education: Not on file  . Highest education level: Not on file  Occupational History  . Occupation: works at [employer]  . Financial resource strain: Not on file  . Food insecurity:    Worry: Not on file    Inability: Not on file  . Transportation needs:    Medical: Not on file    Non-medical: Not on file   Tobacco Use  . Smoking status: Former Smoker    Last attempt to quit: 02/18/1999    Years since quitting: 20.2  . Smokeless tobacco: Never Used  Substance and Sexual Activity  . Alcohol use: No    Comment: no heavy use in past  . Drug use: No  . Sexual activity: Not on file  Lifestyle  . Physical activity:    Days per week: Not on file    Minutes per session: Not on file  . Stress: Not on file  Relationships  . Social connections:    Talks on phone: Not on file    Gets together: Not on file    Attends religious service: Not on file    Active member of club or organization: Not on file    Attends meetings of clubs or organizations: Not on file    Relationship status: Not on file  Other Topics Concern  . Not on file  Social History Narrative  . Not on file   Outpatient Encounter Medications as of 05/09/2019  Medication Sig  . aspirin 81 MG tablet Take 81 mg  by mouth daily.  . Blood Glucose Monitoring Suppl (ACCU-CHEK AVIVA PLUS) w/Device KIT Use as directed bid E11.65  . budesonide-formoterol (SYMBICORT) 80-4.5 MCG/ACT inhaler Inhale 2 puffs into the lungs 2 (two) times daily as needed (shortness of breath).   . Cholecalciferol (VITAMIN D3) 5000 units CAPS Take 1 capsule (5,000 Units total) by mouth daily.  . enalapril (VASOTEC) 20 MG tablet Take 1 tablet (20 mg total) by mouth daily.  . furosemide (LASIX) 20 MG tablet Take 20 mg by mouth daily.   [name] Kitchen gabapentin (NEURONTIN) 300 MG capsule Take 300 mg by mouth 3 (three) times daily.   [name] Kitchen glucose blood (ACCU-CHEK AVIVA PLUS) test strip 1 each by Other route 2 (two) times daily. Use as instructed 2 x daily. E11.65  . HYDROcodone-acetaminophen (NORCO/VICODIN) 5-325 MG tablet Take 1 tablet by mouth 2 (two) times daily as needed for moderate pain.  . Insulin Pen Needle (B-D UF III MINI PEN NEEDLES) 31G X 5 MM MISC USE FOR INSULIN INJECTIONS 2 TIMES A DAY  . metFORMIN (GLUCOPHAGE) 1000 MG tablet TAKE 1 TABLET BY MOUTH 2 TIMES DAILY WITH A  MEAL.  . pravastatin (PRAVACHOL) 20 MG tablet Take 20 mg by mouth daily.  . [DISCONTINUED] Insulin Isophane & Regular Human (NOVOLIN 70/30 FLEXPEN) (70-30) 100 UNIT/ML PEN Inject 18 Units into the skin 2 (two) times daily before a meal.   No facility-administered encounter medications on file as of 05/09/2019.    ALLERGIES: No Known Allergies VACCINATION STATUS:  There is no immunization history on file for this patient.  Diabetes  He presents for his follow-up diabetic visit. He has type 2 diabetes mellitus. Onset time: He was diagnosed at approximate age of 67 years. His disease course has been stable. There are no hypoglycemic associated symptoms. Pertinent negatives for hypoglycemia include no confusion, headaches, pallor or seizures. Pertinent negatives for diabetes include no chest pain, no fatigue, no polydipsia, no polyphagia, no polyuria and no weakness. There are no hypoglycemic complications. Symptoms are improving. Risk factors for coronary artery disease include dyslipidemia, diabetes mellitus, hypertension and sedentary lifestyle. Current diabetic treatment includes oral agent (monotherapy). He is compliant with treatment most of the time. His weight is stable. He is following a generally unhealthy diet. He rarely participates in exercise. His home blood glucose trend is fluctuating minimally. His breakfast blood glucose range is generally 130-140 mg/dl. His lunch blood glucose range is generally 130-140 mg/dl. His dinner blood glucose range is generally 130-140 mg/dl. His bedtime blood glucose range is generally 130-140 mg/dl. His overall blood glucose range is 130-140 mg/dl. An ACE inhibitor/angiotensin II receptor blocker is being taken. Eye exam is current.  Hyperlipidemia  This is a chronic problem. The current episode started more than 1 year ago. Exacerbating diseases include diabetes and obesity. Pertinent negatives include no chest pain, myalgias or shortness of breath. Current  antihyperlipidemic treatment includes statins. Risk factors for coronary artery disease include diabetes mellitus, male sex, a sedentary lifestyle and obesity.  Hypertension  This is a chronic problem. The current episode started more than 1 year ago. Pertinent negatives include no chest pain, headaches, neck pain, palpitations or shortness of breath. Risk factors for coronary artery disease include dyslipidemia, diabetes mellitus, obesity, male gender, sedentary lifestyle and smoking/tobacco exposure. Past treatments include ACE inhibitors.    Review of Systems  Constitutional: Negative for fatigue and unexpected weight change.  HENT: Negative for dental problem, mouth sores and trouble swallowing.   Eyes: Negative for visual disturbance.  Respiratory:  Negative for cough, choking, chest tightness, shortness of breath and wheezing.   Cardiovascular: Negative for chest pain, palpitations and leg swelling.  Gastrointestinal: Negative for abdominal distention, abdominal pain, constipation, diarrhea, nausea and vomiting.  Endocrine: Negative for polydipsia, polyphagia and polyuria.  Genitourinary: Negative for dysuria, flank pain, hematuria and urgency.  Musculoskeletal: Negative for back pain, gait problem, myalgias and neck pain.  Skin: Negative for pallor, rash and wound.  Neurological: Negative for seizures, syncope, weakness, numbness and headaches.  Psychiatric/Behavioral: Negative for confusion and dysphoric mood.    Objective:    There were no vitals taken for this visit.  Wt Readings from Last 3 Encounters:  01/08/19 207 lb (93.9 kg)  09/06/18 206 lb (93.4 kg)  05/03/18 213 lb (96.6 kg)      Results for orders placed or performed in visit on 05/01/19  Comprehensive metabolic panel  Result Value Ref Range   Glucose 82 65 - 99 mg/dL   BUN 17 8 - 27 mg/dL   Creatinine, Ser 0.97 0.76 - 1.27 mg/dL   GFR calc non Af Amer 80 >59 mL/min/1.73   GFR calc Af Amer 93 >59 mL/min/1.73    BUN/Creatinine Ratio 18 10 - 24   Sodium 140 134 - 144 mmol/L   Potassium 4.8 3.5 - 5.2 mmol/L   Chloride 102 96 - 106 mmol/L   CO2 25 20 - 29 mmol/L   Calcium 9.4 8.6 - 10.2 mg/dL   Total Protein 6.8 6.0 - 8.5 g/dL   Albumin 4.6 3.8 - 4.8 g/dL   Globulin, Total 2.2 1.5 - 4.5 g/dL   Albumin/Globulin Ratio 2.1 1.2 - 2.2   Bilirubin Total 0.2 0.0 - 1.2 mg/dL   Alkaline Phosphatase 60 39 - 117 IU/L   AST 18 0 - 40 IU/L   ALT 7 0 - 44 IU/L  Hgb A1c w/o eAG  Result Value Ref Range   Hgb A1c MFr Bld 7.3 (H) 4.8 - 5.6 %  Diabetic Labs (most recent): Lab Results  Component Value Date   HGBA1C 7.3 (H) 05/01/2019   HGBA1C 6.6 (H) 01/02/2019   HGBA1C 7.0 (H) 09/01/2018    Lipid Panel     Component Value Date/Time   CHOL 122 09/01/2018 0855   TRIG 83 09/01/2018 0855   HDL 41 09/01/2018 0855   CHOLHDL 4.0 12/01/2016 0748   LDLCALC 64 09/01/2018 0855    Assessment & Plan:   1. Uncontrolled type 2 diabetes mellitus without complication, with long-term current use of insulin ([place])  His previsit logs are near target, A1c of 7.3%, progressively improving from 10.1%.      Glucose logs and insulin administration records pertaining to this visit,  to be scanned into patient's records.  Recent labs reviewed. - Patient remains at a high risk for more acute and chronic complications of diabetes which include CAD, CVA, CKD, retinopathy, and neuropathy. These are all discussed in detail with the patient.  - I have re-counseled the patient on diet management and weight loss  by adopting a carbohydrate restricted / protein rich  Diet. - Patient is advised to stick to a routine mealtimes to eat 3 meals  a day and avoid unnecessary snacks ( to snack only to correct hypoglycemia).  - Patient admits there is a room for improvement in his diet and drink choices. -  Suggestion is made for him to avoid simple carbohydrates  from his diet including Cakes, Sweet Desserts / Pastries, Ice Cream, Soda  (diet and regular),  Sweet Tea, Candies, Chips, Cookies, Store Bought Juices, Alcohol in Excess of  1-2 drinks a day, Artificial Sweeteners, and "Sugar-free" Products. This will help patient to have stable blood glucose profile and potentially avoid unintended weight gain.   - I have approached patient with the following individualized plan to manage diabetes and patient agrees.  -He has responded reasonably well to Novolin 70/30.  He is advised to continue 18 units with breakfast and 18 units with supper for pre-meal blood glucose above 90 mg/dL.   -He will continue  strict monitoring of glucose at least 2 times a day-daily before breakfast and before supper, and at any other time as needed.     -Patient is warned not to take insulin without proper monitoring per orders.  -Patient is encouraged to call clinic for blood glucose levels less than 70 or above 300 mg /dl.  -He is tolerating metformin, he is advised to continue metformin 1000 mg p.o. twice daily -after breakfast and supper.   -He did not afford the co-pays for Victoza, discontinued during his last visit.  - Patient specific target  for A1c; LDL, HDL, Triglycerides, and  Waist Circumference were discussed in detail.  2) BP/HTN: he is advised to home monitor blood pressure and report if > 140/90 on 2 separate readings.   He is advised to continue current medications  including ACEI. I advised him to be consistent in taking his blood pressure medications in the morning.  3) Lipids/HPL: Recent lipid panel showed controlled LDL at 64.  He is advised to continue pravastatin 20 mg p.o. nightly.     4)  Weight/Diet: CDE consult in progress, exercise, and carbohydrates information provided.  5) Chronic Care/Health Maintenance:  -Patient is on ACEI and Statin medications and encouraged to continue to follow up with Ophthalmology, Podiatrist at least yearly or according to recommendations, and advised to stay away from smoking. I have  recommended yearly flu vaccine and pneumonia vaccination at least every 5 years; moderate intensity exercise for up to 150 minutes weekly; and  sleep for at least 7 hours a day.  4. Vitamin D deficiency -He is currently on cholecalciferol 5000 units daily, status post therapy with vitamin D 50,000 units weekly for 12 weeks.    I advised patient to maintain close follow up with his PCP for primary care needs.  - Patient Care Time Today:  25 min, of which >50% was spent in reviewing his  current and  previous labs/studies, his blood glucose readings, previous treatments, and medications doses and developing a plan for long-term care based on the latest recommendations for standards of care.  Patrick Taylor participated in the discussions, expressed understanding, and voiced agreement with the above plans.  All questions were answered to his satisfaction. he is encouraged to contact clinic should he have any questions or concerns prior to his return visit.   Follow up plan: Return in about 4 months (around 09/09/2019) for Follow up with Pre-visit Labs, Meter, and Logs.  Glade Lloyd, MD Phone: [phone]  Fax: [phone]  -  This note was partially dictated with voice recognition software. Similar sounding words can be transcribed inadequately or may not  be corrected upon review.  05/09/2019, 11:32 AM

## 2019-05-17 DIAGNOSIS — M792 Neuralgia and neuritis, unspecified: Secondary | ICD-10-CM | POA: Diagnosis not present

## 2019-05-17 DIAGNOSIS — R52 Pain, unspecified: Secondary | ICD-10-CM | POA: Diagnosis not present

## 2019-05-17 DIAGNOSIS — G894 Chronic pain syndrome: Secondary | ICD-10-CM | POA: Diagnosis not present

## 2019-05-17 DIAGNOSIS — M1712 Unilateral primary osteoarthritis, left knee: Secondary | ICD-10-CM | POA: Diagnosis not present

## 2019-06-14 DIAGNOSIS — G894 Chronic pain syndrome: Secondary | ICD-10-CM | POA: Diagnosis not present

## 2019-06-14 DIAGNOSIS — L819 Disorder of pigmentation, unspecified: Secondary | ICD-10-CM | POA: Diagnosis not present

## 2019-06-14 DIAGNOSIS — Z1389 Encounter for screening for other disorder: Secondary | ICD-10-CM | POA: Diagnosis not present

## 2019-07-16 DIAGNOSIS — G894 Chronic pain syndrome: Secondary | ICD-10-CM | POA: Diagnosis not present

## 2019-07-18 ENCOUNTER — Telehealth: Payer: Self-pay | Admitting: "Endocrinology

## 2019-07-18 MED ORDER — ACCU-CHEK AVIVA PLUS VI STRP: 1.0000 | ORAL_STRIP | Freq: Three times a day (TID) | 5 refills | Status: DC

## 2019-07-18 NOTE — Telephone Encounter (Signed)
Patient called to see if he needs to be testing his sugar 2 times a day or 3 times a day. The pharmacy told him 2 times a day but he thought Dr Dorris Fetch told him 3 times

## 2019-07-18 NOTE — Telephone Encounter (Signed)
Rx sent 

## 2019-07-27 DIAGNOSIS — B351 Tinea unguium: Secondary | ICD-10-CM | POA: Diagnosis not present

## 2019-07-27 DIAGNOSIS — I10 Essential (primary) hypertension: Secondary | ICD-10-CM | POA: Diagnosis not present

## 2019-07-27 DIAGNOSIS — E1140 Type 2 diabetes mellitus with diabetic neuropathy, unspecified: Secondary | ICD-10-CM | POA: Diagnosis not present

## 2019-08-13 DIAGNOSIS — G894 Chronic pain syndrome: Secondary | ICD-10-CM | POA: Diagnosis not present

## 2019-09-05 ENCOUNTER — Other Ambulatory Visit: Payer: Self-pay | Admitting: "Endocrinology

## 2019-09-05 DIAGNOSIS — E1165 Type 2 diabetes mellitus with hyperglycemia: Secondary | ICD-10-CM | POA: Diagnosis not present

## 2019-09-05 DIAGNOSIS — E1140 Type 2 diabetes mellitus with diabetic neuropathy, unspecified: Secondary | ICD-10-CM | POA: Diagnosis not present

## 2019-09-05 DIAGNOSIS — Z794 Long term (current) use of insulin: Secondary | ICD-10-CM | POA: Diagnosis not present

## 2019-09-06 LAB — COMPREHENSIVE METABOLIC PANEL
ALT: 9 IU/L (ref 0–44)
AST: 21 IU/L (ref 0–40)
Albumin/Globulin Ratio: 1.8 (ref 1.2–2.2)
Albumin: 4.6 g/dL (ref 3.8–4.8)
Alkaline Phosphatase: 67 IU/L (ref 39–117)
BUN/Creatinine Ratio: 18 (ref 10–24)
BUN: 17 mg/dL (ref 8–27)
Bilirubin Total: 0.2 mg/dL (ref 0.0–1.2)
CO2: 27 mmol/L (ref 20–29)
Calcium: 9.4 mg/dL (ref 8.6–10.2)
Chloride: 103 mmol/L (ref 96–106)
Creatinine, Ser: 0.92 mg/dL (ref 0.76–1.27)
GFR calc Af Amer: 99 mL/min/{1.73_m2} (ref >59)
GFR calc non Af Amer: 86 mL/min/{1.73_m2} (ref >59)
Globulin, Total: 2.5 g/dL (ref 1.5–4.5)
Glucose: 107 mg/dL — ABNORMAL HIGH (ref 65–99)
Potassium: 4.9 mmol/L (ref 3.5–5.2)
Sodium: 144 mmol/L (ref 134–144)
Total Protein: 7.1 g/dL (ref 6.0–8.5)

## 2019-09-06 LAB — HGB A1C W/O EAG: Hgb A1c MFr Bld: 7.3 % — ABNORMAL HIGH (ref 4.8–5.6)

## 2019-09-10 ENCOUNTER — Other Ambulatory Visit: Payer: Self-pay

## 2019-09-10 ENCOUNTER — Encounter: Payer: Self-pay | Admitting: "Endocrinology

## 2019-09-10 ENCOUNTER — Ambulatory Visit (INDEPENDENT_AMBULATORY_CARE_PROVIDER_SITE_OTHER): Payer: Medicare Other | Admitting: "Endocrinology

## 2019-09-10 DIAGNOSIS — E1165 Type 2 diabetes mellitus with hyperglycemia: Secondary | ICD-10-CM | POA: Diagnosis not present

## 2019-09-10 DIAGNOSIS — Z794 Long term (current) use of insulin: Secondary | ICD-10-CM | POA: Diagnosis not present

## 2019-09-10 DIAGNOSIS — E782 Mixed hyperlipidemia: Secondary | ICD-10-CM

## 2019-09-10 DIAGNOSIS — I10 Essential (primary) hypertension: Secondary | ICD-10-CM

## 2019-09-10 DIAGNOSIS — IMO0001 Reserved for inherently not codable concepts without codable children: Secondary | ICD-10-CM

## 2019-09-10 NOTE — Progress Notes (Signed)
09/10/2019                                                    Endocrinology Telehealth Visit Follow up Note -During COVID -19 Pandemic  This visit type was conducted due to national recommendations for restrictions regarding the COVID-19 Pandemic  in an effort to limit this patient's exposure and mitigate transmission of the corona virus.  Due to his co-morbid illnesses, Patrick Taylor is at  moderate to high risk for complications without adequate follow up.  This format is felt to be most appropriate for him at this time.  I connected with this patient on 09/10/2019   by telephone and verified that I am speaking with the correct person using two identifiers. Patrick Taylor, 26-Feb-1951. he has verbally consented to this visit. All issues noted in this document were discussed and addressed. The format was not optimal for physical exam.   Subjective:    Patient ID: Patrick Taylor, male    DOB: May 11, 1951,    Past Medical History:  Diagnosis Date  . Anemia   . Asthma   . Carpal tunnel syndrome   . Chronic back pain   . Colon polyps   . DM (diabetes mellitus) ([place])   . HTN (hypertension)   . Hyperlipidemia    Past Surgical History:  Procedure Laterality Date  . ABDOMINAL HERNIA REPAIR    . COLONOSCOPY  01/24/09   rectal polyp/adenomatous poylps/tubulovillous  . COLONOSCOPY  03/02/2012   Procedure: COLONOSCOPY;  Surgeon: Daneil Dolin, MD;  Location: [facility];  Service: Endoscopy;  Laterality: N/A;  11:00   Social History   Socioeconomic History  . Marital status: Married    Spouse name: Not on file  . Number of children: Not on file  . Years of education: Not on file  . Highest education level: Not on file  Occupational History  . Occupation: works at [employer]  . Financial resource strain: Not on file  . Food insecurity    Worry: Not on file    Inability: Not on file  . Transportation needs    Medical: Not on file    Non-medical: Not on file   Tobacco Use  . Smoking status: Former Smoker    Quit date: 02/18/1999    Years since quitting: 20.5  . Smokeless tobacco: Never Used  Substance and Sexual Activity  . Alcohol use: No    Comment: no heavy use in past  . Drug use: No  . Sexual activity: Not on file  Lifestyle  . Physical activity    Days per week: Not on file    Minutes per session: Not on file  . Stress: Not on file  Relationships  . Social Herbalist on phone: Not on file    Gets together: Not on file    Attends religious service: Not on file    Active member of club or organization: Not on file    Attends meetings of clubs or organizations: Not on file    Relationship status: Not on file  Other Topics Concern  . Not on file  Social History Narrative  . Not on file   Outpatient Encounter Medications as of 09/10/2019  Medication Sig  . aspirin 81 MG tablet Take 81 mg by mouth  daily.  . Blood Glucose Monitoring Suppl (ACCU-CHEK AVIVA PLUS) w/Device KIT Use as directed bid E11.65  . budesonide-formoterol (SYMBICORT) 80-4.5 MCG/ACT inhaler Inhale 2 puffs into the lungs 2 (two) times daily as needed (shortness of breath).   . Cholecalciferol (VITAMIN D3) 5000 units CAPS Take 1 capsule (5,000 Units total) by mouth daily.  . enalapril (VASOTEC) 20 MG tablet Take 1 tablet (20 mg total) by mouth daily.  . furosemide (LASIX) 20 MG tablet Take 20 mg by mouth daily.   [name] Kitchen gabapentin (NEURONTIN) 300 MG capsule Take 300 mg by mouth 3 (three) times daily.   [name] Kitchen glucose blood (ACCU-CHEK AVIVA PLUS) test strip 1 each by Other route 3 (three) times daily. Use as instructed 2 x daily. E11.65  . HYDROcodone-acetaminophen (NORCO/VICODIN) 5-325 MG tablet Take 1 tablet by mouth 2 (two) times daily as needed for moderate pain.  . Insulin Isophane & Regular Human (HUMULIN 70/30 MIX) (70-30) 100 UNIT/ML PEN Inject 18 Units into the skin 2 (two) times a day.  . Insulin Pen Needle (B-D UF III MINI PEN NEEDLES) 31G X 5 MM MISC USE  FOR INSULIN INJECTIONS 2 TIMES A DAY  . metFORMIN (GLUCOPHAGE) 1000 MG tablet TAKE 1 TABLET BY MOUTH 2 TIMES DAILY WITH A MEAL.  . pravastatin (PRAVACHOL) 20 MG tablet Take 20 mg by mouth daily.   No facility-administered encounter medications on file as of 09/10/2019.    ALLERGIES: No Known Allergies VACCINATION STATUS:  There is no immunization history on file for this patient.  Diabetes He presents for his follow-up diabetic visit. He has type 2 diabetes mellitus. Onset time: He was diagnosed at approximate age of 53 years. His disease course has been stable. There are no hypoglycemic associated symptoms. Pertinent negatives for hypoglycemia include no confusion, headaches, pallor or seizures. Pertinent negatives for diabetes include no chest pain, no fatigue, no polydipsia, no polyphagia, no polyuria and no weakness. There are no hypoglycemic complications. Symptoms are stable. Risk factors for coronary artery disease include dyslipidemia, diabetes mellitus, hypertension and sedentary lifestyle. Current diabetic treatment includes oral agent (monotherapy). He is compliant with treatment most of the time. His weight is decreasing steadily. He is following a generally unhealthy diet. He rarely participates in exercise. His home blood glucose trend is fluctuating minimally. His breakfast blood glucose range is generally 130-140 mg/dl. His lunch blood glucose range is generally 130-140 mg/dl. His dinner blood glucose range is generally 130-140 mg/dl. His bedtime blood glucose range is generally 130-140 mg/dl. His overall blood glucose range is 130-140 mg/dl. An ACE inhibitor/angiotensin II receptor blocker is being taken. Eye exam is current.  Hyperlipidemia This is a chronic problem. The current episode started more than 1 year ago. Exacerbating diseases include diabetes and obesity. Pertinent negatives include no chest pain, myalgias or shortness of breath. Current antihyperlipidemic treatment  includes statins. Risk factors for coronary artery disease include diabetes mellitus, male sex, a sedentary lifestyle and obesity.  Hypertension This is a chronic problem. The current episode started more than 1 year ago. Pertinent negatives include no chest pain, headaches, neck pain, palpitations or shortness of breath. Risk factors for coronary artery disease include dyslipidemia, diabetes mellitus, obesity, male gender, sedentary lifestyle and smoking/tobacco exposure. Past treatments include ACE inhibitors.    Review of systems: Limited as above.  Objective:    There were no vitals taken for this visit.  Wt Readings from Last 3 Encounters:  01/08/19 207 lb (93.9 kg)  09/06/18 206 lb (93.4 kg)  05/03/18 213 lb (96.6 kg)      Results for orders placed or performed in visit on 09/05/19  Comprehensive metabolic panel  Result Value Ref Range   Glucose 107 (H) 65 - 99 mg/dL   BUN 17 8 - 27 mg/dL   Creatinine, Ser 0.92 0.76 - 1.27 mg/dL   GFR calc non Af Amer 86 >59 mL/min/1.73   GFR calc Af Amer 99 >59 mL/min/1.73   BUN/Creatinine Ratio 18 10 - 24   Sodium 144 134 - 144 mmol/L   Potassium 4.9 3.5 - 5.2 mmol/L   Chloride 103 96 - 106 mmol/L   CO2 27 20 - 29 mmol/L   Calcium 9.4 8.6 - 10.2 mg/dL   Total Protein 7.1 6.0 - 8.5 g/dL   Albumin 4.6 3.8 - 4.8 g/dL   Globulin, Total 2.5 1.5 - 4.5 g/dL   Albumin/Globulin Ratio 1.8 1.2 - 2.2   Bilirubin Total 0.2 0.0 - 1.2 mg/dL   Alkaline Phosphatase 67 39 - 117 IU/L   AST 21 0 - 40 IU/L   ALT 9 0 - 44 IU/L  Hgb A1c w/o eAG  Result Value Ref Range   Hgb A1c MFr Bld 7.3 (H) 4.8 - 5.6 %  Diabetic Labs (most recent): Lab Results  Component Value Date   HGBA1C 7.3 (H) 09/05/2019   HGBA1C 7.3 (H) 05/01/2019   HGBA1C 6.6 (H) 01/02/2019    Lipid Panel     Component Value Date/Time   CHOL 122 09/01/2018 0855   TRIG 83 09/01/2018 0855   HDL 41 09/01/2018 0855   CHOLHDL 4.0 12/01/2016 0748   LDLCALC 64 09/01/2018 0855     Assessment & Plan:   1. Uncontrolled type 2 diabetes mellitus without complication, with long-term current use of insulin ([place])  His previsit labs show A1c stable at 7.3% and his logs are consistent with near target profile was fasting and postprandial.  His A1c is progressively improving from 10.1%.     Glucose logs and insulin administration records pertaining to this visit,  to be scanned into patient's records.  Recent labs reviewed. - Patient remains at a high risk for more acute and chronic complications of diabetes which include CAD, CVA, CKD, retinopathy, and neuropathy. These are all discussed in detail with the patient.  - I have re-counseled the patient on diet management and weight loss  by adopting a carbohydrate restricted / protein rich  Diet. - Patient is advised to stick to a routine mealtimes to eat 3 meals  a day and avoid unnecessary snacks ( to snack only to correct hypoglycemia).  - he  admits there is a room for improvement in his diet and drink choices. -  Suggestion is made for him to avoid simple carbohydrates  from his diet including Cakes, Sweet Desserts / Pastries, Ice Cream, Soda (diet and regular), Sweet Tea, Candies, Chips, Cookies, Sweet Pastries,  Store Bought Juices, Alcohol in Excess of  1-2 drinks a day, Artificial Sweeteners, Coffee Creamer, and "Sugar-free" Products. This will help patient to have stable blood glucose profile and potentially avoid unintended weight gain.  - I have approached patient with the following individualized plan to manage diabetes and patient agrees.  -He has responded very well to premixed insulin using Novolin 70/30.  - He is advised to continue 18 units with breakfast and 18 units with supper for pre-meal blood glucose above 90 mg/dL.   -He will continue  strict monitoring of glucose at least 2 times a  day-daily before breakfast and before supper, and at any other time as needed.     -Patient is warned not to take insulin  without proper monitoring per orders.  -Patient is encouraged to call clinic for blood glucose levels less than 70 or above 300 mg /dl.  -He is advised to continue metformin 1000 mg p.o. twice daily -after breakfast and supper.   -He was not able to obtain Victoza coverage, advised to continue Victoza 1.2 mg subcutaneously daily.     - Patient specific target  for A1c; LDL, HDL, Triglycerides, and  Waist Circumference were discussed in detail.  2) BP/HTN: he is advised to home monitor blood pressure and report if > 140/90 on 2 separate readings.   He is advised to continue current medications  including ACEI. I advised him to be consistent in taking his blood pressure medications in the morning.  3) Lipids/HPL: Recent lipid panel showed controlled LDL at 64.  He is advised to continue pravastatin 20 mg p.o. nightly.      4)  Weight/Diet: CDE consult in progress, exercise, and carbohydrates information provided.  5) Chronic Care/Health Maintenance:  -Patient is on ACEI and Statin medications and encouraged to continue to follow up with Ophthalmology, Podiatrist at least yearly or according to recommendations, and advised to stay away from smoking. I have recommended yearly flu vaccine and pneumonia vaccination at least every 5 years; moderate intensity exercise for up to 150 minutes weekly; and  sleep for at least 7 hours a day.  4. Vitamin D deficiency -He is currently on cholecalciferol 5000 units daily, status post therapy with vitamin D 50,000 units weekly for 12 weeks.    I advised patient to maintain close follow up with his PCP for primary care needs.  - Patient Care Time Today:  25 min, of which >50% was spent in  counseling and the rest reviewing his  current and  previous labs/studies, previous treatments, his blood glucose readings, and medications' doses and developing a plan for long-term care based on the latest recommendations for standards of care.   Patrick Taylor  participated in the discussions, expressed understanding, and voiced agreement with the above plans.  All questions were answered to his satisfaction. he is encouraged to contact clinic should he have any questions or concerns prior to his return visit.  Follow up plan: Return in about 4 months (around 01/10/2020) for Bring Meter and Logs- A1c in Office, Include 8 log sheets.  Glade Lloyd, MD Phone: [phone]  Fax: [phone]  -  This note was partially dictated with voice recognition software. Similar sounding words can be transcribed inadequately or may not  be corrected upon review.  09/10/2019, 9:43 AM

## 2019-09-12 DIAGNOSIS — G894 Chronic pain syndrome: Secondary | ICD-10-CM | POA: Diagnosis not present

## 2019-09-12 DIAGNOSIS — M1991 Primary osteoarthritis, unspecified site: Secondary | ICD-10-CM | POA: Diagnosis not present

## 2019-09-12 DIAGNOSIS — M792 Neuralgia and neuritis, unspecified: Secondary | ICD-10-CM | POA: Diagnosis not present

## 2019-10-04 DIAGNOSIS — G894 Chronic pain syndrome: Secondary | ICD-10-CM | POA: Diagnosis not present

## 2019-10-04 DIAGNOSIS — M792 Neuralgia and neuritis, unspecified: Secondary | ICD-10-CM | POA: Diagnosis not present

## 2019-10-04 DIAGNOSIS — Z23 Encounter for immunization: Secondary | ICD-10-CM | POA: Diagnosis not present

## 2019-10-04 DIAGNOSIS — M1991 Primary osteoarthritis, unspecified site: Secondary | ICD-10-CM | POA: Diagnosis not present

## 2019-10-23 ENCOUNTER — Telehealth: Payer: Self-pay | Admitting: *Deleted

## 2019-10-23 NOTE — Telephone Encounter (Signed)
I received a call from this phone number a male voice states he missed a call. I called and asked if he had a question and he states he got a call from this number concerning his wife.  I reviewed appt scheduling and pt's wife Everlean Alstrom is scheduled for 121/13/2020 at6 9:00am.

## 2019-11-26 DIAGNOSIS — G894 Chronic pain syndrome: Secondary | ICD-10-CM | POA: Diagnosis not present

## 2019-11-26 DIAGNOSIS — M1991 Primary osteoarthritis, unspecified site: Secondary | ICD-10-CM | POA: Diagnosis not present

## 2019-11-28 ENCOUNTER — Other Ambulatory Visit: Payer: Self-pay | Admitting: "Endocrinology

## 2019-12-13 DIAGNOSIS — E1140 Type 2 diabetes mellitus with diabetic neuropathy, unspecified: Secondary | ICD-10-CM | POA: Diagnosis not present

## 2019-12-13 DIAGNOSIS — I2510 Atherosclerotic heart disease of native coronary artery without angina pectoris: Secondary | ICD-10-CM | POA: Diagnosis not present

## 2019-12-13 DIAGNOSIS — G894 Chronic pain syndrome: Secondary | ICD-10-CM | POA: Diagnosis not present

## 2019-12-13 DIAGNOSIS — J449 Chronic obstructive pulmonary disease, unspecified: Secondary | ICD-10-CM | POA: Diagnosis not present

## 2020-01-10 ENCOUNTER — Ambulatory Visit: Payer: Medicare Other | Admitting: "Endocrinology

## 2020-01-10 ENCOUNTER — Ambulatory Visit (INDEPENDENT_AMBULATORY_CARE_PROVIDER_SITE_OTHER): Payer: Medicare Other | Admitting: "Endocrinology

## 2020-01-10 ENCOUNTER — Encounter: Payer: Self-pay | Admitting: "Endocrinology

## 2020-01-10 ENCOUNTER — Other Ambulatory Visit: Payer: Self-pay

## 2020-01-10 VITALS — BP 173/93 | HR 66 | Ht 62.0 in | Wt 213.8 lb

## 2020-01-10 DIAGNOSIS — E782 Mixed hyperlipidemia: Secondary | ICD-10-CM | POA: Diagnosis not present

## 2020-01-10 DIAGNOSIS — I10 Essential (primary) hypertension: Secondary | ICD-10-CM

## 2020-01-10 DIAGNOSIS — E1165 Type 2 diabetes mellitus with hyperglycemia: Secondary | ICD-10-CM

## 2020-01-10 DIAGNOSIS — E1142 Type 2 diabetes mellitus with diabetic polyneuropathy: Secondary | ICD-10-CM | POA: Diagnosis not present

## 2020-01-10 LAB — POCT GLYCOSYLATED HEMOGLOBIN (HGB A1C): Hemoglobin A1C: 7.3 % — AB (ref 4.0–5.6)

## 2020-01-10 NOTE — Progress Notes (Signed)
01/10/2020   Endocrinology follow-up note   Subjective:    Patient ID: Patrick Taylor, male    DOB: 09-21-1951,    Past Medical History:  Diagnosis Date  . Anemia   . Asthma   . Carpal tunnel syndrome   . Chronic back pain   . Colon polyps   . DM (diabetes mellitus) ([place])   . HTN (hypertension)   . Hyperlipidemia    Past Surgical History:  Procedure Laterality Date  . ABDOMINAL HERNIA REPAIR    . COLONOSCOPY  01/24/09   rectal polyp/adenomatous poylps/tubulovillous  . COLONOSCOPY  03/02/2012   Procedure: COLONOSCOPY;  Surgeon: Daneil Dolin, MD;  Location: [facility];  Service: Endoscopy;  Laterality: N/A;  11:00   Social History   Socioeconomic History  . Marital status: Married    Spouse name: Not on file  . Number of children: Not on file  . Years of education: Not on file  . Highest education level: Not on file  Occupational History  . Occupation: works at [employer]  . Smoking status: Former Smoker    Quit date: 02/18/1999    Years since quitting: 20.9  . Smokeless tobacco: Never Used  Substance and Sexual Activity  . Alcohol use: No    Comment: no heavy use in past  . Drug use: No  . Sexual activity: Not on file  Other Topics Concern  . Not on file  Social History Narrative  . Not on file   Social Determinants of Health   Financial Resource Strain:   . Difficulty of Paying Living Expenses: Not on file  Food Insecurity:   . Worried About Charity fundraiser in the Last Year: Not on file  . Ran Out of Food in the Last Year: Not on file  Transportation Needs:   . Lack of Transportation (Medical): Not on file  . Lack of Transportation (Non-Medical): Not on file  Physical Activity:   . Days of Exercise per Week: Not on file  . Minutes of Exercise per Session: Not on file  Stress:   . Feeling of Stress : Not on file  Social Connections:   . Frequency of Communication with Friends and Family: Not on file  . Frequency of Social  Gatherings with Friends and Family: Not on file  . Attends Religious Services: Not on file  . Active Member of Clubs or Organizations: Not on file  . Attends Archivist Meetings: Not on file  . Marital Status: Not on file   Outpatient Encounter Medications as of 01/10/2020  Medication Sig  . albuterol (VENTOLIN HFA) 108 (90 Base) MCG/ACT inhaler   . aspirin 81 MG tablet Take 81 mg by mouth daily.  . Blood Glucose Monitoring Suppl (ACCU-CHEK AVIVA PLUS) w/Device KIT Use as directed bid E11.65  . budesonide-formoterol (SYMBICORT) 80-4.5 MCG/ACT inhaler Inhale 2 puffs into the lungs 2 (two) times daily as needed (shortness of breath).   . Cholecalciferol (VITAMIN D3) 5000 units CAPS Take 1 capsule (5,000 Units total) by mouth daily.  . enalapril (VASOTEC) 20 MG tablet Take 1 tablet (20 mg total) by mouth daily.  . furosemide (LASIX) 20 MG tablet Take 20 mg by mouth daily.   [name] Kitchen gabapentin (NEURONTIN) 300 MG capsule Take 300 mg by mouth 3 (three) times daily.   [name] Kitchen glucose blood (ACCU-CHEK AVIVA PLUS) test strip 1 each by Other route 3 (three) times daily. Use as instructed 2 x daily. E11.65  .  HYDROcodone-acetaminophen (NORCO/VICODIN) 5-325 MG tablet Take 1 tablet by mouth 2 (two) times daily as needed for moderate pain.  . Insulin Pen Needle (B-D UF III MINI PEN NEEDLES) 31G X 5 MM MISC USE FOR INSULIN INJECTIONS 2 TIMES A DAY  . liraglutide (VICTOZA) 18 MG/3ML SOPN Inject 1.2 mg into the skin daily.  . metFORMIN (GLUCOPHAGE) 1000 MG tablet TAKE 1 TABLET BY MOUTH 2 TIMES DAILY WITH A MEAL.  [name] Kitchen NOVOLIN 70/30 FLEXPEN RELION (70-30) 100 UNIT/ML PEN INJECT 18 UNITS SUBCUTANEOUSLY TWICE DAILY BEFORE A MEAL  . pravastatin (PRAVACHOL) 20 MG tablet Take 20 mg by mouth daily.   No facility-administered encounter medications on file as of 01/10/2020.   ALLERGIES: No Known Allergies VACCINATION STATUS:  There is no immunization history on file for this patient.  Diabetes Patrick Taylor presents for his  follow-up diabetic visit. Patrick Taylor has type 2 diabetes mellitus. Onset time: Patrick Taylor was diagnosed at approximate age of 73 years. His disease course has been stable. There are no hypoglycemic associated symptoms. Pertinent negatives for hypoglycemia include no confusion, headaches, pallor or seizures. Pertinent negatives for diabetes include no chest pain, no fatigue, no polydipsia, no polyphagia, no polyuria and no weakness. There are no hypoglycemic complications. Symptoms are stable. Risk factors for coronary artery disease include dyslipidemia, diabetes mellitus, hypertension and sedentary lifestyle. Current diabetic treatment includes oral agent (monotherapy). Patrick Taylor is compliant with treatment most of the time. His weight is increasing steadily. Patrick Taylor is following a generally unhealthy diet. Patrick Taylor rarely participates in exercise. His home blood glucose trend is fluctuating minimally. His breakfast blood glucose range is generally 130-140 mg/dl. His bedtime blood glucose range is generally 130-140 mg/dl. His overall blood glucose range is 130-140 mg/dl. (Patrick Taylor presents with controlled glycemic profile.  His point-of-care A1c is stable at 7.3%.) An ACE inhibitor/angiotensin II receptor blocker is being taken. Eye exam is current.  Hyperlipidemia This is a chronic problem. The current episode started more than 1 year ago. Exacerbating diseases include diabetes and obesity. Pertinent negatives include no chest pain, myalgias or shortness of breath. Current antihyperlipidemic treatment includes statins. Risk factors for coronary artery disease include diabetes mellitus, male sex, a sedentary lifestyle and obesity.  Hypertension This is a chronic problem. The current episode started more than 1 year ago. Pertinent negatives include no chest pain, headaches, neck pain, palpitations or shortness of breath. Risk factors for coronary artery disease include dyslipidemia, diabetes mellitus, obesity, male gender, sedentary lifestyle and  smoking/tobacco exposure. Past treatments include ACE inhibitors.    Review of systems:  Constitutional: + Progressive weight gain, no fatigue, no subjective hyperthermia, no subjective hypothermia Eyes: no blurry vision, no xerophthalmia ENT: no sore throat, no nodules palpated in throat, no dysphagia/odynophagia, no hoarseness Cardiovascular: no Chest Pain, no Shortness of Breath, no palpitations, no leg swelling Respiratory: no cough, no SOB Gastrointestinal: no Nausea/Vomiting/Diarhhea Musculoskeletal: no muscle/joint aches Skin: no rashes Neurological: no tremors, no numbness, no tingling, no dizziness Psychiatric: no depression, no anxiety   Objective:    BP (!) 173/93   Pulse 66   Ht '5\' 2"'  (1.575 m)   Wt 213 lb 12.8 oz (97 kg)   BMI 39.10 kg/m   Wt Readings from Last 3 Encounters:  01/10/20 213 lb 12.8 oz (97 kg)  01/08/19 207 lb (93.9 kg)  09/06/18 206 lb (93.4 kg)    Physical Exam- Limited  Constitutional:  Body mass index is 39.1 kg/m. , not in acute distress, normal state of mind Eyes:  EOMI,  no exophthalmos Neck: Supple Respiratory: Adequate breathing efforts Musculoskeletal: no gross deformities, strength intact in all four extremities, no gross restriction of joint movements Skin:  no rashes, no hyperemia Neurological: no tremor with outstretched hands.   Results for orders placed or performed in visit on 01/10/20  HgB A1c  Result Value Ref Range   Hemoglobin A1C 7.3 (A) 4.0 - 5.6 %   HbA1c POC (<> result, manual entry)     HbA1c, POC (prediabetic range)     HbA1c, POC (controlled diabetic range)    Diabetic Labs (most recent): Lab Results  Component Value Date   HGBA1C 7.3 (A) 01/10/2020   HGBA1C 7.3 (H) 09/05/2019   HGBA1C 7.3 (H) 05/01/2019    Lipid Panel     Component Value Date/Time   CHOL 122 09/01/2018 0855   TRIG 83 09/01/2018 0855   HDL 41 09/01/2018 0855   CHOLHDL 4.0 12/01/2016 0748   LDLCALC 64 09/01/2018 0855    Assessment &  Plan:   1. Uncontrolled type 2 diabetes mellitus without complication, with long-term current use of insulin ([place])  His presents with controlled glycemic profile , his point-of-care A1c is stable at 7.3%  progressively improving from 10.1%.     Glucose logs and insulin administration records pertaining to this visit,  to be scanned into patient's records.  Recent labs reviewed. - Patient remains at a high risk for more acute and chronic complications of diabetes which include CAD, CVA, CKD, retinopathy, and neuropathy. These are all discussed in detail with the patient.  - I have re-counseled the patient on diet management and weight loss  by adopting a carbohydrate restricted / protein rich  Diet. - Patient is advised to stick to a routine mealtimes to eat 3 meals  a day and avoid unnecessary snacks ( to snack only to correct hypoglycemia).  - Patrick Taylor  admits there is a room for improvement in his diet and drink choices. -  Suggestion is made for him to avoid simple carbohydrates  from his diet including Cakes, Sweet Desserts / Pastries, Ice Cream, Soda (diet and regular), Sweet Tea, Candies, Chips, Cookies, Sweet Pastries,  Store Bought Juices, Alcohol in Excess of  1-2 drinks a day, Artificial Sweeteners, Coffee Creamer, and "Sugar-free" Products. This will help patient to have stable blood glucose profile and potentially avoid unintended weight gain.   - I have approached patient with the following individualized plan to manage diabetes and patient agrees.  -Patrick Taylor has responded very well to premixed insulin using Novolin 70/30.  -Patrick Taylor is advised to continue Novolin 70/30 18 units with breakfast and 18 units with supper for pre-meal blood glucose above 90 mg/dL.   -Patrick Taylor will continue  strict monitoring of glucose at least 2 times a day-daily before breakfast and before supper, and at any other time as needed.     -Patient is warned not to take insulin without proper monitoring per orders.  -Patient is  encouraged to call clinic for blood glucose levels less than 70 or above 300 mg /dl.  -Patrick Taylor is advised to continue metformin 1000 mg p.o. twice daily--after breakfast and supper.   -Patrick Taylor has gotten some coverage for Victoza.  Patrick Taylor is advised to continue  Victoza 1.2 mg subcutaneously daily.     - Patient specific target  for A1c; LDL, HDL, Triglycerides, and  Waist Circumference were discussed in detail.  2) BP/HTN: His blood pressure is not controlled to target today.  Patrick Taylor is advised to be consistent with  his blood pressure medications including enalapril 20 mg p.o. daily, Lasix 20 mg p.o. daily, and advised on salt restrictions.   3) Lipids/HPL: Recent lipid panel showed controlled LDL at 64.  Patrick Taylor is advised to continue pravastatin 20 mg p.o. nightly.    4)  Weight/Diet: His BMI 35.5-a candidate for modest weight loss.  CDE consult in progress, exercise, and carbohydrates information provided.  5) Chronic Care/Health Maintenance:  -Patient is on ACEI and Statin medications and encouraged to continue to follow up with Ophthalmology, Podiatrist at least yearly or according to recommendations, and advised to stay away from smoking. I have recommended yearly flu vaccine and pneumonia vaccination at least every 5 years; moderate intensity exercise for up to 150 minutes weekly; and  sleep for at least 7 hours a day.  4. Vitamin D deficiency -Patrick Taylor is currently on cholecalciferol 5000 units daily, status post therapy with vitamin D 50,000 units weekly for 12 weeks.    I advised patient to maintain close follow up with his PCP for primary care needs.  - Time spent on this patient care encounter:  35 min, of which > 50% was spent in  counseling and the rest reviewing his blood glucose logs , discussing his hypoglycemia and hyperglycemia episodes, reviewing his current and  previous labs / studies  ( including abstraction from other facilities) and medications  doses and developing a  long term treatment plan  and documenting his care.   Please refer to Patient Instructions for Blood Glucose Monitoring and Insulin/Medications Dosing Guide"  in media tab for additional information. Please  also refer to " Patient Self Inventory" in the Media  tab for reviewed elements of pertinent patient history.  Patrick Taylor participated in the discussions, expressed understanding, and voiced agreement with the above plans.  All questions were answered to his satisfaction. Patrick Taylor is encouraged to contact clinic should Patrick Taylor have any questions or concerns prior to his return visit.   Follow up plan: Return in about 5 months (around 06/09/2020) for Bring Meter and Logs- A1c in Office, Follow up with Pre-visit Labs.  Glade Lloyd, MD Phone: [phone]  Fax: [phone]  -  This note was partially dictated with voice recognition software. Similar sounding words can be transcribed inadequately or may not  be corrected upon review.  01/10/2020, 1:18 PM

## 2020-01-10 NOTE — Patient Instructions (Signed)
                                     Advice for Weight Management  -For most of us the best way to lose weight is by diet management. Generally speaking, diet management means consuming less calories intentionally which over time brings about progressive weight loss.  This can be achieved more effectively by restricting carbohydrate consumption to the minimum possible.  So, it is critically important to know your numbers: how much calorie you are consuming and how much calorie you need. More importantly, our carbohydrates sources should be unprocessed or minimally processed complex starch food items.   Sometimes, it is important to balance nutrition by increasing protein intake (animal or plant source), fruits, and vegetables.  -Sticking to a routine mealtime to eat 3 meals a day and avoiding unnecessary snacks is shown to have a big role in weight control. Under normal circumstances, the only time we lose real weight is when we are hungry, so allow hunger to take place- hunger means no food between meal times, only water.  It is not advisable to starve.   -It is better to avoid simple carbohydrates including: Cakes, Sweet Desserts, Ice Cream, Soda (diet and regular), Sweet Tea, Candies, Chips, Cookies, Store Bought Juices, Alcohol in Excess of  1-2 drinks a day, Artificial Sweeteners, Doughnuts, Coffee Creamers, "Sugar-free" Products, etc, etc.  This is not a complete list.....    -Consulting with certified diabetes educators is proven to provide you with the most accurate and current information on diet.  Also, you may be  interested in discussing diet options/exchanges , we can schedule a visit with Patrick Taylor, RDN, CDE for individualized nutrition education.  -Exercise: If you are able: 30 -60 minutes a day ,4 days a week, or 150 minutes a week.  The longer the better.  Combine stretch, strength, and aerobic activities.  If you were told in the past that you  have high risk for cardiovascular diseases, you may seek evaluation by your heart doctor prior to initiating moderate to intense exercise programs.                                  Additional Care Considerations for Diabetes   -Diabetes  is a chronic disease.  The most important care consideration is regular follow-up with your diabetes care provider with the goal being avoiding or delaying its complications and to take advantage of advances in medications and technology.    -Type 2 diabetes is known to coexist with other important comorbidities such as high blood pressure and high cholesterol.  It is critical to control not only the diabetes but also the high blood pressure and high cholesterol to minimize and delay the risk of complications including coronary artery disease, stroke, amputations, blindness, etc.    - Studies showed that people with diabetes will benefit from a class of medications known as ACE inhibitors and statins.  Unless there are specific reasons not to be on these medications, the standard of care is to consider getting one from these groups of medications at an optimal doses.  These medications are generally considered safe and proven to help protect the heart and the kidneys.    - People with diabetes are encouraged to initiate and maintain regular follow-up with eye doctors, foot doctors, dentists ,   and if necessary heart and kidney doctors.     - It is highly recommended that people with diabetes quit smoking or stay away from smoking, and get yearly  flu vaccine and pneumonia vaccine at least every 5 years.  One other important lifestyle recommendation is to ensure adequate sleep - at least 6-7 hours of uninterrupted sleep at night.  -Exercise: If you are able: 30 -60 minutes a day, 4 days a week, or 150 minutes a week.  The longer the better.  Combine stretch, strength, and aerobic activities.  If you were told in the past that you have high risk for cardiovascular  diseases, you may seek evaluation by your heart doctor prior to initiating moderate to intense exercise programs.     COVID-19 Vaccine Information can be found at: [email or web address] For questions related to vaccine distribution or appointments, please email [email or web address] or call [phone].        

## 2020-01-11 DIAGNOSIS — L84 Corns and callosities: Secondary | ICD-10-CM | POA: Diagnosis not present

## 2020-01-11 DIAGNOSIS — E1140 Type 2 diabetes mellitus with diabetic neuropathy, unspecified: Secondary | ICD-10-CM | POA: Diagnosis not present

## 2020-01-11 DIAGNOSIS — I10 Essential (primary) hypertension: Secondary | ICD-10-CM | POA: Diagnosis not present

## 2020-01-11 DIAGNOSIS — G894 Chronic pain syndrome: Secondary | ICD-10-CM | POA: Diagnosis not present

## 2020-01-11 DIAGNOSIS — M2011 Hallux valgus (acquired), right foot: Secondary | ICD-10-CM | POA: Diagnosis not present

## 2020-02-07 DIAGNOSIS — G894 Chronic pain syndrome: Secondary | ICD-10-CM | POA: Diagnosis not present

## 2020-02-21 ENCOUNTER — Other Ambulatory Visit: Payer: Self-pay | Admitting: "Endocrinology

## 2020-02-29 ENCOUNTER — Ambulatory Visit: Payer: Medicare Other | Attending: Internal Medicine

## 2020-02-29 DIAGNOSIS — Z23 Encounter for immunization: Secondary | ICD-10-CM

## 2020-02-29 NOTE — Progress Notes (Signed)
   Covid-19 Vaccination Clinic  Name:  KAYMAN STOLZENBURG    MRN: [id number] DOB: [birth year]  02/29/2020  Mr. Bellofatto was observed post Covid-19 immunization for 15 minutes without incident. He was provided with Vaccine Information Sheet and instruction to access the V-Safe system.   Mr. Savidge was instructed to call 911 with any severe reactions post vaccine: [name] Kitchen Difficulty breathing  . Swelling of face and throat  . A fast heartbeat  . A bad rash all over body  . Dizziness and weakness   Immunizations Administered    Name Date Dose VIS Date Route   Moderna COVID-19 Vaccine 02/29/2020  9:35 AM 0.5 mL 11/13/2019 Intramuscular   Manufacturer: Moderna   Lot: [id number]   [id number]

## 2020-03-12 DIAGNOSIS — E1165 Type 2 diabetes mellitus with hyperglycemia: Secondary | ICD-10-CM | POA: Diagnosis not present

## 2020-03-12 DIAGNOSIS — M47896 Other spondylosis, lumbar region: Secondary | ICD-10-CM | POA: Diagnosis not present

## 2020-03-12 DIAGNOSIS — M810 Age-related osteoporosis without current pathological fracture: Secondary | ICD-10-CM | POA: Diagnosis not present

## 2020-03-12 DIAGNOSIS — I10 Essential (primary) hypertension: Secondary | ICD-10-CM | POA: Diagnosis not present

## 2020-03-13 DIAGNOSIS — J449 Chronic obstructive pulmonary disease, unspecified: Secondary | ICD-10-CM | POA: Diagnosis not present

## 2020-03-13 DIAGNOSIS — G894 Chronic pain syndrome: Secondary | ICD-10-CM | POA: Diagnosis not present

## 2020-03-23 ENCOUNTER — Other Ambulatory Visit: Payer: Self-pay | Admitting: "Endocrinology

## 2020-03-24 ENCOUNTER — Telehealth: Payer: Self-pay

## 2020-03-24 MED ORDER — NOVOLIN 70/30 FLEXPEN RELION (70-30) 100 UNIT/ML ~~LOC~~ SUPN: PEN_INJECTOR | SUBCUTANEOUS | 0 refills | Status: DC

## 2020-03-24 NOTE — Telephone Encounter (Signed)
Rx refill sent to [facility],.

## 2020-03-24 NOTE — Telephone Encounter (Signed)
Please call in NOVOLIN 70/30 FLEXPEN RELION (70-30) 100 UNIT/ML PEN  To [facility] in [place]

## 2020-04-02 ENCOUNTER — Ambulatory Visit: Payer: Medicare Other | Attending: Internal Medicine

## 2020-04-02 DIAGNOSIS — Z23 Encounter for immunization: Secondary | ICD-10-CM

## 2020-04-02 NOTE — Progress Notes (Signed)
   Covid-19 Vaccination Clinic  Name:  Patrick Taylor    MRN: [id number] DOB: 02-26-1951  04/02/2020  [name] was observed post Covid-19 immunization for 15 minutes without incident. He was provided with Vaccine Information Sheet and instruction to access the V-Safe system.   [name] was instructed to call 911 with any severe reactions post vaccine: [name] Kitchen Difficulty breathing  . Swelling of face and throat  . A fast heartbeat  . A bad rash all over body  . Dizziness and weakness   Immunizations Administered    Name Date Dose VIS Date Route   Moderna COVID-19 Vaccine 04/02/2020  8:20 AM 0.5 mL 11/2019 Intramuscular   Manufacturer: Moderna   Lot: [id number]   [id number]

## 2020-04-04 DIAGNOSIS — E7849 Other hyperlipidemia: Secondary | ICD-10-CM | POA: Diagnosis not present

## 2020-04-04 DIAGNOSIS — Z1389 Encounter for screening for other disorder: Secondary | ICD-10-CM | POA: Diagnosis not present

## 2020-04-04 DIAGNOSIS — E039 Hypothyroidism, unspecified: Secondary | ICD-10-CM | POA: Diagnosis not present

## 2020-04-04 DIAGNOSIS — I10 Essential (primary) hypertension: Secondary | ICD-10-CM | POA: Diagnosis not present

## 2020-04-04 DIAGNOSIS — Z0001 Encounter for general adult medical examination with abnormal findings: Secondary | ICD-10-CM | POA: Diagnosis not present

## 2020-04-04 DIAGNOSIS — E119 Type 2 diabetes mellitus without complications: Secondary | ICD-10-CM | POA: Diagnosis not present

## 2020-04-04 DIAGNOSIS — R739 Hyperglycemia, unspecified: Secondary | ICD-10-CM | POA: Diagnosis not present

## 2020-04-04 DIAGNOSIS — J449 Chronic obstructive pulmonary disease, unspecified: Secondary | ICD-10-CM | POA: Diagnosis not present

## 2020-04-04 DIAGNOSIS — G894 Chronic pain syndrome: Secondary | ICD-10-CM | POA: Diagnosis not present

## 2020-04-11 DIAGNOSIS — I10 Essential (primary) hypertension: Secondary | ICD-10-CM | POA: Diagnosis not present

## 2020-04-11 DIAGNOSIS — M810 Age-related osteoporosis without current pathological fracture: Secondary | ICD-10-CM | POA: Diagnosis not present

## 2020-04-11 DIAGNOSIS — M47896 Other spondylosis, lumbar region: Secondary | ICD-10-CM | POA: Diagnosis not present

## 2020-04-11 DIAGNOSIS — E1165 Type 2 diabetes mellitus with hyperglycemia: Secondary | ICD-10-CM | POA: Diagnosis not present

## 2020-04-29 ENCOUNTER — Other Ambulatory Visit: Payer: Self-pay | Admitting: "Endocrinology

## 2020-05-09 DIAGNOSIS — J449 Chronic obstructive pulmonary disease, unspecified: Secondary | ICD-10-CM | POA: Diagnosis not present

## 2020-05-09 DIAGNOSIS — I10 Essential (primary) hypertension: Secondary | ICD-10-CM | POA: Diagnosis not present

## 2020-05-09 DIAGNOSIS — M1991 Primary osteoarthritis, unspecified site: Secondary | ICD-10-CM | POA: Diagnosis not present

## 2020-05-09 DIAGNOSIS — G894 Chronic pain syndrome: Secondary | ICD-10-CM | POA: Diagnosis not present

## 2020-05-12 DIAGNOSIS — I10 Essential (primary) hypertension: Secondary | ICD-10-CM | POA: Diagnosis not present

## 2020-05-12 DIAGNOSIS — J449 Chronic obstructive pulmonary disease, unspecified: Secondary | ICD-10-CM | POA: Diagnosis not present

## 2020-05-12 DIAGNOSIS — E1165 Type 2 diabetes mellitus with hyperglycemia: Secondary | ICD-10-CM | POA: Diagnosis not present

## 2020-05-12 DIAGNOSIS — Z794 Long term (current) use of insulin: Secondary | ICD-10-CM | POA: Diagnosis not present

## 2020-05-28 ENCOUNTER — Other Ambulatory Visit: Payer: Self-pay

## 2020-05-28 DIAGNOSIS — E1165 Type 2 diabetes mellitus with hyperglycemia: Secondary | ICD-10-CM

## 2020-05-28 MED ORDER — BD PEN NEEDLE MINI U/F 31G X 5 MM MISC: 5 refills | Status: DC

## 2020-05-29 ENCOUNTER — Other Ambulatory Visit: Payer: Self-pay | Admitting: "Endocrinology

## 2020-06-05 ENCOUNTER — Other Ambulatory Visit: Payer: Self-pay | Admitting: "Endocrinology

## 2020-06-05 DIAGNOSIS — E7849 Other hyperlipidemia: Secondary | ICD-10-CM | POA: Diagnosis not present

## 2020-06-05 DIAGNOSIS — E1165 Type 2 diabetes mellitus with hyperglycemia: Secondary | ICD-10-CM | POA: Diagnosis not present

## 2020-06-05 DIAGNOSIS — E782 Mixed hyperlipidemia: Secondary | ICD-10-CM | POA: Diagnosis not present

## 2020-06-05 LAB — VITAMIN D 25 HYDROXY (VIT D DEFICIENCY, FRACTURES): Vit D, 25-Hydroxy: 65.9

## 2020-06-05 LAB — COMPREHENSIVE METABOLIC PANEL
Calcium: 9.3 (ref 8.7–10.7)
GFR calc Af Amer: 83
GFR calc non Af Amer: 72

## 2020-06-05 LAB — BASIC METABOLIC PANEL
BUN: 26 — AB (ref 4–21)
CO2: 24 — AB (ref 13–22)
Chloride: 103 (ref 99–108)
Creatinine: 1.1 (ref 0.6–1.3)
Glucose: 91
Potassium: 4.6 (ref 3.4–5.3)
Sodium: 141 (ref 137–147)

## 2020-06-05 LAB — LIPID PANEL
Cholesterol: 151 (ref 0–200)
HDL: 41 (ref 35–70)
LDL Cholesterol: 93
Triglycerides: 89 (ref 40–160)

## 2020-06-05 LAB — TSH: TSH: 3.59 (ref 0.41–5.90)

## 2020-06-05 LAB — MICROALBUMIN, URINE: Microalb, Ur: <3

## 2020-06-06 LAB — COMPREHENSIVE METABOLIC PANEL
ALT: 7 IU/L (ref 0–44)
AST: 19 IU/L (ref 0–40)
Albumin/Globulin Ratio: 2 (ref 1.2–2.2)
Albumin: 4.5 g/dL (ref 3.8–4.8)
Alkaline Phosphatase: 61 IU/L (ref 48–121)
BUN/Creatinine Ratio: 25 — ABNORMAL HIGH (ref 10–24)
BUN: 26 mg/dL (ref 8–27)
Bilirubin Total: <0.2 mg/dL (ref 0.0–1.2)
CO2: 24 mmol/L (ref 20–29)
Calcium: 9.3 mg/dL (ref 8.6–10.2)
Chloride: 103 mmol/L (ref 96–106)
Creatinine, Ser: 1.06 mg/dL (ref 0.76–1.27)
GFR calc Af Amer: 83 mL/min/{1.73_m2} (ref >59)
GFR calc non Af Amer: 72 mL/min/{1.73_m2} (ref >59)
Globulin, Total: 2.3 g/dL (ref 1.5–4.5)
Glucose: 91 mg/dL (ref 65–99)
Potassium: 4.6 mmol/L (ref 3.5–5.2)
Sodium: 141 mmol/L (ref 134–144)
Total Protein: 6.8 g/dL (ref 6.0–8.5)

## 2020-06-06 LAB — MICROALBUMIN / CREATININE URINE RATIO
Creatinine, Urine: 18.7 mg/dL
Microalb/Creat Ratio: <16 mg/g creat (ref 0–29)
Microalbumin, Urine: <3 ug/mL

## 2020-06-06 LAB — LIPID PANEL W/O CHOL/HDL RATIO
Cholesterol, Total: 151 mg/dL (ref 100–199)
HDL: 41 mg/dL (ref >39)
LDL Chol Calc (NIH): 93 mg/dL (ref 0–99)
Triglycerides: 89 mg/dL (ref 0–149)
VLDL Cholesterol Cal: 17 mg/dL (ref 5–40)

## 2020-06-06 LAB — SPECIMEN STATUS REPORT

## 2020-06-06 LAB — T4, FREE: Free T4: 1.09 ng/dL (ref 0.82–1.77)

## 2020-06-06 LAB — TSH: TSH: 3.59 u[IU]/mL (ref 0.450–4.500)

## 2020-06-06 LAB — VITAMIN D 25 HYDROXY (VIT D DEFICIENCY, FRACTURES): Vit D, 25-Hydroxy: 65.9 ng/mL (ref 30.0–100.0)

## 2020-06-10 ENCOUNTER — Other Ambulatory Visit: Payer: Self-pay

## 2020-06-10 ENCOUNTER — Ambulatory Visit (INDEPENDENT_AMBULATORY_CARE_PROVIDER_SITE_OTHER): Payer: Medicare Other | Admitting: "Endocrinology

## 2020-06-10 ENCOUNTER — Encounter: Payer: Self-pay | Admitting: "Endocrinology

## 2020-06-10 VITALS — BP 172/75 | HR 51 | Ht 62.0 in | Wt 215.4 lb

## 2020-06-10 DIAGNOSIS — E559 Vitamin D deficiency, unspecified: Secondary | ICD-10-CM

## 2020-06-10 DIAGNOSIS — I10 Essential (primary) hypertension: Secondary | ICD-10-CM

## 2020-06-10 DIAGNOSIS — E1165 Type 2 diabetes mellitus with hyperglycemia: Secondary | ICD-10-CM | POA: Diagnosis not present

## 2020-06-10 DIAGNOSIS — E782 Mixed hyperlipidemia: Secondary | ICD-10-CM

## 2020-06-10 LAB — POCT GLYCOSYLATED HEMOGLOBIN (HGB A1C): Hemoglobin A1C: 7.1 % — AB (ref 4.0–5.6)

## 2020-06-10 NOTE — Patient Instructions (Signed)

## 2020-06-10 NOTE — Progress Notes (Signed)
06/10/2020   Endocrinology follow-up note   Subjective:    Patient ID: Patrick Taylor, male    DOB: 04-Nov-1951,    Past Medical History:  Diagnosis Date  . Anemia   . Asthma   . Carpal tunnel syndrome   . Chronic back pain   . Colon polyps   . DM (diabetes mellitus) ([place])   . HTN (hypertension)   . Hyperlipidemia    Past Surgical History:  Procedure Laterality Date  . ABDOMINAL HERNIA REPAIR    . COLONOSCOPY  01/24/09   rectal polyp/adenomatous poylps/tubulovillous  . COLONOSCOPY  03/02/2012   Procedure: COLONOSCOPY;  Surgeon: Patrick Dolin, MD;  Location: [facility];  Service: Endoscopy;  Laterality: N/A;  11:00   Social History   Socioeconomic History  . Marital status: Married    Spouse name: Not on file  . Number of children: Not on file  . Years of education: Not on file  . Highest education level: Not on file  Occupational History  . Occupation: works at [employer]  . Smoking status: Former Smoker    Quit date: 02/18/1999    Years since quitting: 21.3  . Smokeless tobacco: Never Used  Vaping Use  . Vaping Use: Never used  Substance and Sexual Activity  . Alcohol use: No    Comment: no heavy use in past  . Drug use: No  . Sexual activity: Not on file  Other Topics Concern  . Not on file  Social History Narrative  . Not on file   Social Determinants of Health   Financial Resource Strain:   . Difficulty of Paying Living Expenses:   Food Insecurity:   . Worried About Charity fundraiser in the Last Year:   . Arboriculturist in the Last Year:   Transportation Needs:   . Film/video editor (Medical):   [name] Kitchen Lack of Transportation (Non-Medical):   Physical Activity:   . Days of Exercise per Week:   . Minutes of Exercise per Session:   Stress:   . Feeling of Stress :   Social Connections:   . Frequency of Communication with Friends and Family:   . Frequency of Social Gatherings with Friends and Family:   . Attends Religious  Services:   . Active Member of Clubs or Organizations:   . Attends Archivist Meetings:   [name] Kitchen Marital Status:    Outpatient Encounter Medications as of 06/10/2020  Medication Sig  . albuterol (VENTOLIN HFA) 108 (90 Base) MCG/ACT inhaler   . aspirin 81 MG tablet Take 81 mg by mouth daily.  . Blood Glucose Monitoring Suppl (ACCU-CHEK AVIVA PLUS) w/Device KIT Use as directed bid E11.65  . budesonide-formoterol (SYMBICORT) 80-4.5 MCG/ACT inhaler Inhale 2 puffs into the lungs 2 (two) times daily as needed (shortness of breath).   . enalapril (VASOTEC) 20 MG tablet Take 1 tablet (20 mg total) by mouth daily.  . furosemide (LASIX) 20 MG tablet Take 20 mg by mouth daily.   [name] Kitchen gabapentin (NEURONTIN) 300 MG capsule Take 300 mg by mouth 3 (three) times daily.   [name] Kitchen glucose blood (ACCU-CHEK AVIVA PLUS) test strip USE 1 STRIP TO CHECK GLUCOSE 2 TO 3 TIMES DAILY  . HYDROcodone-acetaminophen (NORCO/VICODIN) 5-325 MG tablet Take 1 tablet by mouth 2 (two) times daily as needed for moderate pain.  . Insulin Pen Needle (B-D UF III MINI PEN NEEDLES) 31G X 5 MM MISC USE FOR INSULIN INJECTIONS 2 TIMES  A DAY  . liraglutide (VICTOZA) 18 MG/3ML SOPN Inject 1.2 mg into the skin daily.  . metFORMIN (GLUCOPHAGE) 1000 MG tablet TAKE 1 TABLET BY MOUTH 2 TIMES DAILY WITH A MEAL.  [name] Kitchen NOVOLIN 70/30 FLEXPEN RELION (70-30) 100 UNIT/ML KwikPen INJECT 18 UNITS SUBCUTANEOUSLY TWICE DAILY BEFORE A MEAL  . pravastatin (PRAVACHOL) 20 MG tablet Take 20 mg by mouth daily.  . [DISCONTINUED] Cholecalciferol (VITAMIN D3) 5000 units CAPS Take 1 capsule (5,000 Units total) by mouth daily.   No facility-administered encounter medications on file as of 06/10/2020.   ALLERGIES: No Known Allergies VACCINATION STATUS: Immunization History  Administered Date(s) Administered  . Moderna SARS-COVID-2 Vaccination 02/29/2020, 04/02/2020    Diabetes He presents for his follow-up diabetic visit. He has type 2 diabetes mellitus. Onset time:  He was diagnosed at approximate age of 50 years. His disease course has been worsening. There are no hypoglycemic associated symptoms. Pertinent negatives for hypoglycemia include no confusion, headaches, pallor or seizures. Pertinent negatives for diabetes include no chest pain, no fatigue, no polydipsia, no polyphagia, no polyuria and no weakness. There are no hypoglycemic complications. Symptoms are improving. Risk factors for coronary artery disease include dyslipidemia, diabetes mellitus, hypertension, sedentary lifestyle, male sex, obesity, tobacco exposure and family history. He is compliant with treatment most of the time. His weight is increasing steadily. He is following a generally unhealthy diet. He rarely participates in exercise. His home blood glucose trend is fluctuating minimally. His breakfast blood glucose range is generally 130-140 mg/dl. His bedtime blood glucose range is generally 130-140 mg/dl. His overall blood glucose range is 130-140 mg/dl. (He presents with controlled glycemic profile.  His point-of-care A1c is stable at 7.3%.) An ACE inhibitor/angiotensin II receptor blocker is being taken. Eye exam is current.  Hyperlipidemia This is a chronic problem. The current episode started more than 1 year ago. Exacerbating diseases include diabetes and obesity. Pertinent negatives include no chest pain, myalgias or shortness of breath. Current antihyperlipidemic treatment includes statins. Risk factors for coronary artery disease include diabetes mellitus, male sex, a sedentary lifestyle and obesity.  Hypertension This is a chronic problem. The current episode started more than 1 year ago. Pertinent negatives include no chest pain, headaches, neck pain, palpitations or shortness of breath. Risk factors for coronary artery disease include dyslipidemia, diabetes mellitus, obesity, male gender, sedentary lifestyle and smoking/tobacco exposure. Past treatments include ACE inhibitors.    Review  of systems:  Constitutional: + Progressive weight gain, no fatigue, no subjective hyperthermia, no subjective hypothermia Eyes: no blurry vision, no xerophthalmia ENT: no sore throat, no nodules palpated in throat, no dysphagia/odynophagia, no hoarseness Cardiovascular: no Chest Pain, no Shortness of Breath, no palpitations, no leg swelling Respiratory: no cough, no SOB Gastrointestinal: no Nausea/Vomiting/Diarhhea Musculoskeletal: no muscle/joint aches Skin: no rashes Neurological: no tremors, no numbness, no tingling, no dizziness Psychiatric: no depression, no anxiety   Objective:    BP (!) 172/75   Pulse (!) 51   Ht 5' 2" (1.575 m)   Wt 215 lb 6.4 oz (97.7 kg)   BMI 39.40 kg/m   Wt Readings from Last 3 Encounters:  06/10/20 215 lb 6.4 oz (97.7 kg)  01/10/20 213 lb 12.8 oz (97 kg)  01/08/19 207 lb (93.9 kg)     Physical Exam- Limited  Constitutional:  Body mass index is 39.4 kg/m. , not in acute distress, normal state of mind Eyes:  EOMI, no exophthalmos Neck: Supple Thyroid: No gross goiter Respiratory: Adequate breathing efforts Musculoskeletal: no gross  deformities, strength intact in all four extremities, no gross restriction of joint movements Skin:  no rashes, no hyperemia Neurological: no tremor with outstretched hands,     Results for orders placed or performed in visit on 06/10/20  HgB A1c  Result Value Ref Range   Hemoglobin A1C 7.1 (A) 4.0 - 5.6 %   HbA1c POC (<> result, manual entry)     HbA1c, POC (prediabetic range)     HbA1c, POC (controlled diabetic range)    Diabetic Labs (most recent): Lab Results  Component Value Date   HGBA1C 7.1 (A) 06/10/2020   HGBA1C 7.3 (A) 01/10/2020   HGBA1C 7.3 (H) 09/05/2019    Lipid Panel     Component Value Date/Time   CHOL 151 06/05/2020 0819   TRIG 89 06/05/2020 0819   HDL 41 06/05/2020 0819   CHOLHDL 4.0 12/01/2016 0748   LDLCALC 93 06/05/2020 0819   CMP Latest Ref Rng & Units 06/05/2020 06/05/2020  09/05/2019  Glucose 65 - 99 mg/dL 91 - 107(H)  BUN 8 - 27 mg/dL 26 26(A) 17  Creatinine 0.76 - 1.27 mg/dL 1.06 1.1 0.92  Sodium 134 - 144 mmol/L 141 141 144  Potassium 3.5 - 5.2 mmol/L 4.6 4.6 4.9  Chloride 96 - 106 mmol/L 103 103 103  CO2 20 - 29 mmol/L 24 24(A) 27  Calcium 8.6 - 10.2 mg/dL 9.3 9.3 9.4  Total Protein 6.0 - 8.5 g/dL 6.8 - 7.1  Total Bilirubin 0.0 - 1.2 mg/dL <0.2 - 0.2  Alkaline Phos 48 - 121 IU/L 61 - 67  AST 0 - 40 IU/L 19 - 21  ALT 0 - 44 IU/L 7 - 9     Assessment & Plan:   1. Uncontrolled type 2 diabetes mellitus without complication, with long-term current use of insulin ([place])  His presents with controlled glycemic profile , his point-of-care A1c is stable at 7.1%  progressively improving from 10.1%.     Glucose logs and insulin administration records pertaining to this visit,  to be scanned into patient's records.  Recent labs reviewed. - Patient remains at a high risk for more acute and chronic complications of diabetes which include CAD, CVA, CKD, retinopathy, and neuropathy. These are all discussed in detail with the patient.  - I have re-counseled the patient on diet management and weight loss  by adopting a carbohydrate restricted / protein rich  Diet. - Patient is advised to stick to a routine mealtimes to eat 3 meals  a day and avoid unnecessary snacks ( to snack only to correct hypoglycemia).  - he  admits there is a room for improvement in his diet and drink choices. -  Suggestion is made for him to avoid simple carbohydrates  from his diet including Cakes, Sweet Desserts / Pastries, Ice Cream, Soda (diet and regular), Sweet Tea, Candies, Chips, Cookies, Sweet Pastries,  Store Bought Juices, Alcohol in Excess of  1-2 drinks a day, Artificial Sweeteners, Coffee Creamer, and "Sugar-free" Products. This will help patient to have stable blood glucose profile and potentially avoid unintended weight gain.   - I have approached patient with the following  individualized plan to manage diabetes and patient agrees.  -He has responded very well to premixed insulin using Novolin 70/30.  -He is advised to continue Novolin 70/30 18 units with breakfast and 18 units with supper for pre-meal blood glucose above 90 mg/dL.   -He will continue strict monitoring of blood glucose at least twice a day-daily before breakfast and  before supper, and at any other time as needed.      -Patient is warned not to take insulin without proper monitoring per orders.  -Patient is encouraged to call clinic for blood glucose levels less than 70 or above 300 mg /dl.  -He is advised to continue Metformin  1000 mg p.o. twice daily--after breakfast and supper.   -He has gotten some coverage for Victoza.  He is advised to continue Victoza 1.2 mg subcu nightly daily.  - Patient specific target  for A1c; LDL, HDL, Triglycerides, and  Waist Circumference were discussed in detail.  2) BP/HTN: His blood pressure is not controlled to target.  he is advised to be consistent with his blood pressure medications including enalapril 20 mg p.o. daily, Lasix 20 mg p.o. daily, and advised on salt restrictions.   3) Lipids/HPL: Recent lipid panel showed controlled LDL at 64.  He is advised to continue pravastatin 20 mg p.o. nightly.    4)  Weight/Diet: His BMI 07.3-XTGGYIR complicating his diabetes care.  He is a candidate for modest weight loss.  CDE consult in progress, exercise, and carbohydrates information provided.  5) Chronic Care/Health Maintenance:  -Patient is on ACEI and Statin medications and encouraged to continue to follow up with Ophthalmology, Podiatrist at least yearly or according to recommendations, and advised to stay away from smoking. I have recommended yearly flu vaccine and pneumonia vaccination at least every 5 years; moderate intensity exercise for up to 150 minutes weekly; and  sleep for at least 7 hours a day.  4. Vitamin D deficiency -His vitamin D is  appropriate at 15.  He is advised to hold vitamin D supplements during the winter season.  At the start of winter, he will resume  cholecalciferol 5000 units daily.  I advised patient to maintain close follow up with his PCP for primary care needs.  - Time spent on this patient care encounter:  35 min, of which > 50% was spent in  counseling and the rest reviewing his blood glucose logs , discussing his hypoglycemia and hyperglycemia episodes, reviewing his current and  previous labs / studies  ( including abstraction from other facilities) and medications  doses and developing a  long term treatment plan and documenting his care.   Please refer to Patient Instructions for Blood Glucose Monitoring and Insulin/Medications Dosing Guide"  in media tab for additional information. Please  also refer to " Patient Self Inventory" in the Media  tab for reviewed elements of pertinent patient history.  Patrick Taylor participated in the discussions, expressed understanding, and voiced agreement with the above plans.  All questions were answered to his satisfaction. he is encouraged to contact clinic should he have any questions or concerns prior to his return visit.    Follow up plan: Return in about 4 months (around 10/10/2020) for Bring Meter and Logs- A1c in Office.  Glade Lloyd, MD Phone: [phone]  Fax: [phone]  -  This note was partially dictated with voice recognition software. Similar sounding words can be transcribed inadequately or may not  be corrected upon review.  06/10/2020, 8:53 AM

## 2020-07-03 DIAGNOSIS — R42 Dizziness and giddiness: Secondary | ICD-10-CM | POA: Diagnosis not present

## 2020-07-03 DIAGNOSIS — E1129 Type 2 diabetes mellitus with other diabetic kidney complication: Secondary | ICD-10-CM | POA: Diagnosis not present

## 2020-07-03 DIAGNOSIS — G894 Chronic pain syndrome: Secondary | ICD-10-CM | POA: Diagnosis not present

## 2020-07-10 ENCOUNTER — Other Ambulatory Visit: Payer: Self-pay | Admitting: "Endocrinology

## 2020-08-11 DIAGNOSIS — M1712 Unilateral primary osteoarthritis, left knee: Secondary | ICD-10-CM | POA: Diagnosis not present

## 2020-08-11 DIAGNOSIS — G894 Chronic pain syndrome: Secondary | ICD-10-CM | POA: Diagnosis not present

## 2020-08-11 DIAGNOSIS — M792 Neuralgia and neuritis, unspecified: Secondary | ICD-10-CM | POA: Diagnosis not present

## 2020-08-12 DIAGNOSIS — I10 Essential (primary) hypertension: Secondary | ICD-10-CM | POA: Diagnosis not present

## 2020-08-12 DIAGNOSIS — E7849 Other hyperlipidemia: Secondary | ICD-10-CM | POA: Diagnosis not present

## 2020-08-12 DIAGNOSIS — J449 Chronic obstructive pulmonary disease, unspecified: Secondary | ICD-10-CM | POA: Diagnosis not present

## 2020-08-12 DIAGNOSIS — Z794 Long term (current) use of insulin: Secondary | ICD-10-CM | POA: Diagnosis not present

## 2020-08-12 DIAGNOSIS — E1165 Type 2 diabetes mellitus with hyperglycemia: Secondary | ICD-10-CM | POA: Diagnosis not present

## 2020-09-21 ENCOUNTER — Other Ambulatory Visit: Payer: Self-pay | Admitting: "Endocrinology

## 2020-10-11 DIAGNOSIS — E1140 Type 2 diabetes mellitus with diabetic neuropathy, unspecified: Secondary | ICD-10-CM | POA: Diagnosis not present

## 2020-10-11 DIAGNOSIS — Z720 Tobacco use: Secondary | ICD-10-CM | POA: Diagnosis not present

## 2020-10-11 DIAGNOSIS — I2510 Atherosclerotic heart disease of native coronary artery without angina pectoris: Secondary | ICD-10-CM | POA: Diagnosis not present

## 2020-10-11 DIAGNOSIS — J449 Chronic obstructive pulmonary disease, unspecified: Secondary | ICD-10-CM | POA: Diagnosis not present

## 2020-10-15 ENCOUNTER — Ambulatory Visit (INDEPENDENT_AMBULATORY_CARE_PROVIDER_SITE_OTHER): Payer: Medicare Other | Admitting: Nurse Practitioner

## 2020-10-15 ENCOUNTER — Encounter: Payer: Self-pay | Admitting: Nurse Practitioner

## 2020-10-15 ENCOUNTER — Other Ambulatory Visit: Payer: Self-pay

## 2020-10-15 VITALS — BP 154/90 | HR 64 | Ht 62.0 in | Wt 215.0 lb

## 2020-10-15 DIAGNOSIS — E1165 Type 2 diabetes mellitus with hyperglycemia: Secondary | ICD-10-CM | POA: Diagnosis not present

## 2020-10-15 DIAGNOSIS — E782 Mixed hyperlipidemia: Secondary | ICD-10-CM

## 2020-10-15 DIAGNOSIS — E559 Vitamin D deficiency, unspecified: Secondary | ICD-10-CM | POA: Diagnosis not present

## 2020-10-15 DIAGNOSIS — I10 Essential (primary) hypertension: Secondary | ICD-10-CM

## 2020-10-15 DIAGNOSIS — E1140 Type 2 diabetes mellitus with diabetic neuropathy, unspecified: Secondary | ICD-10-CM | POA: Diagnosis not present

## 2020-10-15 LAB — POCT GLYCOSYLATED HEMOGLOBIN (HGB A1C): Hemoglobin A1C: 7.7 % — AB (ref 4.0–5.6)

## 2020-10-15 MED ORDER — NOVOLIN 70/30 FLEXPEN RELION (70-30) 100 UNIT/ML ~~LOC~~ SUPN: 15.0000 [IU] | PEN_INJECTOR | Freq: Two times a day (BID) | SUBCUTANEOUS | 3 refills | Status: DC

## 2020-10-15 NOTE — Patient Instructions (Signed)

## 2020-10-15 NOTE — Progress Notes (Signed)
10/15/2020   Endocrinology follow-up note   Subjective:    Patient ID: Patrick Taylor, male    DOB: 1951/04/16,    Past Medical History:  Diagnosis Date  . Anemia   . Asthma   . Carpal tunnel syndrome   . Chronic back pain   . Colon polyps   . DM (diabetes mellitus) ([place])   . HTN (hypertension)   . Hyperlipidemia    Past Surgical History:  Procedure Laterality Date  . ABDOMINAL HERNIA REPAIR    . COLONOSCOPY  01/24/09   rectal polyp/adenomatous poylps/tubulovillous  . COLONOSCOPY  03/02/2012   Procedure: COLONOSCOPY;  Surgeon: Daneil Dolin, MD;  Location: [facility];  Service: Endoscopy;  Laterality: N/A;  11:00   Social History   Socioeconomic History  . Marital status: Married    Spouse name: Not on file  . Number of children: Not on file  . Years of education: Not on file  . Highest education level: Not on file  Occupational History  . Occupation: works at [employer]  . Smoking status: Former Smoker    Quit date: 02/18/1999    Years since quitting: 21.6  . Smokeless tobacco: Never Used  Vaping Use  . Vaping Use: Never used  Substance and Sexual Activity  . Alcohol use: No    Comment: no heavy use in past  . Drug use: No  . Sexual activity: Not on file  Other Topics Concern  . Not on file  Social History Narrative  . Not on file   Social Determinants of Health   Financial Resource Strain:   . Difficulty of Paying Living Expenses: Not on file  Food Insecurity:   . Worried About Charity fundraiser in the Last Year: Not on file  . Ran Out of Food in the Last Year: Not on file  Transportation Needs:   . Lack of Transportation (Medical): Not on file  . Lack of Transportation (Non-Medical): Not on file  Physical Activity:   . Days of Exercise per Week: Not on file  . Minutes of Exercise per Session: Not on file  Stress:   . Feeling of Stress : Not on file  Social Connections:   . Frequency of Communication with Friends and Family:  Not on file  . Frequency of Social Gatherings with Friends and Family: Not on file  . Attends Religious Services: Not on file  . Active Member of Clubs or Organizations: Not on file  . Attends Archivist Meetings: Not on file  . Marital Status: Not on file   Outpatient Encounter Medications as of 10/15/2020  Medication Sig  . albuterol (VENTOLIN HFA) 108 (90 Base) MCG/ACT inhaler   . aspirin 81 MG tablet Take 81 mg by mouth daily.  . Blood Glucose Monitoring Suppl (ACCU-CHEK AVIVA PLUS) w/Device KIT Use as directed bid E11.65  . budesonide-formoterol (SYMBICORT) 80-4.5 MCG/ACT inhaler Inhale 2 puffs into the lungs 2 (two) times daily as needed (shortness of breath).   . enalapril (VASOTEC) 20 MG tablet Take 1 tablet (20 mg total) by mouth daily.  . furosemide (LASIX) 20 MG tablet Take 20 mg by mouth daily.   [name] Kitchen gabapentin (NEURONTIN) 300 MG capsule Take 300 mg by mouth 3 (three) times daily.   [name] Kitchen glucose blood (ACCU-CHEK AVIVA PLUS) test strip USE 1 STRIP TO CHECK GLUCOSE 2 TO 3 TIMES DAILY  . HYDROcodone-acetaminophen (NORCO/VICODIN) 5-325 MG tablet Take 1 tablet by mouth 2 (two) times  daily as needed for moderate pain.  [name] Kitchen insulin isophane & regular human (NOVOLIN 70/30 FLEXPEN RELION) (70-30) 100 UNIT/ML KwikPen Inject 15 Units into the skin 2 (two) times daily with a meal.  . Insulin Pen Needle (B-D UF III MINI PEN NEEDLES) 31G X 5 MM MISC USE FOR INSULIN INJECTIONS 2 TIMES A DAY  . liraglutide (VICTOZA) 18 MG/3ML SOPN Inject 1.2 mg into the skin daily.  . meclizine (ANTIVERT) 25 MG tablet Take 25 mg by mouth 3 (three) times daily as needed.  . metFORMIN (GLUCOPHAGE) 500 MG tablet Take 1,000 mg by mouth 2 (two) times daily with a meal. Patient taking 2 57m tabs at breakfast and supper, 1 5040mtab at lunch  . naproxen (NAPROSYN) 375 MG tablet Take 375 mg by mouth 2 (two) times daily.  . pravastatin (PRAVACHOL) 20 MG tablet Take 20 mg by mouth daily.  . [name] Kitchenerbinafine (LAMISIL) 250  MG tablet Take 250 mg by mouth daily.  . [DISCONTINUED] metFORMIN (GLUCOPHAGE) 1000 MG tablet TAKE 1 TABLET BY MOUTH 2 TIMES DAILY WITH A MEAL.  . [DISCONTINUED] NOVOLIN 70/30 FLEXPEN RELION (70-30) 100 UNIT/ML KwikPen INJECT 18 UNITS SUBCUTANEOUSLY TWICE DAILY BEFORE A MEAL   No facility-administered encounter medications on file as of 10/15/2020.   ALLERGIES: No Known Allergies VACCINATION STATUS: Immunization History  Administered Date(s) Administered  . Moderna SARS-COVID-2 Vaccination 02/29/2020, 04/02/2020    Diabetes He presents for his follow-up diabetic visit. He has type 2 diabetes mellitus. Onset time: He was diagnosed at approximate age of [age]. His disease course has been improving. There are no hypoglycemic associated symptoms. Pertinent negatives for hypoglycemia include no confusion, headaches, pallor or seizures. Pertinent negatives for diabetes include no chest pain, no fatigue, no polydipsia, no polyphagia, no polyuria and no weakness. There are no hypoglycemic complications. Symptoms are improving. Diabetic complications include nephropathy. Risk factors for coronary artery disease include dyslipidemia, diabetes mellitus, hypertension, sedentary lifestyle, male sex, obesity, tobacco exposure and family history. Current diabetic treatment includes insulin injections and oral agent (monotherapy). He is compliant with treatment most of the time. His weight is fluctuating minimally. He is following a generally unhealthy diet. When asked about meal planning, he reported none. He has not had a previous visit with a dietitian. He rarely participates in exercise. His home blood glucose trend is fluctuating minimally. His breakfast blood glucose range is generally 90-110 mg/dl. His dinner blood glucose range is generally 140-180 mg/dl. (He presents today with his logs, no meter, showing at target fasting and postprandial glycemic profile.  His POCT A1C today is 7.7%, increasing from last  visit of 7.1%.  He has a history of some anemia, which may result in erroneously high A1C numbers.  He does report he finds himself eating more snacks to avoid low blood glucose.  He denies any significant episodes of hypoglycemia.) An ACE inhibitor/angiotensin II receptor blocker is being taken. He does not see a podiatrist.Eye exam is current.  Hyperlipidemia This is a chronic problem. The current episode started more than 1 year ago. The problem is controlled. Recent lipid tests were reviewed and are normal. Exacerbating diseases include chronic renal disease, diabetes and obesity. There are no known factors aggravating his hyperlipidemia. Pertinent negatives include no chest pain, myalgias or shortness of breath. Current antihyperlipidemic treatment includes statins. The current treatment provides moderate improvement of lipids. There are no compliance problems.  Risk factors for coronary artery disease include diabetes mellitus, male sex, a sedentary lifestyle, obesity, hypertension and dyslipidemia.  Hypertension This is a chronic problem. The current episode started more than 1 year ago. The problem has been gradually improving since onset. The problem is uncontrolled. Pertinent negatives include no chest pain, headaches, neck pain, palpitations or shortness of breath. There are no associated agents to hypertension. Risk factors for coronary artery disease include dyslipidemia, diabetes mellitus, obesity, male gender, sedentary lifestyle and smoking/tobacco exposure. Past treatments include ACE inhibitors and diuretics. The current treatment provides mild improvement. There are no compliance problems.  Identifiable causes of hypertension include chronic renal disease.    Review of systems  Constitutional: + Minimally fluctuating body weight,  current Body mass index is 39.32 kg/m. , no fatigue, no subjective hyperthermia, no subjective hypothermia Eyes: no blurry vision, no xerophthalmia ENT: no  sore throat, no nodules palpated in throat, no dysphagia/odynophagia, no hoarseness Cardiovascular: no chest pain, no shortness of breath, no palpitations, no leg swelling Respiratory: no cough, no shortness of breath Gastrointestinal: no nausea/vomiting/diarrhea Musculoskeletal: no muscle/joint aches Skin: no rashes, no hyperemia Neurological: no tremors, no numbness, no tingling, no dizziness Psychiatric: no depression, no anxiety   Objective:    BP (!) 154/90 (BP Location: Left Arm, Patient Position: Sitting)   Pulse 64   Ht 5' 2" (1.575 m)   Wt 215 lb (97.5 kg)   BMI 39.32 kg/m   Wt Readings from Last 3 Encounters:  10/15/20 215 lb (97.5 kg)  06/10/20 215 lb 6.4 oz (97.7 kg)  01/10/20 213 lb 12.8 oz (97 kg)    BP Readings from Last 3 Encounters:  10/15/20 (!) 154/90  06/10/20 (!) 172/75  01/10/20 (!) 173/93    Physical Exam- Limited  Constitutional:  Body mass index is 39.32 kg/m. , not in acute distress, normal state of mind Eyes:  EOMI, no exophthalmos Neck: Supple Thyroid: No gross goiter Cardiovascular: RRR, no murmers, rubs, or gallops, no edema Respiratory: Adequate breathing efforts, no crackles, rales, rhonchi, or wheezing Musculoskeletal: no gross deformities, strength intact in all four extremities, no gross restriction of joint movements Skin:  no rashes, no hyperemia Neurological: no tremor with outstretched hands   Foot exam:   No rashes, ulcers, cuts, calluses, onychodystrophy (PCP recently treated patient for fungal toenail infection which seems to have cleared up).   Decreased pulses bilat.  Good sensation to 10 g monofilament bilat.   Results for orders placed or performed in visit on 10/15/20  HgB A1c  Result Value Ref Range   Hemoglobin A1C 7.7 (A) 4.0 - 5.6 %   HbA1c POC (<> result, manual entry)     HbA1c, POC (prediabetic range)     HbA1c, POC (controlled diabetic range)    Diabetic Labs (most recent): Lab Results  Component Value  Date   HGBA1C 7.7 (A) 10/15/2020   HGBA1C 7.1 (A) 06/10/2020   HGBA1C 7.3 (A) 01/10/2020    Lipid Panel     Component Value Date/Time   CHOL 151 06/05/2020 0819   TRIG 89 06/05/2020 0819   HDL 41 06/05/2020 0819   CHOLHDL 4.0 12/01/2016 0748   LDLCALC 93 06/05/2020 0819   CMP Latest Ref Rng & Units 06/05/2020 06/05/2020 09/05/2019  Glucose 65 - 99 mg/dL 91 - 107(H)  BUN 8 - 27 mg/dL 26 26(A) 17  Creatinine 0.76 - 1.27 mg/dL 1.06 1.1 0.92  Sodium 134 - 144 mmol/L 141 141 144  Potassium 3.5 - 5.2 mmol/L 4.6 4.6 4.9  Chloride 96 - 106 mmol/L 103 103 103  CO2 20 - 29 mmol/L  24 24(A) 27  Calcium 8.6 - 10.2 mg/dL 9.3 9.3 9.4  Total Protein 6.0 - 8.5 g/dL 6.8 - 7.1  Total Bilirubin 0.0 - 1.2 mg/dL <0.2 - 0.2  Alkaline Phos 48 - 121 IU/L 61 - 67  AST 0 - 40 IU/L 19 - 21  ALT 0 - 44 IU/L 7 - 9     Assessment & Plan:   1. Uncontrolled type 2 diabetes mellitus without complication, with long-term current use of insulin ([place])  He presents today with his logs, no meter, showing at target fasting and postprandial glycemic profile.  His POCT A1C today is 7.7%, increasing from last visit of 7.1%.  He has a history of some anemia, which may result in erroneously high A1C numbers.  He does report he finds himself eating more snacks to avoid low blood glucose.  He denies any significant episodes of hypoglycemia.    Glucose logs and insulin administration records pertaining to this visit,  to be scanned into patient's records.  Recent labs reviewed.  - Patient remains at a high risk for more acute and chronic complications of diabetes which include CAD, CVA, CKD, retinopathy, and neuropathy. These are all discussed in detail with the patient.  - Nutritional counseling repeated at each appointment due to patients tendency to fall back in to old habits.  - The patient admits there is a room for improvement in their diet and drink choices. -  Suggestion is made for the patient to avoid simple  carbohydrates from their diet including Cakes, Sweet Desserts / Pastries, Ice Cream, Soda (diet and regular), Sweet Tea, Candies, Chips, Cookies, Sweet Pastries,  Store Bought Juices, Alcohol in Excess of  1-2 drinks a day, Artificial Sweeteners, Coffee Creamer, and "Sugar-free" Products. This will help patient to have stable blood glucose profile and potentially avoid unintended weight gain.   - I encouraged the patient to switch to  unprocessed or minimally processed complex starch and increased protein intake (animal or plant source), fruits, and vegetables.   - Patient is advised to stick to a routine mealtimes to eat 3 meals  a day and avoid unnecessary snacks ( to snack only to correct hypoglycemia).  - I have approached patient with the following individualized plan to manage diabetes and patient agrees.  -He has responded very well to premixed insulin using Novolin 70/30.  -Given his stable glycemic profile with at target fasting and postprandial glucose readings, he will tolerate slight decrease in his Novolin 70/30 to 15 units twice daily with breakfast and supper if his glucose is above 90 and he is eating.  He is advised to continue Victoza 1.2 mg SQ daily.  -He is on a high dose of Metformin currently (2500 mg per day) taking 2 tablets with breakfast, 1 with lunch, and 2 tablets with supper.  He will benefit from reduction of his Metformin to 1000 mg po twice daily with breakfast and supper. Will recheck kidney function prior to next visit.  -He is encouraged to monitor glucose at least 3 times daily, before injecting insulin (at breakfast and supper) and before bed, and call the clinic if he has readings less than 70 or greater than 200 for 3 days out of the week.  - Patient specific target  for A1c; LDL, HDL, Triglycerides, and  Waist Circumference were discussed in detail.  2) BP/HTN:  His blood pressure is not controlled to target yet it is improving.  He is advised to continue  Enalapril 20 mg  po daily and Lasix 40 mg po daily.  He is encouraged to monitor BP at home and notify me if it is consistently reading above 140/90 so adjustments can be made to his medication.  3) Lipids/HPL:  His most recent lipid panel from 06/05/20 shows controlled LDL at 93.  He is advised to continue Pravastatin 20 mg po daily at bedtime.  Side effects and precautions discussed with patient.  4)  Weight/Diet:  His Body mass index is 39.32 kg/m.--clearly complicating his diabetes care.  He is a candidate for modest weight loss.  CDE consult in progress, exercise, and carbohydrates information provided.  5) Chronic Care/Health Maintenance: -Patient is on ACEI and Statin medications and encouraged to continue to follow up with Ophthalmology, Podiatrist at least yearly or according to recommendations, and advised to stay away from smoking. I have recommended yearly flu vaccine and pneumonia vaccination at least every 5 years; moderate intensity exercise for up to 150 minutes weekly; and  sleep for at least 7 hours a day.  6) Vitamin D deficiency -His recent vitamin D level was 65.9 on 06/05/20.  He is advised to restart OTC Vitamin D3 5000 units daily during the winter season.   I advised patient to maintain close follow up with his PCP for primary care needs.  - Time spent on this patient care encounter:  35 min, of which > 50% was spent in  counseling and the rest reviewing his blood glucose logs , discussing his hypoglycemia and hyperglycemia episodes, reviewing his current and  previous labs / studies  ( including abstraction from other facilities) and medications  doses and developing a  long term treatment plan and documenting his care.   Please refer to Patient Instructions for Blood Glucose Monitoring and Insulin/Medications Dosing Guide"  in media tab for additional information. Please  also refer to " Patient Self Inventory" in the Media  tab for reviewed elements of pertinent patient  history.  Patrick Taylor participated in the discussions, expressed understanding, and voiced agreement with the above plans.  All questions were answered to his satisfaction. he is encouraged to contact clinic should he have any questions or concerns prior to his return visit.    Follow up plan: Return in about 4 months (around 02/12/2021) for Diabetes follow up with A1c in office, Previsit labs, Bring glucometer and logs.  Rayetta Pigg, [facility] [address] [place], [place] [address] Phone: [phone] Fax: [phone]  10/15/2020, 9:10 AM

## 2020-10-27 ENCOUNTER — Telehealth: Payer: Self-pay

## 2020-10-27 NOTE — Telephone Encounter (Signed)
Opened in error

## 2020-10-28 NOTE — Telephone Encounter (Signed)
These are good numbers overall.  Stay on same plan for now.

## 2020-10-28 NOTE — Telephone Encounter (Signed)
Spoke with patient and advised to continue as he has been, verbalized understanding.

## 2020-11-10 DIAGNOSIS — M1991 Primary osteoarthritis, unspecified site: Secondary | ICD-10-CM | POA: Diagnosis not present

## 2020-11-10 DIAGNOSIS — G894 Chronic pain syndrome: Secondary | ICD-10-CM | POA: Diagnosis not present

## 2020-11-10 DIAGNOSIS — M792 Neuralgia and neuritis, unspecified: Secondary | ICD-10-CM | POA: Diagnosis not present

## 2020-11-11 DIAGNOSIS — J449 Chronic obstructive pulmonary disease, unspecified: Secondary | ICD-10-CM | POA: Diagnosis not present

## 2020-11-11 DIAGNOSIS — E1140 Type 2 diabetes mellitus with diabetic neuropathy, unspecified: Secondary | ICD-10-CM | POA: Diagnosis not present

## 2020-11-11 DIAGNOSIS — I2510 Atherosclerotic heart disease of native coronary artery without angina pectoris: Secondary | ICD-10-CM | POA: Diagnosis not present

## 2020-11-11 DIAGNOSIS — Z720 Tobacco use: Secondary | ICD-10-CM | POA: Diagnosis not present

## 2020-11-12 DIAGNOSIS — Z23 Encounter for immunization: Secondary | ICD-10-CM | POA: Diagnosis not present

## 2020-12-19 DIAGNOSIS — E119 Type 2 diabetes mellitus without complications: Secondary | ICD-10-CM | POA: Diagnosis not present

## 2021-01-10 DIAGNOSIS — Z720 Tobacco use: Secondary | ICD-10-CM | POA: Diagnosis not present

## 2021-01-10 DIAGNOSIS — E1140 Type 2 diabetes mellitus with diabetic neuropathy, unspecified: Secondary | ICD-10-CM | POA: Diagnosis not present

## 2021-01-10 DIAGNOSIS — I2510 Atherosclerotic heart disease of native coronary artery without angina pectoris: Secondary | ICD-10-CM | POA: Diagnosis not present

## 2021-01-10 DIAGNOSIS — J449 Chronic obstructive pulmonary disease, unspecified: Secondary | ICD-10-CM | POA: Diagnosis not present

## 2021-01-19 DIAGNOSIS — R52 Pain, unspecified: Secondary | ICD-10-CM | POA: Diagnosis not present

## 2021-01-19 DIAGNOSIS — G894 Chronic pain syndrome: Secondary | ICD-10-CM | POA: Diagnosis not present

## 2021-01-19 DIAGNOSIS — I10 Essential (primary) hypertension: Secondary | ICD-10-CM | POA: Diagnosis not present

## 2021-02-06 DIAGNOSIS — E1165 Type 2 diabetes mellitus with hyperglycemia: Secondary | ICD-10-CM | POA: Diagnosis not present

## 2021-02-07 LAB — CBC WITH DIFFERENTIAL/PLATELET
Basophils Absolute: 0.1 10*3/uL (ref 0.0–0.2)
Basos: 1 %
EOS (ABSOLUTE): 0.4 10*3/uL (ref 0.0–0.4)
Eos: 4 %
Hematocrit: 39.7 % (ref 37.5–51.0)
Hemoglobin: 12.9 g/dL — ABNORMAL LOW (ref 13.0–17.7)
Immature Grans (Abs): 0 10*3/uL (ref 0.0–0.1)
Immature Granulocytes: 1 %
Lymphocytes Absolute: 3 10*3/uL (ref 0.7–3.1)
Lymphs: 34 %
MCH: 26.7 pg (ref 26.6–33.0)
MCHC: 32.5 g/dL (ref 31.5–35.7)
MCV: 82 fL (ref 79–97)
Monocytes Absolute: 0.6 10*3/uL (ref 0.1–0.9)
Monocytes: 7 %
Neutrophils Absolute: 4.7 10*3/uL (ref 1.4–7.0)
Neutrophils: 53 %
Platelets: 367 10*3/uL (ref 150–450)
RBC: 4.84 x10E6/uL (ref 4.14–5.80)
RDW: 13.2 % (ref 11.6–15.4)
WBC: 8.8 10*3/uL (ref 3.4–10.8)

## 2021-02-07 LAB — COMPREHENSIVE METABOLIC PANEL
ALT: 6 IU/L (ref 0–44)
AST: 21 IU/L (ref 0–40)
Albumin/Globulin Ratio: 1.9 (ref 1.2–2.2)
Albumin: 4.8 g/dL (ref 3.8–4.8)
Alkaline Phosphatase: 79 IU/L (ref 44–121)
BUN/Creatinine Ratio: 19 (ref 10–24)
BUN: 20 mg/dL (ref 8–27)
Bilirubin Total: 0.3 mg/dL (ref 0.0–1.2)
CO2: 24 mmol/L (ref 20–29)
Calcium: 9.6 mg/dL (ref 8.6–10.2)
Chloride: 101 mmol/L (ref 96–106)
Creatinine, Ser: 1.03 mg/dL (ref 0.76–1.27)
GFR calc Af Amer: 85 mL/min/{1.73_m2} (ref >59)
GFR calc non Af Amer: 74 mL/min/{1.73_m2} (ref >59)
Globulin, Total: 2.5 g/dL (ref 1.5–4.5)
Glucose: 80 mg/dL (ref 65–99)
Potassium: 4.4 mmol/L (ref 3.5–5.2)
Sodium: 141 mmol/L (ref 134–144)
Total Protein: 7.3 g/dL (ref 6.0–8.5)

## 2021-02-12 ENCOUNTER — Ambulatory Visit (INDEPENDENT_AMBULATORY_CARE_PROVIDER_SITE_OTHER): Payer: Medicare Other | Admitting: Nurse Practitioner

## 2021-02-12 ENCOUNTER — Other Ambulatory Visit: Payer: Self-pay

## 2021-02-12 ENCOUNTER — Encounter: Payer: Self-pay | Admitting: Nurse Practitioner

## 2021-02-12 VITALS — BP 180/84 | HR 66 | Ht 62.0 in | Wt 217.0 lb

## 2021-02-12 DIAGNOSIS — E1165 Type 2 diabetes mellitus with hyperglycemia: Secondary | ICD-10-CM

## 2021-02-12 DIAGNOSIS — E782 Mixed hyperlipidemia: Secondary | ICD-10-CM | POA: Diagnosis not present

## 2021-02-12 DIAGNOSIS — E559 Vitamin D deficiency, unspecified: Secondary | ICD-10-CM

## 2021-02-12 DIAGNOSIS — I10 Essential (primary) hypertension: Secondary | ICD-10-CM | POA: Diagnosis not present

## 2021-02-12 DIAGNOSIS — E1140 Type 2 diabetes mellitus with diabetic neuropathy, unspecified: Secondary | ICD-10-CM | POA: Diagnosis not present

## 2021-02-12 LAB — POCT GLYCOSYLATED HEMOGLOBIN (HGB A1C): Hemoglobin A1C: 8.4 % — AB (ref 4.0–5.6)

## 2021-02-12 NOTE — Patient Instructions (Signed)

## 2021-02-12 NOTE — Progress Notes (Signed)
02/12/2021   Endocrinology follow-up note   Subjective:    Patient ID: Patrick Taylor, male    DOB: May 16, 1951,    Past Medical History:  Diagnosis Date  . Anemia   . Asthma   . Carpal tunnel syndrome   . Chronic back pain   . Colon polyps   . DM (diabetes mellitus) ([place])   . HTN (hypertension)   . Hyperlipidemia    Past Surgical History:  Procedure Laterality Date  . ABDOMINAL HERNIA REPAIR    . COLONOSCOPY  01/24/09   rectal polyp/adenomatous poylps/tubulovillous  . COLONOSCOPY  03/02/2012   Procedure: COLONOSCOPY;  Surgeon: Daneil Dolin, MD;  Location: [facility];  Service: Endoscopy;  Laterality: N/A;  11:00   Social History   Socioeconomic History  . Marital status: Married    Spouse name: Not on file  . Number of children: Not on file  . Years of education: Not on file  . Highest education level: Not on file  Occupational History  . Occupation: works at [employer]  . Smoking status: Former Smoker    Quit date: 02/18/1999    Years since quitting: 22.0  . Smokeless tobacco: Never Used  Vaping Use  . Vaping Use: Never used  Substance and Sexual Activity  . Alcohol use: No    Comment: no heavy use in past  . Drug use: No  . Sexual activity: Not on file  Other Topics Concern  . Not on file  Social History Narrative  . Not on file   Social Determinants of Health   Financial Resource Strain: Not on file  Food Insecurity: Not on file  Transportation Needs: Not on file  Physical Activity: Not on file  Stress: Not on file  Social Connections: Not on file   Outpatient Encounter Medications as of 02/12/2021  Medication Sig  . albuterol (VENTOLIN HFA) 108 (90 Base) MCG/ACT inhaler   . aspirin 81 MG tablet Take 81 mg by mouth once a week.  . Blood Glucose Monitoring Suppl (ACCU-CHEK AVIVA PLUS) w/Device KIT Use as directed bid E11.65  . budesonide-formoterol (SYMBICORT) 80-4.5 MCG/ACT inhaler Inhale 2 puffs into the lungs 2 (two) times daily  as needed (shortness of breath).   . enalapril (VASOTEC) 20 MG tablet Take 1 tablet (20 mg total) by mouth daily.  . furosemide (LASIX) 20 MG tablet Take 20 mg by mouth daily.   [name] Kitchen gabapentin (NEURONTIN) 300 MG capsule Take 300 mg by mouth 3 (three) times daily.   [name] Kitchen glucose blood (ACCU-CHEK AVIVA PLUS) test strip USE 1 STRIP TO CHECK GLUCOSE 2 TO 3 TIMES DAILY  . HYDROcodone-acetaminophen (NORCO/VICODIN) 5-325 MG tablet Take 1 tablet by mouth 2 (two) times daily as needed for moderate pain.  [name] Kitchen insulin isophane & regular human (NOVOLIN 70/30 FLEXPEN RELION) (70-30) 100 UNIT/ML KwikPen Inject 15 Units into the skin 2 (two) times daily with a meal.  . Insulin Pen Needle (B-D UF III MINI PEN NEEDLES) 31G X 5 MM MISC USE FOR INSULIN INJECTIONS 2 TIMES A DAY  . liraglutide (VICTOZA) 18 MG/3ML SOPN Inject 1.2 mg into the skin daily.  . meclizine (ANTIVERT) 25 MG tablet Take 25 mg by mouth 3 (three) times daily as needed.  . metFORMIN (GLUCOPHAGE) 500 MG tablet Take 1,000 mg by mouth 2 (two) times daily with a meal. Patient taking 2 527m tabs at breakfast and supper, 1 5060mtab at lunch  . naproxen (NAPROSYN) 375 MG tablet Take 375  mg by mouth 2 (two) times daily.  . pravastatin (PRAVACHOL) 20 MG tablet Take 20 mg by mouth daily.  [name] Kitchen terbinafine (LAMISIL) 250 MG tablet Take 250 mg by mouth daily.   No facility-administered encounter medications on file as of 02/12/2021.   ALLERGIES: No Known Allergies VACCINATION STATUS: Immunization History  Administered Date(s) Administered  . Moderna Sars-Covid-2 Vaccination 02/29/2020, 04/02/2020    Diabetes He presents for his follow-up diabetic visit. He has type 2 diabetes mellitus. Onset time: He was diagnosed at approximate age of 89 years. His disease course has been stable. There are no hypoglycemic associated symptoms. Pertinent negatives for hypoglycemia include no confusion, headaches, pallor or seizures. Pertinent negatives for diabetes include no  chest pain, no fatigue, no polydipsia, no polyphagia, no polyuria and no weakness. There are no hypoglycemic complications. Symptoms are stable. Diabetic complications include nephropathy. Risk factors for coronary artery disease include dyslipidemia, diabetes mellitus, hypertension, sedentary lifestyle, male sex, obesity, tobacco exposure and family history. Current diabetic treatment includes insulin injections and oral agent (monotherapy). He is compliant with treatment most of the time. His weight is fluctuating minimally. He is following a generally unhealthy diet. When asked about meal planning, he reported none. He has not had a previous visit with a dietitian. He rarely participates in exercise. His home blood glucose trend is fluctuating minimally. His breakfast blood glucose range is generally 90-110 mg/dl. His lunch blood glucose range is generally 110-130 mg/dl. His dinner blood glucose range is generally 130-140 mg/dl. (He presents today with his meter and logs showing at goal glycemic profile both fasting and postprandially.  His POCT A1c today is 8.4%, increasing from last visit of 7.7%.  He does admit to over indulging during the holidays.  He denies any significant hypoglycemia.) An ACE inhibitor/angiotensin II receptor blocker is being taken. He does not see a podiatrist.Eye exam is current.  Hyperlipidemia This is a chronic problem. The current episode started more than 1 year ago. The problem is controlled. Recent lipid tests were reviewed and are normal. Exacerbating diseases include chronic renal disease, diabetes and obesity. There are no known factors aggravating his hyperlipidemia. Pertinent negatives include no chest pain, myalgias or shortness of breath. Current antihyperlipidemic treatment includes statins. The current treatment provides moderate improvement of lipids. There are no compliance problems.  Risk factors for coronary artery disease include diabetes mellitus, male sex, a  sedentary lifestyle, obesity, hypertension and dyslipidemia.  Hypertension This is a chronic problem. The current episode started more than 1 year ago. The problem has been gradually improving since onset. The problem is uncontrolled. Pertinent negatives include no chest pain, headaches, neck pain, palpitations or shortness of breath. There are no associated agents to hypertension. Risk factors for coronary artery disease include dyslipidemia, diabetes mellitus, obesity, male gender, sedentary lifestyle and smoking/tobacco exposure. Past treatments include ACE inhibitors and diuretics. The current treatment provides mild improvement. There are no compliance problems.  Identifiable causes of hypertension include chronic renal disease.    Review of systems  Constitutional: + Minimally fluctuating body weight,  current Body mass index is 39.69 kg/m. , no fatigue, no subjective hyperthermia, no subjective hypothermia Eyes: no blurry vision, no xerophthalmia ENT: no sore throat, no nodules palpated in throat, no dysphagia/odynophagia, no hoarseness Cardiovascular: no chest pain, no shortness of breath, no palpitations, no leg swelling Respiratory: no cough, no shortness of breath Gastrointestinal: no nausea/vomiting/diarrhea Musculoskeletal: no muscle/joint aches Skin: no rashes, no hyperemia Neurological: no tremors, no numbness, no tingling, no dizziness  Psychiatric: no depression, no anxiety   Objective:    BP (!) 180/84   Pulse 66   Ht '5\' 2"'  (1.575 m)   Wt 217 lb (98.4 kg)   BMI 39.69 kg/m   Wt Readings from Last 3 Encounters:  02/12/21 217 lb (98.4 kg)  10/15/20 215 lb (97.5 kg)  06/10/20 215 lb 6.4 oz (97.7 kg)    BP Readings from Last 3 Encounters:  02/12/21 (!) 180/84  10/15/20 (!) 154/90  06/10/20 (!) 172/75    Physical Exam- Limited  Constitutional:  Body mass index is 39.69 kg/m. , not in acute distress, normal state of mind Eyes:  EOMI, no exophthalmos Neck:  Supple Cardiovascular: RRR, no murmers, rubs, or gallops, no edema Respiratory: Adequate breathing efforts, no crackles, rales, rhonchi, or wheezing Musculoskeletal: no gross deformities, strength intact in all four extremities, no gross restriction of joint movements Skin:  no rashes, no hyperemia Neurological: no tremor with outstretched hands   POCT ABI Results 02/12/21   Right ABI:  1.19      Left ABI:  1.13  Right leg systolic / diastolic: 223/36 mmHg Left leg systolic / diastolic: 122/44 mmHg  Arm systolic / diastolic: 975/30 mmHG  Detailed report will be scanned into patient chart.  Results for orders placed or performed in visit on 02/12/21  HgB A1c  Result Value Ref Range   Hemoglobin A1C 8.4 (A) 4.0 - 5.6 %   HbA1c POC (<> result, manual entry)     HbA1c, POC (prediabetic range)     HbA1c, POC (controlled diabetic range)    Diabetic Labs (most recent): Lab Results  Component Value Date   HGBA1C 8.4 (A) 02/12/2021   HGBA1C 7.7 (A) 10/15/2020   HGBA1C 7.1 (A) 06/10/2020    Lipid Panel     Component Value Date/Time   CHOL 151 06/05/2020 0819   TRIG 89 06/05/2020 0819   HDL 41 06/05/2020 0819   CHOLHDL 4.0 12/01/2016 0748   LDLCALC 93 06/05/2020 0819   CMP Latest Ref Rng & Units 02/06/2021 06/05/2020 06/05/2020  Glucose 65 - 99 mg/dL 80 91 -  BUN 8 - 27 mg/dL 20 26 26(A)  Creatinine 0.76 - 1.27 mg/dL 1.03 1.06 1.1  Sodium 134 - 144 mmol/L 141 141 141  Potassium 3.5 - 5.2 mmol/L 4.4 4.6 4.6  Chloride 96 - 106 mmol/L 101 103 103  CO2 20 - 29 mmol/L 24 24 24(A)  Calcium 8.6 - 10.2 mg/dL 9.6 9.3 9.3  Total Protein 6.0 - 8.5 g/dL 7.3 6.8 -  Total Bilirubin 0.0 - 1.2 mg/dL 0.3 <0.2 -  Alkaline Phos 44 - 121 IU/L 79 61 -  AST 0 - 40 IU/L 21 19 -  ALT 0 - 44 IU/L 6 7 -     Assessment & Plan:   1) Uncontrolled type 2 diabetes mellitus without complication, with long-term current use of insulin ([place])  He presents today with his meter and logs showing at goal  glycemic profile both fasting and postprandially.  His POCT A1c today is 8.4%, increasing from last visit of 7.7%.  He does admit to over indulging during the holidays.  He denies any significant hypoglycemia.   Glucose logs and insulin administration records pertaining to this visit,  to be scanned into patient's records.  Recent labs reviewed.  - Patient remains at a high risk for more acute and chronic complications of diabetes which include CAD, CVA, CKD, retinopathy, and neuropathy. These are all discussed in detail  with the patient.  - Nutritional counseling repeated at each appointment due to patients tendency to fall back in to old habits.  - The patient admits there is a room for improvement in their diet and drink choices. -  Suggestion is made for the patient to avoid simple carbohydrates from their diet including Cakes, Sweet Desserts / Pastries, Ice Cream, Soda (diet and regular), Sweet Tea, Candies, Chips, Cookies, Sweet Pastries,  Store Bought Juices, Alcohol in Excess of  1-2 drinks a day, Artificial Sweeteners, Coffee Creamer, and "Sugar-free" Products. This will help patient to have stable blood glucose profile and potentially avoid unintended weight gain.   - I encouraged the patient to switch to  unprocessed or minimally processed complex starch and increased protein intake (animal or plant source), fruits, and vegetables.   - Patient is advised to stick to a routine mealtimes to eat 3 meals  a day and avoid unnecessary snacks ( to snack only to correct hypoglycemia). - I have approached patient with the following individualized plan to manage diabetes and patient agrees.  -He has responded very well to premixed insulin using Novolin 70/30.  -Given his stable glycemic profile with at target fasting and postprandial glucose readings, he will continue on current dose of Novolin 70/30 at 15 units twice daily with breakfast and supper if his glucose is above 90 and he is eating.  He  is advised to continue Victoza 1.2 mg SQ daily.  He is also advised to continue Metformin 1000 mg po twice daily with meals.  -He is encouraged to monitor glucose at least 3 times daily, before injecting insulin (at breakfast and supper) and before bed, and call the clinic if he has readings less than 70 or greater than 200 for 3 days out of the week.  - Patient specific target  for A1c; LDL, HDL, Triglycerides, and  Waist Circumference were discussed in detail.  2) BP/HTN:  His blood pressure is not controlled to target.  He had not long taken his medications prior to todays visit.  He is advised to continue Enalapril 20 mg po daily and Lasix 40 mg po daily.  He is encouraged to monitor BP at home and notify me if it is consistently reading above 140/90 so adjustments can be made to his medication.  3) Lipids/HPL:  His most recent lipid panel from 06/05/20 shows controlled LDL at 93.  He is advised to continue Pravastatin 20 mg po daily at bedtime.  Side effects and precautions discussed with patient.  Will recheck lipid panel prior to next visit.  4)  Weight/Diet:  His Body mass index is 39.69 kg/m.--clearly complicating his diabetes care.  He is a candidate for modest weight loss.  CDE consult in progress, exercise, and carbohydrates information provided.  5) Chronic Care/Health Maintenance: -Patient is on ACEI and Statin medications and encouraged to continue to follow up with Ophthalmology, Podiatrist at least yearly or according to recommendations, and advised to stay away from smoking. I have recommended yearly flu vaccine and pneumonia vaccination at least every 5 years; moderate intensity exercise for up to 150 minutes weekly; and  sleep for at least 7 hours a day.  6) Vitamin D deficiency -His recent vitamin D level was 65.9 on 06/05/20.  He is advised to restart OTC Vitamin D3 5000 units daily during the winter season.  Will recheck vitamin D level prior to next visit.   I advised  patient to maintain close follow up with his PCP for  primary care needs.  - Time spent on this patient care encounter:  40 min, of which > 50% was spent in  counseling and the rest reviewing his blood glucose logs , discussing his hypoglycemia and hyperglycemia episodes, reviewing his current and  previous labs / studies  ( including abstraction from other facilities) and medications  doses and developing a  long term treatment plan and documenting his care.   Please refer to Patient Instructions for Blood Glucose Monitoring and Insulin/Medications Dosing Guide"  in media tab for additional information. Please  also refer to " Patient Self Inventory" in the Media  tab for reviewed elements of pertinent patient history.  Patrick Taylor participated in the discussions, expressed understanding, and voiced agreement with the above plans.  All questions were answered to his satisfaction. he is encouraged to contact clinic should he have any questions or concerns prior to his return visit.    Follow up plan: Return in about 4 months (around 06/14/2021) for Diabetes follow up- A1c and urine micro in office, Previsit labs, Bring glucometer and logs.  Rayetta Pigg, [facility] [address] [place], [place] [address] Phone: [phone] Fax: [phone]  02/12/2021, 9:21 AM

## 2021-03-05 DIAGNOSIS — H11151 Pinguecula, right eye: Secondary | ICD-10-CM | POA: Diagnosis not present

## 2021-03-05 DIAGNOSIS — H02831 Dermatochalasis of right upper eyelid: Secondary | ICD-10-CM | POA: Diagnosis not present

## 2021-03-05 DIAGNOSIS — H02834 Dermatochalasis of left upper eyelid: Secondary | ICD-10-CM | POA: Diagnosis not present

## 2021-03-05 DIAGNOSIS — H25813 Combined forms of age-related cataract, bilateral: Secondary | ICD-10-CM | POA: Diagnosis not present

## 2021-03-11 DIAGNOSIS — J449 Chronic obstructive pulmonary disease, unspecified: Secondary | ICD-10-CM | POA: Diagnosis not present

## 2021-03-11 DIAGNOSIS — E1140 Type 2 diabetes mellitus with diabetic neuropathy, unspecified: Secondary | ICD-10-CM | POA: Diagnosis not present

## 2021-04-17 ENCOUNTER — Other Ambulatory Visit: Payer: Self-pay | Admitting: Nurse Practitioner

## 2021-04-21 ENCOUNTER — Other Ambulatory Visit: Payer: Self-pay | Admitting: "Endocrinology

## 2021-04-28 ENCOUNTER — Telehealth: Payer: Self-pay | Admitting: Nurse Practitioner

## 2021-04-28 MED ORDER — INSULIN ASPART PROT & ASPART (70-30 MIX) 100 UNIT/ML PEN: 15.0000 [IU] | PEN_INJECTOR | Freq: Two times a day (BID) | SUBCUTANEOUS | 3 refills | Status: DC

## 2021-04-28 NOTE — Telephone Encounter (Signed)
Pt came in and received in the mail that Novolin 70/30 is not on his formulary list and needs something else called in.  [place], [place] - [id number] [place] [address] [place] Phone:  [phone]  Fax:  [phone]

## 2021-04-28 NOTE — Telephone Encounter (Signed)
Sent in substitution to [facility].  Stay at same dosage for now.

## 2021-04-28 NOTE — Telephone Encounter (Signed)
Notified pt. 

## 2021-05-21 ENCOUNTER — Encounter: Payer: Self-pay | Admitting: *Deleted

## 2021-06-11 LAB — COMPREHENSIVE METABOLIC PANEL
ALT: 8 IU/L (ref 0–44)
AST: 21 IU/L (ref 0–40)
Albumin/Globulin Ratio: 2.3 — ABNORMAL HIGH (ref 1.2–2.2)
Albumin: 4.8 g/dL (ref 3.8–4.8)
Alkaline Phosphatase: 69 IU/L (ref 44–121)
BUN/Creatinine Ratio: 17 (ref 10–24)
BUN: 19 mg/dL (ref 8–27)
Bilirubin Total: 0.3 mg/dL (ref 0.0–1.2)
CO2: 24 mmol/L (ref 20–29)
Calcium: 10 mg/dL (ref 8.6–10.2)
Chloride: 100 mmol/L (ref 96–106)
Creatinine, Ser: 1.1 mg/dL (ref 0.76–1.27)
Globulin, Total: 2.1 g/dL (ref 1.5–4.5)
Glucose: 151 mg/dL — ABNORMAL HIGH (ref 65–99)
Potassium: 4.8 mmol/L (ref 3.5–5.2)
Sodium: 140 mmol/L (ref 134–144)
Total Protein: 6.9 g/dL (ref 6.0–8.5)
eGFR: 73 mL/min/{1.73_m2} (ref >59)

## 2021-06-11 LAB — T4, FREE: Free T4: 1.19 ng/dL (ref 0.82–1.77)

## 2021-06-11 LAB — LIPID PANEL
Chol/HDL Ratio: 4.2 ratio (ref 0.0–5.0)
Cholesterol, Total: 174 mg/dL (ref 100–199)
HDL: 41 mg/dL (ref >39)
LDL Chol Calc (NIH): 101 mg/dL — ABNORMAL HIGH (ref 0–99)
Triglycerides: 182 mg/dL — ABNORMAL HIGH (ref 0–149)
VLDL Cholesterol Cal: 32 mg/dL (ref 5–40)

## 2021-06-11 LAB — TSH: TSH: 3.08 u[IU]/mL (ref 0.450–4.500)

## 2021-06-11 LAB — VITAMIN D 25 HYDROXY (VIT D DEFICIENCY, FRACTURES): Vit D, 25-Hydroxy: 31 ng/mL (ref 30.0–100.0)

## 2021-06-17 ENCOUNTER — Ambulatory Visit (INDEPENDENT_AMBULATORY_CARE_PROVIDER_SITE_OTHER): Payer: Medicare Other | Admitting: Nurse Practitioner

## 2021-06-17 ENCOUNTER — Encounter: Payer: Self-pay | Admitting: Nurse Practitioner

## 2021-06-17 VITALS — BP 174/84 | HR 61 | Ht 62.0 in | Wt 216.0 lb

## 2021-06-17 DIAGNOSIS — E782 Mixed hyperlipidemia: Secondary | ICD-10-CM | POA: Diagnosis not present

## 2021-06-17 DIAGNOSIS — E1165 Type 2 diabetes mellitus with hyperglycemia: Secondary | ICD-10-CM

## 2021-06-17 DIAGNOSIS — I10 Essential (primary) hypertension: Secondary | ICD-10-CM | POA: Diagnosis not present

## 2021-06-17 DIAGNOSIS — E559 Vitamin D deficiency, unspecified: Secondary | ICD-10-CM | POA: Diagnosis not present

## 2021-06-17 LAB — POCT GLYCOSYLATED HEMOGLOBIN (HGB A1C): Hemoglobin A1C: 8.4 % — AB (ref 4.0–5.6)

## 2021-06-17 LAB — POCT UA - MICROALBUMIN
Albumin/Creatinine Ratio, Urine, POC: <30
Creatinine, POC: 10 mg/dL
Microalbumin Ur, POC: 10 mg/L

## 2021-06-17 MED ORDER — PRAVASTATIN SODIUM 40 MG PO TABS: 40.0000 mg | ORAL_TABLET | Freq: Every day | ORAL | 3 refills | Status: DC

## 2021-06-17 MED ORDER — METFORMIN HCL 500 MG PO TABS: 1000.0000 mg | ORAL_TABLET | Freq: Two times a day (BID) | ORAL | 3 refills | Status: DC

## 2021-06-17 MED ORDER — INSULIN ASPART PROT & ASPART (70-30 MIX) 100 UNIT/ML PEN: 15.0000 [IU] | PEN_INJECTOR | Freq: Two times a day (BID) | SUBCUTANEOUS | 3 refills | Status: DC

## 2021-06-17 NOTE — Progress Notes (Signed)
06/17/2021   Endocrinology follow-up note   Subjective:    Patient ID: Patrick Taylor, male    DOB: 1951/08/22,    Past Medical History:  Diagnosis Date   Anemia    Asthma    Carpal tunnel syndrome    Chronic back pain    Colon polyps    DM (diabetes mellitus) ([place])    HTN (hypertension)    Hyperlipidemia    Past Surgical History:  Procedure Laterality Date   ABDOMINAL HERNIA REPAIR     COLONOSCOPY  01/24/09   rectal polyp/adenomatous poylps/tubulovillous   COLONOSCOPY  03/02/2012   Procedure: COLONOSCOPY;  Surgeon: Daneil Dolin, MD;  Location: [facility];  Service: Endoscopy;  Laterality: N/A;  11:00   Social History   Socioeconomic History   Marital status: Married    Spouse name: Not on file   Number of children: Not on file   Years of education: Not on file   Highest education level: Not on file  Occupational History   Occupation: works at [employer] Use   Smoking status: Former    Pack years: 0.00    Types: Cigarettes    Quit date: 02/18/1999    Years since quitting: 22.3   Smokeless tobacco: Never  Vaping Use   Vaping Use: Never used  Substance and Sexual Activity   Alcohol use: No    Comment: no heavy use in past   Drug use: No   Sexual activity: Not on file  Other Topics Concern   Not on file  Social History Narrative   Not on file   Social Determinants of Health   Financial Resource Strain: Not on file  Food Insecurity: Not on file  Transportation Needs: Not on file  Physical Activity: Not on file  Stress: Not on file  Social Connections: Not on file   Outpatient Encounter Medications as of 06/17/2021  Medication Sig   albuterol (VENTOLIN HFA) 108 (90 Base) MCG/ACT inhaler    aspirin 81 MG tablet Take 81 mg by mouth once a week.   Blood Glucose Monitoring Suppl (ACCU-CHEK AVIVA PLUS) w/Device KIT Use as directed bid E11.65   budesonide-formoterol (SYMBICORT) 80-4.5 MCG/ACT inhaler Inhale 2 puffs into the lungs 2 (two) times  daily as needed (shortness of breath).    enalapril (VASOTEC) 20 MG tablet Take 1 tablet (20 mg total) by mouth daily.   furosemide (LASIX) 20 MG tablet Take 20 mg by mouth daily.    gabapentin (NEURONTIN) 300 MG capsule Take 300 mg by mouth 3 (three) times daily.    glucose blood (ACCU-CHEK AVIVA PLUS) test strip USE 1 STRIP TO CHECK GLUCOSE 2 TO 3 TIMES DAILY   HYDROcodone-acetaminophen (NORCO/VICODIN) 5-325 MG tablet Take 1 tablet by mouth 2 (two) times daily as needed for moderate pain.   insulin aspart protamine - aspart (NOVOLOG 70/30 MIX) (70-30) 100 UNIT/ML FlexPen Inject 0.15-0.2 mLs (15-20 Units total) into the skin 2 (two) times daily with a meal.   Insulin Pen Needle (B-D UF III MINI PEN NEEDLES) 31G X 5 MM MISC USE FOR INSULIN INJECTIONS 2 TIMES A DAY   liraglutide (VICTOZA) 18 MG/3ML SOPN Inject 1.2 mg into the skin daily.   meclizine (ANTIVERT) 25 MG tablet Take 25 mg by mouth 3 (three) times daily as needed.   metFORMIN (GLUCOPHAGE) 500 MG tablet Take 2 tablets (1,000 mg total) by mouth 2 (two) times daily with a meal. Patient taking 2 542m tabs at breakfast and supper,  1 553m tab at lunch   naproxen (NAPROSYN) 375 MG tablet Take 375 mg by mouth 2 (two) times daily.   pravastatin (PRAVACHOL) 40 MG tablet Take 1 tablet (40 mg total) by mouth daily.   terbinafine (LAMISIL) 250 MG tablet Take 250 mg by mouth daily.   [DISCONTINUED] insulin aspart protamine - aspart (NOVOLOG 70/30 MIX) (70-30) 100 UNIT/ML FlexPen Inject 0.15 mLs (15 Units total) into the skin 2 (two) times daily with a meal.   [DISCONTINUED] metFORMIN (GLUCOPHAGE) 500 MG tablet Take 1,000 mg by mouth 2 (two) times daily with a meal. Patient taking 2 5092mtabs at breakfast and supper, 1 50028mab at lunch   [DISCONTINUED] pravastatin (PRAVACHOL) 20 MG tablet Take 40 mg by mouth daily.   No facility-administered encounter medications on file as of 06/17/2021.   ALLERGIES: No Known Allergies VACCINATION  STATUS: Immunization History  Administered Date(s) Administered   Moderna Sars-Covid-2 Vaccination 02/29/2020, 04/02/2020    Diabetes He presents for his follow-up diabetic visit. He has type 2 diabetes mellitus. Onset time: He was diagnosed at approximate age of [age]. His disease course has been stable. There are no hypoglycemic associated symptoms. Pertinent negatives for hypoglycemia include no confusion, headaches, pallor or seizures. Pertinent negatives for diabetes include no chest pain, no fatigue, no polydipsia, no polyphagia, no polyuria and no weakness. There are no hypoglycemic complications. Symptoms are stable. Diabetic complications include nephropathy. Risk factors for coronary artery disease include dyslipidemia, diabetes mellitus, hypertension, sedentary lifestyle, male sex, obesity, tobacco exposure and family history. Current diabetic treatment includes insulin injections and oral agent (monotherapy). He is compliant with treatment most of the time. His weight is fluctuating minimally. He is following a generally unhealthy diet. When asked about meal planning, he reported none. He has not had a previous visit with a dietitian. He rarely participates in exercise. His home blood glucose trend is fluctuating minimally. His breakfast blood glucose range is generally 90-110 mg/dl. His lunch blood glucose range is generally 140-180 mg/dl. His dinner blood glucose range is generally 180-200 mg/dl. (He presents today, accompanied by his wife, with his logs, no meter, showing stable glycemic profile with at target fasting and slightly above target postprandial readings.  His POCT A1c today is 8.4%, unchanged from last visit.  He is seeing a neurosurgeon today regarding his back, has 2 bulging discs with nerve pain to his leg.  He denies any hypoglycemia.) An ACE inhibitor/angiotensin II receptor blocker is being taken. He does not see a podiatrist.Eye exam is current.  Hyperlipidemia This is a  chronic problem. The current episode started more than 1 year ago. The problem is uncontrolled. Recent lipid tests were reviewed and are high. Exacerbating diseases include chronic renal disease, diabetes and obesity. There are no known factors aggravating his hyperlipidemia. Pertinent negatives include no chest pain, myalgias or shortness of breath. Current antihyperlipidemic treatment includes statins. The current treatment provides moderate improvement of lipids. Compliance problems include adherence to diet and adherence to exercise.  Risk factors for coronary artery disease include diabetes mellitus, male sex, a sedentary lifestyle, obesity, hypertension and dyslipidemia.  Hypertension This is a chronic problem. The current episode started more than 1 year ago. The problem has been gradually improving since onset. The problem is uncontrolled. Pertinent negatives include no chest pain, headaches, neck pain, palpitations or shortness of breath. There are no associated agents to hypertension. Risk factors for coronary artery disease include dyslipidemia, diabetes mellitus, obesity, male gender, sedentary lifestyle and smoking/tobacco exposure.  Past treatments include ACE inhibitors and diuretics. The current treatment provides mild improvement. There are no compliance problems.  Identifiable causes of hypertension include chronic renal disease.    Review of systems  Constitutional: + Minimally fluctuating body weight,  current Body mass index is 39.51 kg/m. , no fatigue, no subjective hyperthermia, no subjective hypothermia Eyes: no blurry vision, no xerophthalmia ENT: no sore throat, no nodules palpated in throat, no dysphagia/odynophagia, no hoarseness Cardiovascular: no chest pain, no shortness of breath, no palpitations, no leg swelling Respiratory: no cough, no shortness of breath Gastrointestinal: no nausea/vomiting/diarrhea Musculoskeletal: c/o back pain- sees neurosurgeon for this  today Skin: no rashes, no hyperemia Neurological: no tremors, no numbness, no tingling, no dizziness Psychiatric: no depression, no anxiety   Objective:    BP (!) 174/84   Pulse 61   Ht _0  (1.575 m)   Wt 216 lb (98 kg)   BMI 39.51 kg/m   Wt Readings from Last 3 Encounters:  06/17/21 216 lb (98 kg)  02/12/21 217 lb (98.4 kg)  10/15/20 215 lb (97.5 kg)    BP Readings from Last 3 Encounters:  06/17/21 (!) 174/84  02/12/21 (!) 180/84  10/15/20 (!) 154/90     Physical Exam- Limited  Constitutional:  Body mass index is 39.51 kg/m. , not in acute distress, normal state of mind Eyes:  EOMI, no exophthalmos Neck: Supple Cardiovascular: RRR, no murmurs, rubs, or gallops, no edema Respiratory: Adequate breathing efforts, no crackles, rales, rhonchi, or wheezing Musculoskeletal: no gross deformities, strength intact in all four extremities, no gross restriction of joint movements Skin:  no rashes, no hyperemia Neurological: no tremor with outstretched hands    Results for orders placed or performed in visit on 06/17/21  HgB A1c  Result Value Ref Range   Hemoglobin A1C 8.4 (A) 4.0 - 5.6 %   HbA1c POC (<> result, manual entry)     HbA1c, POC (prediabetic range)     HbA1c, POC (controlled diabetic range)    POCT UA - Microalbumin  Result Value Ref Range   Microalbumin Ur, POC 10 mg/L   Creatinine, POC 10 mg/dL   Albumin/Creatinine Ratio, Urine, POC <30   Diabetic Labs (most recent): Lab Results  Component Value Date   HGBA1C 8.4 (A) 06/17/2021   HGBA1C 8.4 (A) 02/12/2021   HGBA1C 7.7 (A) 10/15/2020    Lipid Panel     Component Value Date/Time   CHOL 174 06/10/2021 0824   TRIG 182 (H) 06/10/2021 0824   HDL 41 06/10/2021 0824   CHOLHDL 4.2 06/10/2021 0824   LDLCALC 101 (H) 06/10/2021 0824   CMP Latest Ref Rng & Units 06/10/2021 02/06/2021 06/05/2020  Glucose 65 - 99 mg/dL 151(H) 80 91  BUN 8 - 27 mg/dL _1 Creatinine 0.76 - 1.27 mg/dL 1.10 1.03 1.06   Sodium 134 - 144 mmol/L 140 141 141  Potassium 3.5 - 5.2 mmol/L 4.8 4.4 4.6  Chloride 96 - 106 mmol/L 100 101 103  CO2 20 - 29 mmol/L _2 Calcium 8.6 - 10.2 mg/dL 10.0 9.6 9.3  Total Protein 6.0 - 8.5 g/dL 6.9 7.3 6.8  Total Bilirubin 0.0 - 1.2 mg/dL 0.3 0.3 <0.2  Alkaline Phos 44 - 121 IU/L 69 79 61  AST 0 - 40 IU/L _3 ALT 0 - 44 IU/L _4 Assessment & Plan:   1) Uncontrolled type 2 diabetes mellitus without complication, with long-term current  use of insulin ([place])  He presents today, accompanied by his wife, with his logs, no meter, showing stable glycemic profile with at target fasting and slightly above target postprandial readings.  His POCT A1c today is 8.4%, unchanged from last visit.  He is seeing a neurosurgeon today regarding his back, has 2 bulging discs with nerve pain to his leg.  He denies any hypoglycemia.   Glucose logs and insulin administration records pertaining to this visit,  to be scanned into patient's records.  Recent labs reviewed.  - Patient remains at a high risk for more acute and chronic complications of diabetes which include CAD, CVA, CKD, retinopathy, and neuropathy. These are all discussed in detail with the patient.  - Nutritional counseling repeated at each appointment due to patients tendency to fall back in to old habits.  - The patient admits there is a room for improvement in their diet and drink choices. -  Suggestion is made for the patient to avoid simple carbohydrates from their diet including Cakes, Sweet Desserts / Pastries, Ice Cream, Soda (diet and regular), Sweet Tea, Candies, Chips, Cookies, Sweet Pastries, Store Bought Juices, Alcohol in Excess of 1-2 drinks a day, Artificial Sweeteners, Coffee Creamer, and "Sugar-free" Products. This will help patient to have stable blood glucose profile and potentially avoid unintended weight gain.   - I encouraged the patient to switch to unprocessed or minimally processed complex  starch and increased protein intake (animal or plant source), fruits, and vegetables.   - Patient is advised to stick to a routine mealtimes to eat 3 meals a day and avoid unnecessary snacks (to snack only to correct hypoglycemia).  - I have approached patient with the following individualized plan to manage diabetes and patient agrees.  -He has responded very well to premixed insulin using Novolin 70/30.   -Given his prandial hyperglycemia, he will tolerate increase in his 70/30 to 20 units with breakfast and 15 units with supper.  He can continue Victoza 1.2 mg SQ daily and Metformin 1000 mg po twice daily with meals.    -He is encouraged to monitor glucose at least 3 times daily, before injecting insulin (at breakfast and supper) and before bed, and call the clinic if he has readings less than 70 or greater than 200 for 3 days out of the week.  - Patient specific target  for A1c; LDL, HDL, Triglycerides, and  Waist Circumference were discussed in detail.  2) BP/HTN:  His blood pressure is not controlled to target.  He had not long taken his medications prior to todays visit.  He is advised to continue Enalapril 20 mg po daily and Lasix 40 mg po daily.  He monitors his BP at home and says it is typically normal.    3) Lipids/HPL:  His most recent lipid panel from 06/10/21 shows uncontrolled LDL at 101 and elevated triglycerides of 182.  He is advised to increase his Pravastatin  to 40 mg po daily at bedtime (he may take 2 of his 20 mg tabs until he depletes his supply).  Side effects and precautions discussed with patient.    4)  Weight/Diet:  His Body mass index is 39.51 kg/m.--clearly complicating his diabetes care.  He is a candidate for modest weight loss.  CDE consult in progress, exercise, and carbohydrates information provided.  5) Chronic Care/Health Maintenance: -Patient is on ACEI and Statin medications and encouraged to continue to follow up with Ophthalmology, Podiatrist at  least yearly or according to recommendations,  and advised to stay away from smoking. I have recommended yearly flu vaccine and pneumonia vaccination at least every 5 years; moderate intensity exercise for up to 150 minutes weekly; and  sleep for at least 7 hours a day.  6) Vitamin D deficiency -His recent vitamin D level was 31 on 06/10/21.  He had stopped taking his vitamin D supplement recently , therefore he is advised to restart OTC Vitamin D3 5000 units daily as maintenance dose.   I advised patient to maintain close follow up with his PCP for primary care needs.    I spent 30 minutes in the care of the patient today including review of labs from [place], Lipids, Thyroid Function, Hematology (current and previous including abstractions from other facilities); face-to-face time discussing  his blood glucose readings/logs, discussing hypoglycemia and hyperglycemia episodes and symptoms, medications doses, his options of short and long term treatment based on the latest standards of care / guidelines;  discussion about incorporating lifestyle medicine;  and documenting the encounter.    Please refer to Patient Instructions for Blood Glucose Monitoring and Insulin/Medications Dosing Guide"  in media tab for additional information. Please  also refer to " Patient Self Inventory" in the Media  tab for reviewed elements of pertinent patient history.  Patrick Taylor participated in the discussions, expressed understanding, and voiced agreement with the above plans.  All questions were answered to his satisfaction. he is encouraged to contact clinic should he have any questions or concerns prior to his return visit.    Follow up plan: Return in about 4 months (around 10/18/2021) for Diabetes F/U with A1c in office, No previsit labs, Bring meter and logs.  Rayetta Pigg, [facility] [address] [place], [place] [address] Phone: [phone] Fax:  [phone]  06/17/2021, 8:42 AM

## 2021-06-17 NOTE — Patient Instructions (Signed)

## 2021-06-22 ENCOUNTER — Encounter (HOSPITAL_COMMUNITY): Admission: RE | Admit: 2021-06-22 | Payer: Medicare Other | Source: Ambulatory Visit

## 2021-06-24 ENCOUNTER — Other Ambulatory Visit (HOSPITAL_COMMUNITY): Payer: Medicare Other

## 2021-07-17 ENCOUNTER — Encounter: Payer: Self-pay | Admitting: Gastroenterology

## 2021-07-17 ENCOUNTER — Other Ambulatory Visit: Payer: Self-pay

## 2021-07-17 ENCOUNTER — Ambulatory Visit (INDEPENDENT_AMBULATORY_CARE_PROVIDER_SITE_OTHER): Payer: Medicare Other | Admitting: Gastroenterology

## 2021-07-17 DIAGNOSIS — Z86010 Personal history of colonic polyps: Secondary | ICD-10-CM

## 2021-07-17 DIAGNOSIS — D649 Anemia, unspecified: Secondary | ICD-10-CM

## 2021-07-17 DIAGNOSIS — Z860101 Personal history of adenomatous and serrated colon polyps: Secondary | ICD-10-CM | POA: Insufficient documentation

## 2021-07-17 NOTE — Progress Notes (Signed)
Primary Care Physician:  Ginger Organ  Primary Gastroenterologist:  Garfield Cornea, MD   Chief Complaint  Patient presents with   Anemia    HPI:  Patrick Taylor is a 70 y.o. male here at request of Collene Mares, PA-C for history of anemia, previous polyps, need for colonoscopy.  Lab from 05/06/2021: Hemoglobin 12.9 (normal 13-17.7), hematocrit 40.7, MCV 82, white blood cell count 6600, platelets 340,000, BUN 21, creatinine 1.12, albumin 4.9, total bilirubin 0.3, alk phos 66, AST 23, ALT 7, PSA 0.4, TSH 2.970.  Patient's last colonoscopy was in 2013, he had multiple tubular adenomas removed, prep was poor.  He had moviprep at that time.  He was advised to come back in 3 years.  He has a prior history of tubulovillous adenoma removed back in 2010.  Patient states his bowel movements have been normal.No constipation. No melena, brbpr. No abdominal pain. No heartburn. On celebrex for back pain and arthritis. Hydrocodone twice per day. Takes tylenol at night.  No dysphagia, vomiting, unintentional weight loss     Current Outpatient Medications  Medication Sig Dispense Refill   albuterol (VENTOLIN HFA) 108 (90 Base) MCG/ACT inhaler Inhale 1-2 puffs into the lungs every 6 (six) hours as needed.     aspirin 81 MG tablet Take 81 mg by mouth once a week.     celecoxib (CELEBREX) 200 MG capsule Take 200 mg by mouth 2 (two) times daily as needed.     cyclobenzaprine (FLEXERIL) 10 MG tablet Take 10 mg by mouth every 8 (eight) hours as needed for muscle spasms.     enalapril (VASOTEC) 20 MG tablet Take 1 tablet (20 mg total) by mouth daily. 90 tablet 1   furosemide (LASIX) 20 MG tablet Take 20 mg by mouth daily.      gabapentin (NEURONTIN) 300 MG capsule 5 CAPS daily     HYDROcodone-acetaminophen (NORCO/VICODIN) 5-325 MG tablet Take 1 tablet by mouth 2 (two) times daily as needed for moderate pain.     insulin aspart protamine - aspart (NOVOLOG 70/30 MIX) (70-30) 100 UNIT/ML FlexPen  Inject 0.15-0.2 mLs (15-20 Units total) into the skin 2 (two) times daily with a meal. 24 mL 3   meclizine (ANTIVERT) 25 MG tablet Take 25 mg by mouth 3 (three) times daily as needed.     metFORMIN (GLUCOPHAGE) 500 MG tablet Take 2 tablets (1,000 mg total) by mouth 2 (two) times daily with a meal. Patient taking 2 525m tabs at breakfast and supper, 1 5062mtab at lunch (Patient taking differently: 2 tabs with breakfast, 1 tablet at lunch, 2 at supper) 360 tablet 3   pravastatin (PRAVACHOL) 40 MG tablet Take 1 tablet (40 mg total) by mouth daily. 90 tablet 3   terbinafine (LAMISIL) 250 MG tablet Take 250 mg by mouth daily.     Blood Glucose Monitoring Suppl (ACCU-CHEK AVIVA PLUS) w/Device KIT Use as directed bid E11.65 1 kit 0   glucose blood (ACCU-CHEK AVIVA PLUS) test strip USE 1 STRIP TO CHECK GLUCOSE 2 TO 3 TIMES DAILY 100 each 2   Insulin Pen Needle (B-D UF III MINI PEN NEEDLES) 31G X 5 MM MISC USE FOR INSULIN INJECTIONS 2 TIMES A DAY 100 each 5   No current facility-administered medications for this visit.    Allergies as of 07/17/2021   (No Known Allergies)    Past Medical History:  Diagnosis Date   Anemia    Asthma    Carpal tunnel syndrome  Chronic back pain    Colon polyps    COPD (chronic obstructive pulmonary disease) ([facility])    DM (diabetes mellitus) ([place])    HTN (hypertension)    Hyperlipidemia     Past Surgical History:  Procedure Laterality Date   ABDOMINAL HERNIA REPAIR     COLONOSCOPY  01/24/09   rectal polyp/adenomatous poylps/tubulovillous   COLONOSCOPY  03/02/2012   Procedure: COLONOSCOPY;  Surgeon: Daneil Dolin, MD;  Location: [facility];  Service: Endoscopy;  Laterality: N/A;  11:00    Family History  Problem Relation Age of Onset   Colon cancer Other     Social History   Socioeconomic History   Marital status: Married    Spouse name: Not on file   Number of children: Not on file   Years of education: Not on file   Highest education level:  Not on file  Occupational History   Occupation: works at [employer] Use   Smoking status: Former    Types: Cigarettes    Quit date: 02/18/1999    Years since quitting: 22.4   Smokeless tobacco: Never  Vaping Use   Vaping Use: Never used  Substance and Sexual Activity   Alcohol use: No    Comment: no heavy use in past   Drug use: No   Sexual activity: Not on file  Other Topics Concern   Not on file  Social History Narrative   Not on file   Social Determinants of Health   Financial Resource Strain: Not on file  Food Insecurity: Not on file  Transportation Needs: Not on file  Physical Activity: Not on file  Stress: Not on file  Social Connections: Not on file  Intimate Partner Violence: Not on file      ROS:  General: Negative for anorexia, weight loss, fever, chills, fatigue, weakness. Eyes: Negative for vision changes.  ENT: Negative for hoarseness, difficulty swallowing , nasal congestion. CV: Negative for chest pain, angina, palpitations, dyspnea on exertion, peripheral edema.  Respiratory: Negative for dyspnea at rest, dyspnea on exertion, cough, sputum, wheezing.  GI: See history of present illness. GU:  Negative for dysuria, hematuria, urinary incontinence, urinary frequency, nocturnal urination.  MS: Negative for joint pain. chronic low back pain.  Derm: Negative for rash or itching.  Neuro: Negative for weakness, abnormal sensation, seizure, frequent headaches, memory loss, confusion.  Psych: Negative for anxiety, depression, suicidal ideation, hallucinations.  Endo: Negative for unusual weight change.  Heme: Negative for bruising or bleeding. Allergy: Negative for rash or hives.    Physical Examination:  BP (!) 147/66   Pulse 80   Temp 97.7 F (36.5 C)   Ht $R'5\' 2"'OU$  (1.575 m)   Wt 217 lb 3.2 oz (98.5 kg)   BMI 39.73 kg/m    General: Well-nourished, well-developed in no acute distress.  Head: Normocephalic, atraumatic.   Eyes: Conjunctiva pink,  no icterus. Mouth: masked Neck: Supple without thyromegaly, masses, or lymphadenopathy.  Lungs: Clear to auscultation bilaterally.  Heart: Regular rate and rhythm, no murmurs rubs or gallops.  Abdomen: Bowel sounds are normal, nontender, nondistended, no hepatosplenomegaly or masses, no abdominal bruits or    hernia , no rebound or guarding.   Rectal: not performed Extremities: No lower extremity edema. No clubbing or deformities.  Neuro: Alert and oriented x 4 , grossly normal neurologically.  Skin: Warm and dry, no rash or jaundice.   Psych: Alert and cooperative, normal mood and affect.  Labs: Lab Results  Component  Value Date   CREATININE 1.10 06/10/2021   BUN 19 06/10/2021   NA 140 06/10/2021   K 4.8 06/10/2021   CL 100 06/10/2021   CO2 24 06/10/2021   Lab Results  Component Value Date   ALT 8 06/10/2021   AST 21 06/10/2021   ALKPHOS 69 06/10/2021   BILITOT 0.3 06/10/2021   Lab Results  Component Value Date   WBC 8.8 02/06/2021   HGB 12.9 (L) 02/06/2021   HCT 39.7 02/06/2021   MCV 82 02/06/2021   PLT 367 02/06/2021   Lab Results  Component Value Date   HGBA1C 8.4 (A) 06/17/2021     Imaging Studies: No results found.   Assessment:  Pleasant 70 year old male with history of tubulovillous adenoma in 2010, multiple tubular adenomas removed in 2013 with poor bowel prep and advised to have 3-year follow-up colonoscopy, presenting for further evaluation of mild normocytic anemia and update colonoscopy.  Patient denies any GI symptoms.  Hemoglobin was 12.9 just slightly low, hematocrit normal.  He takes Celebrex for chronic pain.  Hemoccult status unknown.  At this point would recommend updating colonoscopy as he is overdue for history of polyps and poor bowel prep.   Plan: Colonoscopy in the near future with Dr. Gala Romney with propofol. ASA III.  I have discussed the risks, alternatives, benefits with regards to but not limited to the risk of reaction to medication,  bleeding, infection, perforation and the patient is agreeable to proceed. Written consent to be obtained.

## 2021-07-17 NOTE — Patient Instructions (Signed)
Colonoscopy to be scheduled. See separate instructions.  ?

## 2021-07-20 ENCOUNTER — Encounter: Payer: Self-pay | Admitting: *Deleted

## 2021-07-20 ENCOUNTER — Telehealth: Payer: Self-pay | Admitting: *Deleted

## 2021-07-20 DIAGNOSIS — G894 Chronic pain syndrome: Secondary | ICD-10-CM | POA: Diagnosis not present

## 2021-07-20 DIAGNOSIS — M1991 Primary osteoarthritis, unspecified site: Secondary | ICD-10-CM | POA: Diagnosis not present

## 2021-07-20 DIAGNOSIS — I10 Essential (primary) hypertension: Secondary | ICD-10-CM | POA: Diagnosis not present

## 2021-07-20 DIAGNOSIS — E1140 Type 2 diabetes mellitus with diabetic neuropathy, unspecified: Secondary | ICD-10-CM | POA: Diagnosis not present

## 2021-07-20 MED ORDER — PEG 3350-KCL-NA BICARB-NACL 420 G PO SOLR: ORAL | 0 refills | Status: DC

## 2021-07-20 NOTE — Telephone Encounter (Signed)
Called pt. He has been scheduled for TCS with propofol Dr Gala Romney, asa 3 on 9/29 at 8:45am. Aware will mail prep instructions and send rx to pharmacy. Confirmed address, confirmed pharmacy. Pt aware according to notes needs large volume prep d/t h/o poor prep. Dulcolax qd 3 DAYS BEFORE PREP STARTS   PA approved via [facility] website. Auth# [id number], DOS Sep 10, 2021 - Dec 09, 2021

## 2021-08-07 ENCOUNTER — Other Ambulatory Visit: Payer: Self-pay | Admitting: "Endocrinology

## 2021-08-07 ENCOUNTER — Other Ambulatory Visit: Payer: Self-pay

## 2021-08-07 MED ORDER — ACCU-CHEK AVIVA PLUS VI STRP: ORAL_STRIP | 5 refills | Status: DC

## 2021-08-21 DIAGNOSIS — M5412 Radiculopathy, cervical region: Secondary | ICD-10-CM | POA: Diagnosis not present

## 2021-08-21 DIAGNOSIS — G894 Chronic pain syndrome: Secondary | ICD-10-CM | POA: Diagnosis not present

## 2021-08-21 DIAGNOSIS — E1140 Type 2 diabetes mellitus with diabetic neuropathy, unspecified: Secondary | ICD-10-CM | POA: Diagnosis not present

## 2021-08-27 ENCOUNTER — Other Ambulatory Visit: Payer: Self-pay

## 2021-08-27 DIAGNOSIS — E1165 Type 2 diabetes mellitus with hyperglycemia: Secondary | ICD-10-CM

## 2021-08-27 MED ORDER — ACCU-CHEK AVIVA PLUS VI STRP: ORAL_STRIP | 5 refills | Status: DC

## 2021-09-04 NOTE — Patient Instructions (Signed)
Patrick Taylor  09/04/2021     @PREFPERIOPPHARMACY @   Your procedure is scheduled on  09/10/2021.   Report to [facility] at  [place]    Call this number if you have problems the morning of surgery:  [phone]   Remember:  Follow the diet and prep instructions given to you by the office.      DO NOT take any medications for diabetes the morning of your procedure.       Use your inhaler before you come and bring your rescue inhaler with you.    Take these medicines the morning of surgery with A SIP OF WATER        celebrex, flexeril, gabapentin, hydrocodone(if needed), antivert (if needed).    Do not wear jewelry, make-up or nail polish.  Do not wear lotions, powders, or perfumes, or deodorant.  Do not shave 48 hours prior to surgery.  Men may shave face and neck.  Do not bring valuables to the hospital.  [facility] is not responsible for any belongings or valuables.  Contacts, dentures or bridgework may not be worn into surgery.  Leave your suitcase in the car.  After surgery it may be brought to your room.  For patients admitted to the hospital, discharge time will be determined by your treatment team.  Patients discharged the day of surgery will not be allowed to drive home and must have someone with them for 24 hours.    Special instructions:  DO NOT smoke tobacco or vape for 24 hours before your procedure.  Please read over the following fact sheets that you were given. Anesthesia Post-op Instructions and Care and Recovery After Surgery      Colonoscopy, Adult, Care After This sheet gives you information about how to care for yourself after your procedure. Your health care provider may also give you more specific instructions. If you have problems or questions, contact your health care provider. What can I expect after the procedure? After the procedure, it is common to have: A small amount of blood in your stool for 24 hours after the  procedure. Some gas. Mild cramping or bloating of your abdomen. Follow these instructions at home: Eating and drinking  Drink enough fluid to keep your urine pale yellow. Follow instructions from your health care provider about eating or drinking restrictions. Resume your normal diet as instructed by your health care provider. Avoid heavy or fried foods that are hard to digest. Activity Rest as told by your health care provider. Avoid sitting for a long time without moving. Get up to take short walks every 1-2 hours. This is important to improve blood flow and breathing. Ask for help if you feel weak or unsteady. Return to your normal activities as told by your health care provider. Ask your health care provider what activities are safe for you. Managing cramping and bloating  Try walking around when you have cramps or feel bloated. Apply heat to your abdomen as told by your health care provider. Use the heat source that your health care provider recommends, such as a moist heat pack or a heating pad. Place a towel between your skin and the heat source. Leave the heat on for 20-30 minutes. Remove the heat if your skin turns bright red. This is especially important if you are unable to feel pain, heat, or cold. You may have a greater risk of getting burned. General instructions If you were given  a sedative during the procedure, it can affect you for several hours. Do not drive or operate machinery until your health care provider says that it is safe. For the first 24 hours after the procedure: Do not sign important documents. Do not drink alcohol. Do your regular daily activities at a slower pace than normal. Eat soft foods that are easy to digest. Take over-the-counter and prescription medicines only as told by your health care provider. Keep all follow-up visits as told by your health care provider. This is important. Contact a health care provider if: You have blood in your stool 2-3  days after the procedure. Get help right away if you have: More than a small spotting of blood in your stool. Large blood clots in your stool. Swelling of your abdomen. Nausea or vomiting. A fever. Increasing pain in your abdomen that is not relieved with medicine. Summary After the procedure, it is common to have a small amount of blood in your stool. You may also have mild cramping and bloating of your abdomen. If you were given a sedative during the procedure, it can affect you for several hours. Do not drive or operate machinery until your health care provider says that it is safe. Get help right away if you have a lot of blood in your stool, nausea or vomiting, a fever, or increased pain in your abdomen. This information is not intended to replace advice given to you by your health care provider. Make sure you discuss any questions you have with your health care provider. Document Revised: 11/23/2019 Document Reviewed: 06/25/2019 Elsevier Patient Education  [employer] After This sheet gives you information about how to care for yourself after your procedure. Your health care provider may also give you more specific instructions. If you have problems or questions, contact your health care provider. What can I expect after the procedure? After the procedure, it is common to have: Tiredness. Forgetfulness about what happened after the procedure. Impaired judgment for important decisions. Nausea or vomiting. Some difficulty with balance. Follow these instructions at home: For the time period you were told by your health care provider:   Rest as needed. Do not participate in activities where you could fall or become injured. Do not drive or use machinery. Do not drink alcohol. Do not take sleeping pills or medicines that cause drowsiness. Do not make important decisions or sign legal documents. Do not take care of children on your own. Eating  and drinking Follow the diet that is recommended by your health care provider. Drink enough fluid to keep your urine pale yellow. If you vomit: Drink water, juice, or soup when you can drink without vomiting. Make sure you have little or no nausea before eating solid foods. General instructions Have a responsible adult stay with you for the time you are told. It is important to have someone help care for you until you are awake and alert. Take over-the-counter and prescription medicines only as told by your health care provider. If you have sleep apnea, surgery and certain medicines can increase your risk for breathing problems. Follow instructions from your health care provider about wearing your sleep device: Anytime you are sleeping, including during daytime naps. While taking prescription pain medicines, sleeping medicines, or medicines that make you drowsy. Avoid smoking. Keep all follow-up visits as told by your health care provider. This is important. Contact a health care provider if: You keep feeling nauseous or you keep vomiting.  You feel light-headed. You are still sleepy or having trouble with balance after 24 hours. You develop a rash. You have a fever. You have redness or swelling around the IV site. Get help right away if: You have trouble breathing. You have new-onset confusion at home. Summary For several hours after your procedure, you may feel tired. You may also be forgetful and have poor judgment. Have a responsible adult stay with you for the time you are told. It is important to have someone help care for you until you are awake and alert. Rest as told. Do not drive or operate machinery. Do not drink alcohol or take sleeping pills. Get help right away if you have trouble breathing, or if you suddenly become confused. This information is not intended to replace advice given to you by your health care provider. Make sure you discuss any questions you have with your  health care provider. Document Revised: 08/14/2020 Document Reviewed: 11/01/2019 Elsevier Patient Education  2022 [employer].

## 2021-09-08 ENCOUNTER — Other Ambulatory Visit: Payer: Self-pay

## 2021-09-08 ENCOUNTER — Encounter (HOSPITAL_COMMUNITY)
Admission: RE | Admit: 2021-09-08 | Discharge: 2021-09-08 | Disposition: A | Discharge status: Home / Self Care | Payer: Medicare Other | Source: Ambulatory Visit | Attending: Internal Medicine | Admitting: Internal Medicine

## 2021-09-08 ENCOUNTER — Encounter (HOSPITAL_COMMUNITY): Payer: Self-pay

## 2021-09-08 DIAGNOSIS — Z01818 Encounter for other preprocedural examination: Secondary | ICD-10-CM | POA: Insufficient documentation

## 2021-09-08 LAB — CBC WITH DIFFERENTIAL/PLATELET
Abs Immature Granulocytes: 0.01 10*3/uL (ref 0.00–0.07)
Basophils Absolute: 0.1 10*3/uL (ref 0.0–0.1)
Basophils Relative: 1 %
Eosinophils Absolute: 0.5 10*3/uL (ref 0.0–0.5)
Eosinophils Relative: 7 %
HCT: 39.5 % (ref 39.0–52.0)
Hemoglobin: 12.3 g/dL — ABNORMAL LOW (ref 13.0–17.0)
Immature Granulocytes: 0 %
Lymphocytes Relative: 41 %
Lymphs Abs: 2.7 10*3/uL (ref 0.7–4.0)
MCH: 27.3 pg (ref 26.0–34.0)
MCHC: 31.1 g/dL (ref 30.0–36.0)
MCV: 87.8 fL (ref 80.0–100.0)
Monocytes Absolute: 0.5 10*3/uL (ref 0.1–1.0)
Monocytes Relative: 8 %
Neutro Abs: 2.8 10*3/uL (ref 1.7–7.7)
Neutrophils Relative %: 43 %
Platelets: 353 10*3/uL (ref 150–400)
RBC: 4.5 MIL/uL (ref 4.22–5.81)
RDW: 13.1 % (ref 11.5–15.5)
WBC: 6.6 10*3/uL (ref 4.0–10.5)
nRBC: 0 % (ref 0.0–0.2)

## 2021-09-08 LAB — BASIC METABOLIC PANEL
Anion gap: 7 (ref 5–15)
BUN: 18 mg/dL (ref 8–23)
CO2: 29 mmol/L (ref 22–32)
Calcium: 9.7 mg/dL (ref 8.9–10.3)
Chloride: 102 mmol/L (ref 98–111)
Creatinine, Ser: 0.97 mg/dL (ref 0.61–1.24)
GFR, Estimated: >60 mL/min (ref >60)
Glucose, Bld: 147 mg/dL — ABNORMAL HIGH (ref 70–99)
Potassium: 4.2 mmol/L (ref 3.5–5.1)
Sodium: 138 mmol/L (ref 135–145)

## 2021-09-10 ENCOUNTER — Ambulatory Visit (HOSPITAL_COMMUNITY): Payer: Medicare Other | Admitting: Anesthesiology

## 2021-09-10 ENCOUNTER — Encounter (HOSPITAL_COMMUNITY)
Admission: RE | Disposition: A | Discharge status: Home / Self Care | Payer: Self-pay | Source: Home / Self Care | Attending: Internal Medicine

## 2021-09-10 ENCOUNTER — Other Ambulatory Visit: Payer: Self-pay

## 2021-09-10 ENCOUNTER — Encounter (HOSPITAL_COMMUNITY): Payer: Self-pay | Admitting: Internal Medicine

## 2021-09-10 ENCOUNTER — Ambulatory Visit (HOSPITAL_COMMUNITY)
Admission: RE | Admit: 2021-09-10 | Discharge: 2021-09-10 | Disposition: A | Discharge status: Home / Self Care | Payer: Medicare Other | Attending: Internal Medicine | Admitting: Internal Medicine

## 2021-09-10 DIAGNOSIS — E785 Hyperlipidemia, unspecified: Secondary | ICD-10-CM | POA: Diagnosis not present

## 2021-09-10 DIAGNOSIS — Z87891 Personal history of nicotine dependence: Secondary | ICD-10-CM | POA: Diagnosis not present

## 2021-09-10 DIAGNOSIS — Z1211 Encounter for screening for malignant neoplasm of colon: Secondary | ICD-10-CM | POA: Diagnosis not present

## 2021-09-10 DIAGNOSIS — D124 Benign neoplasm of descending colon: Secondary | ICD-10-CM | POA: Insufficient documentation

## 2021-09-10 DIAGNOSIS — Z7982 Long term (current) use of aspirin: Secondary | ICD-10-CM | POA: Diagnosis not present

## 2021-09-10 DIAGNOSIS — Z86010 Personal history of colonic polyps: Secondary | ICD-10-CM | POA: Diagnosis not present

## 2021-09-10 DIAGNOSIS — K635 Polyp of colon: Secondary | ICD-10-CM

## 2021-09-10 DIAGNOSIS — D123 Benign neoplasm of transverse colon: Secondary | ICD-10-CM | POA: Insufficient documentation

## 2021-09-10 DIAGNOSIS — Z7984 Long term (current) use of oral hypoglycemic drugs: Secondary | ICD-10-CM | POA: Diagnosis not present

## 2021-09-10 DIAGNOSIS — Z79899 Other long term (current) drug therapy: Secondary | ICD-10-CM | POA: Insufficient documentation

## 2021-09-10 DIAGNOSIS — I10 Essential (primary) hypertension: Secondary | ICD-10-CM | POA: Insufficient documentation

## 2021-09-10 DIAGNOSIS — D649 Anemia, unspecified: Secondary | ICD-10-CM | POA: Diagnosis not present

## 2021-09-10 DIAGNOSIS — Z791 Long term (current) use of non-steroidal anti-inflammatories (NSAID): Secondary | ICD-10-CM | POA: Insufficient documentation

## 2021-09-10 DIAGNOSIS — J449 Chronic obstructive pulmonary disease, unspecified: Secondary | ICD-10-CM | POA: Insufficient documentation

## 2021-09-10 DIAGNOSIS — E119 Type 2 diabetes mellitus without complications: Secondary | ICD-10-CM | POA: Insufficient documentation

## 2021-09-10 DIAGNOSIS — Z794 Long term (current) use of insulin: Secondary | ICD-10-CM | POA: Diagnosis not present

## 2021-09-10 HISTORY — PX: POLYPECTOMY: SHX5525

## 2021-09-10 HISTORY — PX: COLONOSCOPY WITH PROPOFOL: SHX5780

## 2021-09-10 LAB — GLUCOSE, CAPILLARY: Glucose-Capillary: 138 mg/dL — ABNORMAL HIGH (ref 70–99)

## 2021-09-10 SURGERY — COLONOSCOPY WITH PROPOFOL
Anesthesia: General

## 2021-09-10 MED ORDER — PROPOFOL 10 MG/ML IV BOLUS
INTRAVENOUS | Status: DC | PRN
  Administered 2021-09-10: 100 mg via INTRAVENOUS
  Administered 2021-09-10: 20 mg via INTRAVENOUS

## 2021-09-10 MED ORDER — LIDOCAINE HCL (CARDIAC) PF 50 MG/5ML IV SOSY
PREFILLED_SYRINGE | INTRAVENOUS | Status: DC | PRN
  Administered 2021-09-10: 50 mg via INTRAVENOUS

## 2021-09-10 MED ORDER — PROPOFOL 500 MG/50ML IV EMUL
INTRAVENOUS | Status: DC | PRN
  Administered 2021-09-10: 75 ug/kg/min via INTRAVENOUS

## 2021-09-10 MED ORDER — LACTATED RINGERS IV SOLN: INTRAVENOUS | Status: DC

## 2021-09-10 NOTE — Transfer of Care (Signed)
Immediate Anesthesia Transfer of Care Note  Patient: Patrick Taylor  Procedure(s) Performed: COLONOSCOPY WITH PROPOFOL POLYPECTOMY  Patient Location: Short Stay  Anesthesia Type:General  Level of Consciousness: awake and patient cooperative  Airway & Oxygen Therapy: Patient Spontanous Breathing  Post-op Assessment: Report given to RN and Post -op Vital signs reviewed and stable  Post vital signs: Reviewed and stable  Last Vitals:  Vitals Value Taken Time  BP    Temp    Pulse    Resp    SpO2     SEE VITAL SIGN FLOW SHEET Last Pain:  Vitals:   09/10/21 0850  TempSrc:   PainSc: 0-No pain         Complications: No notable events documented.

## 2021-09-10 NOTE — Anesthesia Postprocedure Evaluation (Signed)
Anesthesia Post Note  Patient: Patrick Taylor  Procedure(s) Performed: COLONOSCOPY WITH PROPOFOL POLYPECTOMY  Patient location during evaluation: Phase II Anesthesia Type: General Level of consciousness: awake and alert and oriented Pain management: pain level controlled Vital Signs Assessment: post-procedure vital signs reviewed and stable Respiratory status: spontaneous breathing and respiratory function stable Cardiovascular status: blood pressure returned to baseline and stable Postop Assessment: no apparent nausea or vomiting Anesthetic complications: no   No notable events documented.   Last Vitals:  Vitals:   09/10/21 0730 09/10/21 0918  BP: (!) 174/76 (!) 125/57  Pulse:  60  Resp:  16  Temp:  36.8 C  SpO2:  100%    Last Pain:  Vitals:   09/10/21 0918  TempSrc: Oral  PainSc: 0-No pain                 Rahkim Rabalais C Ryann Pauli

## 2021-09-10 NOTE — Anesthesia Preprocedure Evaluation (Signed)
Anesthesia Evaluation  Patient identified by MRN, date of birth, ID band Patient awake    Reviewed: Allergy & Precautions, NPO status , Patient's Chart, lab work & pertinent test results  Airway Mallampati: II  TM Distance: >3 FB Neck ROM: Full    Dental  (+) Dental Advisory Given, Missing, Edentulous Upper   Pulmonary shortness of breath and with exertion, asthma , COPD,  COPD inhaler, former smoker,    Pulmonary exam normal breath sounds clear to auscultation       Cardiovascular Exercise Tolerance: Poor hypertension, Pt. on medications Normal cardiovascular exam Rhythm:Regular Rate:Normal     Neuro/Psych  Neuromuscular disease negative psych ROS   GI/Hepatic negative GI ROS, Neg liver ROS,   Endo/Other  diabetes, Poorly Controlled, Type 2, Oral Hypoglycemic Agents, Insulin Dependent  Renal/GU negative Renal ROS     Musculoskeletal negative musculoskeletal ROS (+)   Abdominal   Peds  Hematology  (+) anemia ,   Anesthesia Other Findings   Reproductive/Obstetrics negative OB ROS                            Anesthesia Physical Anesthesia Plan  ASA: 3  Anesthesia Plan: General   Post-op Pain Management:    Induction: Intravenous  PONV Risk Score and Plan: Propofol infusion  Airway Management Planned: Nasal Cannula, Natural Airway and Simple Face Mask  Additional Equipment:   Intra-op Plan:   Post-operative Plan:   Informed Consent: I have reviewed the patients History and Physical, chart, labs and discussed the procedure including the risks, benefits and alternatives for the proposed anesthesia with the patient or authorized representative who has indicated his/her understanding and acceptance.     Dental advisory given  Plan Discussed with: CRNA and Surgeon  Anesthesia Plan Comments:        Anesthesia Quick Evaluation

## 2021-09-10 NOTE — Op Note (Signed)
[facility] Patient Name: Patrick Taylor Procedure Date: 09/10/2021 8:31 AM MRN: [id number] Date of Birth: 03-09-1951 Attending MD: Norvel Richards , MD CSN: [id number] Age: 70 Admit Type: Outpatient Procedure:                Colonoscopy Indications:              High risk colon cancer surveillance: Personal                            history of colonic polyps Providers:                Norvel Richards, MD, Janeece Riggers, RN, Raphael Gibney, Technician Referring MD:              Medicines:                Propofol per Anesthesia Complications:            No immediate complications. Estimated Blood Loss:     Estimated blood loss: none. Procedure:                Pre-Anesthesia Assessment:                           - Prior to the procedure, a History and Physical                            was performed, and patient medications and                            allergies were reviewed. The patient's tolerance of                            previous anesthesia was also reviewed. The risks                            and benefits of the procedure and the sedation                            options and risks were discussed with the patient.                            All questions were answered, and informed consent                            was obtained. Prior Anticoagulants: The patient has                            taken no previous anticoagulant or antiplatelet                            agents. ASA Grade Assessment: III - A patient with  severe systemic disease. After reviewing the risks                            and benefits, the patient was deemed in                            satisfactory condition to undergo the procedure.                           After obtaining informed consent, the colonoscope                            was passed under direct vision. Throughout the                            procedure, the  patient's blood pressure, pulse, and                            oxygen saturations were monitored continuously. The                            [id number]) scope was introduced through the                            anus and advanced to the the cecum, identified by                            appendiceal orifice and ileocecal valve. The                            colonoscopy was performed without difficulty. The                            patient tolerated the procedure well. The quality                            of the bowel preparation was adequate. Scope In: 8:52:27 AM Scope Out: 9:10:06 AM Scope Withdrawal Time: 0 hours 12 minutes 49 seconds  Total Procedure Duration: 0 hours 17 minutes 39 seconds  Findings:      The perianal and digital rectal examinations were normal.      Two sessile polyps were found in the descending colon and hepatic       flexure. The polyps were 4 to 6 mm in size. These polyps were removed       with a cold snare. Resection and retrieval were complete. Estimated       blood loss was minimal.      The entire examined colon appeared normal on direct and retroflexion       views. Impression:               - Two 4 to 6 mm polyps in the descending colon and                            at the hepatic flexure, removed with a cold snare.  Resected and retrieved.                           - The entire examined colon is normal on direct and                            retroflexion views. Moderate Sedation:      Moderate (conscious) sedation was personally administered by an       anesthesia professional. The following parameters were monitored: oxygen       saturation, heart rate, blood pressure, respiratory rate, EKG, adequacy       of pulmonary ventilation, and response to care. Recommendation:           - Patient has a contact number available for                            emergencies. The signs and symptoms of potential                             delayed complications were discussed with the                            patient. Return to normal activities tomorrow.                            Written discharge instructions were provided to the                            patient.                           - Resume previous diet.                           - Await pathology results.                           - Repeat colonoscopy date to be determined after                            pending pathology results are reviewed for                            surveillance.                           - Return to GI office after studies are complete. Procedure Code(s):        --- Professional ---                           [phone], Colonoscopy, flexible; with removal of                            tumor(s), polyp(s), or other lesion(s) by snare  technique Diagnosis Code(s):        --- Professional ---                           Z86.010, Personal history of colonic polyps                           K63.5, Polyp of colon CPT copyright 2019 American Medical Association. All rights reserved. The codes documented in this report are preliminary and upon coder review may  be revised to meet current compliance requirements. Cristopher Estimable. Toshiyuki Fredell, MD Norvel Richards, MD 09/10/2021 9:17:47 AM This report has been signed electronically. Number of Addenda: 0

## 2021-09-10 NOTE — H&P (Signed)
_0 @   Primary Care Physician:  Ginger Organ Primary Gastroenterologist:  Dr. Gala Romney  Pre-Procedure History & Physical: HPI:  Patrick Taylor is a 70 y.o. male here for Surveillance colonoscopy.  Had advanced adenoma removed 2010;  poor prep 2013-multiple adenomas removed at that time.  Did not return for early interval follow-up as recommended.  Past Medical History:  Diagnosis Date   Anemia    Asthma    Carpal tunnel syndrome    Chronic back pain    Colon polyps    COPD (chronic obstructive pulmonary disease) (HCC)    DM (diabetes mellitus) ([place])    HTN (hypertension)    Hyperlipidemia     Past Surgical History:  Procedure Laterality Date   ABDOMINAL HERNIA REPAIR     COLONOSCOPY  01/24/09   rectal polyp/adenomatous poylps/tubulovillous   COLONOSCOPY  03/02/2012   Procedure: COLONOSCOPY;  Surgeon: Daneil Dolin, MD;  Location: [facility];  Service: Endoscopy;  Laterality: N/A;  11:00    Prior to Admission medications   Medication Sig Start Date End Date Taking? Authorizing Provider  albuterol (VENTOLIN HFA) 108 (90 Base) MCG/ACT inhaler Inhale 1-2 puffs into the lungs every 6 (six) hours as needed. 01/01/20  Yes [provider]  aspirin 81 MG tablet Take 81 mg by mouth once a week.   Yes [provider]  calcium-vitamin D (OSCAL WITH D) 500-200 MG-UNIT tablet Take 1 tablet by mouth daily.   Yes [provider]  celecoxib (CELEBREX) 200 MG capsule Take 200 mg by mouth 2 (two) times daily as needed. 06/25/21  Yes [provider]  cyclobenzaprine (FLEXERIL) 10 MG tablet Take 10 mg by mouth every 8 (eight) hours as needed for muscle spasms.   Yes [provider]  enalapril (VASOTEC) 20 MG tablet Take 1 tablet (20 mg total) by mouth daily. 12/24/15  Yes Nida, Marella Chimes, MD  furosemide (LASIX) 20 MG tablet Take 20 mg by mouth daily.  01/22/12  Yes [provider]  gabapentin (NEURONTIN) 300 MG capsule 5 CAPS  daily   Yes [provider]  HYDROcodone-acetaminophen (NORCO/VICODIN) 5-325 MG tablet Take 1 tablet by mouth 2 (two) times daily as needed for moderate pain.   Yes [provider]  insulin aspart protamine - aspart (NOVOLOG 70/30 MIX) (70-30) 100 UNIT/ML FlexPen Inject 0.15-0.2 mLs (15-20 Units total) into the skin 2 (two) times daily with a meal. 06/17/21  Yes Reardon, Juanetta Beets, NP  meclizine (ANTIVERT) 25 MG tablet Take 25 mg by mouth 3 (three) times daily as needed. 08/11/20  Yes [provider]  metFORMIN (GLUCOPHAGE) 500 MG tablet Take 2 tablets (1,000 mg total) by mouth 2 (two) times daily with a meal. Patient taking 2 567m tabs at breakfast and supper, 1 5025mtab at lunch Patient taking differently: 2 tabs with breakfast,  2 at supper 06/17/21  Yes Reardon, WhLoree Fee, NP  pravastatin (PRAVACHOL) 40 MG tablet Take 1 tablet (40 mg total) by mouth daily. 06/17/21  Yes ReBrita RompNP  terbinafine (LAMISIL) 250 MG tablet Take 250 mg by mouth daily. 09/16/20  Yes [provider]  Blood Glucose Monitoring Suppl (ACCU-CHEK AVIVA PLUS) w/Device KIT Use as directed bid E11.65 06/29/17   NiCassandria AngerMD  glucose blood (ACCU-CHEK AVIVA PLUS) test strip USE AS DIRECTED TO TEST BLOOD GLUCOSE THREE TIMES DAILY. Dx: E11.65 08/27/21   NiCassandria AngerMD  Insulin Pen Needle (B-D UF III MINI PEN NEEDLES)  31G X 5 MM MISC USE FOR INSULIN INJECTIONS 2 TIMES A DAY 05/28/20   Cassandria Anger, MD  polyethylene glycol-electrolytes (NULYTELY) 420 g solution As directed 07/20/21   Breyson Kelm, Cristopher Estimable, MD    Allergies as of 07/20/2021   (No Known Allergies)    Family History  Problem Relation Age of Onset   Colon cancer Other     Social History   Socioeconomic History   Marital status: Married    Spouse name: Not on file   Number of children: Not on file   Years of education: Not on file   Highest education level: Not on file  Occupational History    Occupation: works at [employer] Use   Smoking status: Former    Types: Cigarettes    Quit date: 02/18/1999    Years since quitting: 22.5   Smokeless tobacco: Never  Vaping Use   Vaping Use: Never used  Substance and Sexual Activity   Alcohol use: No    Comment: no heavy use in past   Drug use: No   Sexual activity: Not on file  Other Topics Concern   Not on file  Social History Narrative   Not on file   Social Determinants of Health   Financial Resource Strain: Not on file  Food Insecurity: Not on file  Transportation Needs: Not on file  Physical Activity: Not on file  Stress: Not on file  Social Connections: Not on file  Intimate Partner Violence: Not on file    Review of Systems: See HPI, otherwise negative ROS  Physical Exam: BP (!) 174/76   Pulse 77   Temp 98.5 F (36.9 C) (Oral)   Resp 15   SpO2 99%  General:   Alert,  Well-developed, well-nourished, pleasant and cooperative in NAD Neck:  Supple; no masses or thyromegaly. No significant cervical adenopathy. Lungs:  Clear throughout to auscultation.   No wheezes, crackles, or rhonchi. No acute distress. Heart:  Regular rate and rhythm; no murmurs, clicks, rubs,  or gallops. Abdomen: Non-distended, normal bowel sounds.  Soft and nontender without appreciable mass or hepatosplenomegaly.  Pulses:  Normal pulses noted. Extremities:  Without clubbing or edema.  Impression/Plan:    70 year old gentleman with a history of advanced colonic adenoma; overdue for surveillance colonoscopy.  I will offer him a surveillance colonoscopy today. The risks, benefits, limitations, alternatives and imponderables have been reviewed with the patient. Questions have been answered. All parties are agreeable.       Notice: This dictation was prepared with Dragon dictation along with smaller phrase technology. Any transcriptional errors that result from this process are unintentional and may not be corrected upon review.

## 2021-09-10 NOTE — Discharge Instructions (Addendum)
  Colonoscopy Discharge Instructions  Read the instructions outlined below and refer to this sheet in the next few weeks. These discharge instructions provide you with general information on caring for yourself after you leave the hospital. Your doctor may also give you specific instructions. While your treatment has been planned according to the most current medical practices available, unavoidable complications occasionally occur. If you have any problems or questions after discharge, call Dr. Gala Romney at [phone]. ACTIVITY You may resume your regular activity, but move at a slower pace for the next 24 hours.  Take frequent rest periods for the next 24 hours.  Walking will help get rid of the air and reduce the bloated feeling in your belly (abdomen).  No driving for 24 hours (because of the medicine (anesthesia) used during the test).   Do not sign any important legal documents or operate any machinery for 24 hours (because of the anesthesia used during the test).  NUTRITION Drink plenty of fluids.  You may resume your normal diet as instructed by your doctor.  Begin with a light meal and progress to your normal diet. Heavy or fried foods are harder to digest and may make you feel sick to your stomach (nauseated).  Avoid alcoholic beverages for 24 hours or as instructed.  MEDICATIONS You may resume your normal medications unless your doctor tells you otherwise.  WHAT YOU CAN EXPECT TODAY Some feelings of bloating in the abdomen.  Passage of more gas than usual.  Spotting of blood in your stool or on the toilet paper.  IF YOU HAD POLYPS REMOVED DURING THE COLONOSCOPY: No aspirin products for 7 days or as instructed.  No alcohol for 7 days or as instructed.  Eat a soft diet for the next 24 hours.  FINDING OUT THE RESULTS OF YOUR TEST Not all test results are available during your visit. If your test results are not back during the visit, make an appointment with your caregiver to find out the  results. Do not assume everything is normal if you have not heard from your caregiver or the medical facility. It is important for you to follow up on all of your test results.  SEEK IMMEDIATE MEDICAL ATTENTION IF: You have more than a spotting of blood in your stool.  Your belly is swollen (abdominal distention).  You are nauseated or vomiting.  You have a temperature over 101.  You have abdominal pain or discomfort that is severe or gets worse throughout the day.     2 small polyps removed from your colon today  Further recommendations to follow pending review of pathology report  At patient request, I called Challen Spainhour at [phone] -  call rolled to voicemail.  I left a message.

## 2021-09-10 NOTE — Anesthesia Procedure Notes (Signed)
Date/Time: 09/10/2021 8:44 AM Performed by: Vista Deck, CRNA Pre-anesthesia Checklist: Patient identified, Emergency Drugs available, Suction available, Timeout performed and Patient being monitored Patient Re-evaluated:Patient Re-evaluated prior to induction Oxygen Delivery Method: Non-rebreather mask

## 2021-09-11 ENCOUNTER — Encounter: Payer: Self-pay | Admitting: Internal Medicine

## 2021-09-11 LAB — SURGICAL PATHOLOGY

## 2021-09-14 ENCOUNTER — Encounter (HOSPITAL_COMMUNITY)
Admission: RE | Admit: 2021-09-14 | Discharge: 2021-09-14 | Disposition: A | Discharge status: Home / Self Care | Payer: Medicare Other | Source: Ambulatory Visit | Attending: Ophthalmology | Admitting: Ophthalmology

## 2021-09-14 DIAGNOSIS — H11151 Pinguecula, right eye: Secondary | ICD-10-CM | POA: Diagnosis not present

## 2021-09-14 DIAGNOSIS — H02834 Dermatochalasis of left upper eyelid: Secondary | ICD-10-CM | POA: Diagnosis not present

## 2021-09-14 DIAGNOSIS — H25813 Combined forms of age-related cataract, bilateral: Secondary | ICD-10-CM | POA: Diagnosis not present

## 2021-09-14 DIAGNOSIS — H02831 Dermatochalasis of right upper eyelid: Secondary | ICD-10-CM | POA: Diagnosis not present

## 2021-09-14 NOTE — Pre-Procedure Instructions (Signed)
Left voice mail for patient to call back for pre-op phone call. °

## 2021-09-15 NOTE — H&P (Addendum)
Surgical History & Physical  Patient Name: Patrick Taylor DOB: November 26, 1951  Surgery: Cataract extraction with intraocular lens implant phacoemulsification; Right Eye  Surgeon: Baruch Goldmann MD Surgery Date:  10-02-2021 Pre-Op Date:  09-14-2021  HPI: A 55 Yr. old male patient Pt is referred by Dr. Jorja Loa for cataract evaluation The patient complains of difficulty when driving due to glare from headlights or sun, which began 2 years ago. Both eyes are affected. The episode is gradual. Symptoms occur when the patient is driving and outside. This is negatively affecting his quality of life. Pt does not use any eye drops. Pt denies any eye pain or increase in floaters/flashes of light. HPI Completed by Dr. Baruch Goldmann  Medical History: Dry Eyes Cataracts Macula Degeneration Glaucoma Arthritis Attention Deficit Hyper Disorder Diabetes High Blood Pressure  Review of Systems Negative Allergic/Immunologic Negative Cardiovascular Negative Constitutional Negative Ear, Nose, Mouth & Throat Negative Endocrine Negative Eyes Negative Gastrointestinal Negative Genitourinary Negative Hemotologic/Lymphatic Negative Integumentary Negative Musculoskeletal Negative Neurological Negative Psychiatry Negative Respiratory  Social   Never smoked  Medication Aspirin, Atorvastatin, Furosemide, Enalapril, Hydrocodone, Metformin, NovoLog, Victoza, Gabapentin, Naproxen, Terbinafine, Pravastatin, Meclizine, Nystatin, Celecoxib,   Sx/Procedures Hernia Sx,   Drug Allergies   NKDA  History & Physical: Heent: Cataract, Right Eye NECK: supple without bruits LUNGS: lungs clear to auscultation CV: regular rate and rhythm Abdomen: soft and non-tender  Impression & Plan: Assessment: 1.  COMBINED FORMS AGE RELATED CATARACT; Both Eyes (H25.813) 2.  Pinguecula; Right Eye (H11.151) 3.  DERMATOCHALASIS; Right Upper Lid, Left Upper Lid (H02.831, H02.834) 4.  ASTIGMATISM, REGULAR; Both Eyes  (H52.223) 5.  BLEPHARITIS; Right Upper Lid, Right Lower Lid, Left Upper Lid, Left Lower Lid (H01.001, H01.002,H01.004,H01.005)  Plan: 1.  Cataract accounts for the patient's decreased vision. This visual impairment is not correctable with a tolerable change in glasses or contact lenses. Cataract surgery with an implantation of a new lens should significantly improve the visual and functional status of the patient. Discussed all risks, benefits, alternatives, and potential complications. Discussed the procedures and recovery. Patient desires to have surgery. A-scan ordered and performed today for intra-ocular lens calculations. The surgery will be performed in order to improve vision for driving, reading, and for eye examinations. Recommend phacoemulsification with intra-ocular lens. Recommend Dextenza for post-operative pain and inflammation. Right Eye worse - first. Dilates well - shugarcaine by protocol. Vision [place]. Discussed Vivity Toric - patient states cannot afford right now.  2.  Observe; Artificial tears as needed for irritation.  3.  Asymptomatic, recommend observation for now. Findings, prognosis and treatment options reviewed.  4.  Toric lens if premium lens elected.  5.  Recommend regular lid cleaning.

## 2021-09-16 ENCOUNTER — Encounter (HOSPITAL_COMMUNITY): Payer: Self-pay

## 2021-09-16 ENCOUNTER — Other Ambulatory Visit: Payer: Self-pay

## 2021-09-21 DIAGNOSIS — Z23 Encounter for immunization: Secondary | ICD-10-CM | POA: Diagnosis not present

## 2021-09-21 DIAGNOSIS — I10 Essential (primary) hypertension: Secondary | ICD-10-CM | POA: Diagnosis not present

## 2021-09-21 DIAGNOSIS — M1991 Primary osteoarthritis, unspecified site: Secondary | ICD-10-CM | POA: Diagnosis not present

## 2021-09-21 DIAGNOSIS — E1140 Type 2 diabetes mellitus with diabetic neuropathy, unspecified: Secondary | ICD-10-CM | POA: Diagnosis not present

## 2021-09-21 DIAGNOSIS — G894 Chronic pain syndrome: Secondary | ICD-10-CM | POA: Diagnosis not present

## 2021-09-28 DIAGNOSIS — H25811 Combined forms of age-related cataract, right eye: Secondary | ICD-10-CM | POA: Diagnosis not present

## 2021-09-30 ENCOUNTER — Other Ambulatory Visit: Payer: Self-pay

## 2021-09-30 ENCOUNTER — Encounter (HOSPITAL_COMMUNITY)
Admission: RE | Admit: 2021-09-30 | Discharge: 2021-09-30 | Disposition: A | Discharge status: Home / Self Care | Payer: Medicare Other | Source: Ambulatory Visit | Attending: Ophthalmology | Admitting: Ophthalmology

## 2021-09-30 DIAGNOSIS — T502X5A Adverse effect of carbonic-anhydrase inhibitors, benzothiadiazides and other diuretics, initial encounter: Secondary | ICD-10-CM

## 2021-10-02 ENCOUNTER — Encounter (HOSPITAL_COMMUNITY): Payer: Self-pay | Admitting: Ophthalmology

## 2021-10-02 ENCOUNTER — Encounter (HOSPITAL_COMMUNITY)
Admission: RE | Disposition: A | Discharge status: Home / Self Care | Payer: Self-pay | Source: Home / Self Care | Attending: Ophthalmology

## 2021-10-02 ENCOUNTER — Ambulatory Visit (HOSPITAL_COMMUNITY): Payer: Medicare Other | Admitting: Anesthesiology

## 2021-10-02 ENCOUNTER — Ambulatory Visit (HOSPITAL_COMMUNITY)
Admission: RE | Admit: 2021-10-02 | Discharge: 2021-10-02 | Disposition: A | Discharge status: Home / Self Care | Payer: Medicare Other | Attending: Ophthalmology | Admitting: Ophthalmology

## 2021-10-02 DIAGNOSIS — Z794 Long term (current) use of insulin: Secondary | ICD-10-CM | POA: Insufficient documentation

## 2021-10-02 DIAGNOSIS — H01002 Unspecified blepharitis right lower eyelid: Secondary | ICD-10-CM | POA: Diagnosis not present

## 2021-10-02 DIAGNOSIS — H01001 Unspecified blepharitis right upper eyelid: Secondary | ICD-10-CM | POA: Diagnosis not present

## 2021-10-02 DIAGNOSIS — H3530 Unspecified macular degeneration: Secondary | ICD-10-CM | POA: Diagnosis not present

## 2021-10-02 DIAGNOSIS — H01005 Unspecified blepharitis left lower eyelid: Secondary | ICD-10-CM | POA: Insufficient documentation

## 2021-10-02 DIAGNOSIS — E1136 Type 2 diabetes mellitus with diabetic cataract: Secondary | ICD-10-CM | POA: Insufficient documentation

## 2021-10-02 DIAGNOSIS — Z7982 Long term (current) use of aspirin: Secondary | ICD-10-CM | POA: Insufficient documentation

## 2021-10-02 DIAGNOSIS — H02834 Dermatochalasis of left upper eyelid: Secondary | ICD-10-CM | POA: Insufficient documentation

## 2021-10-02 DIAGNOSIS — E1165 Type 2 diabetes mellitus with hyperglycemia: Secondary | ICD-10-CM | POA: Diagnosis not present

## 2021-10-02 DIAGNOSIS — H409 Unspecified glaucoma: Secondary | ICD-10-CM | POA: Insufficient documentation

## 2021-10-02 DIAGNOSIS — Z7984 Long term (current) use of oral hypoglycemic drugs: Secondary | ICD-10-CM | POA: Insufficient documentation

## 2021-10-02 DIAGNOSIS — H25813 Combined forms of age-related cataract, bilateral: Secondary | ICD-10-CM | POA: Insufficient documentation

## 2021-10-02 DIAGNOSIS — H52223 Regular astigmatism, bilateral: Secondary | ICD-10-CM | POA: Diagnosis not present

## 2021-10-02 DIAGNOSIS — Z79899 Other long term (current) drug therapy: Secondary | ICD-10-CM | POA: Insufficient documentation

## 2021-10-02 DIAGNOSIS — H25811 Combined forms of age-related cataract, right eye: Secondary | ICD-10-CM | POA: Diagnosis not present

## 2021-10-02 DIAGNOSIS — H01004 Unspecified blepharitis left upper eyelid: Secondary | ICD-10-CM | POA: Insufficient documentation

## 2021-10-02 DIAGNOSIS — Z791 Long term (current) use of non-steroidal anti-inflammatories (NSAID): Secondary | ICD-10-CM | POA: Insufficient documentation

## 2021-10-02 DIAGNOSIS — H02831 Dermatochalasis of right upper eyelid: Secondary | ICD-10-CM | POA: Insufficient documentation

## 2021-10-02 HISTORY — PX: CATARACT EXTRACTION W/PHACO: SHX586

## 2021-10-02 LAB — GLUCOSE, CAPILLARY: Glucose-Capillary: 88 mg/dL (ref 70–99)

## 2021-10-02 SURGERY — PHACOEMULSIFICATION, CATARACT, WITH IOL INSERTION
Anesthesia: Monitor Anesthesia Care | Laterality: Right

## 2021-10-02 MED ORDER — TROPICAMIDE 1 % OP SOLN
1.0000 [drp] | OPHTHALMIC | Status: AC | PRN
  Administered 2021-10-02 (×3): 1 [drp] via OPHTHALMIC

## 2021-10-02 MED ORDER — LIDOCAINE HCL 3.5 % OP GEL
1.0000 "application " | Freq: Once | OPHTHALMIC | Status: AC
  Administered 2021-10-02: 1 via OPHTHALMIC

## 2021-10-02 MED ORDER — NEOMYCIN-POLYMYXIN-DEXAMETH 3.5-10000-0.1 OP SUSP
OPHTHALMIC | Status: DC | PRN
  Administered 2021-10-02: 1 [drp] via OPHTHALMIC

## 2021-10-02 MED ORDER — TETRACAINE HCL 0.5 % OP SOLN
1.0000 [drp] | OPHTHALMIC | Status: AC | PRN
  Administered 2021-10-02 (×3): 1 [drp] via OPHTHALMIC

## 2021-10-02 MED ORDER — PHENYLEPHRINE HCL 2.5 % OP SOLN
1.0000 [drp] | OPHTHALMIC | Status: AC | PRN
  Administered 2021-10-02 (×3): 1 [drp] via OPHTHALMIC

## 2021-10-02 MED ORDER — POVIDONE-IODINE 5 % OP SOLN
OPHTHALMIC | Status: DC | PRN
  Administered 2021-10-02: 1 via OPHTHALMIC

## 2021-10-02 MED ORDER — EPINEPHRINE PF 1 MG/ML IJ SOLN
INTRAOCULAR | Status: DC | PRN
  Administered 2021-10-02: 500 mL

## 2021-10-02 MED ORDER — STERILE WATER FOR IRRIGATION IR SOLN
Status: DC | PRN
  Administered 2021-10-02: 1000 mL

## 2021-10-02 MED ORDER — TRYPAN BLUE 0.06 % IO SOSY
PREFILLED_SYRINGE | INTRAOCULAR | Status: DC | PRN
  Administered 2021-10-02: 0.5 mL via INTRAOCULAR

## 2021-10-02 MED ORDER — SODIUM CHLORIDE 0.9% FLUSH
INTRAVENOUS | Status: DC | PRN
  Administered 2021-10-02: 10 mL via INTRAVENOUS

## 2021-10-02 MED ORDER — TRYPAN BLUE 0.06 % IO SOSY
PREFILLED_SYRINGE | INTRAOCULAR | Status: AC
  Filled 2021-10-02: qty 0.5

## 2021-10-02 MED ORDER — SODIUM HYALURONATE 10 MG/ML IO SOLUTION
PREFILLED_SYRINGE | INTRAOCULAR | Status: DC | PRN
  Administered 2021-10-02: 0.85 mL via INTRAOCULAR

## 2021-10-02 MED ORDER — LIDOCAINE HCL (PF) 1 % IJ SOLN
INTRAOCULAR | Status: DC | PRN
  Administered 2021-10-02: 1 mL via OPHTHALMIC

## 2021-10-02 MED ORDER — EPINEPHRINE PF 1 MG/ML IJ SOLN
INTRAMUSCULAR | Status: AC
  Filled 2021-10-02: qty 1

## 2021-10-02 MED ORDER — BSS IO SOLN
INTRAOCULAR | Status: DC | PRN
  Administered 2021-10-02: 15 mL via INTRAOCULAR

## 2021-10-02 MED ORDER — SODIUM HYALURONATE 23MG/ML IO SOSY
PREFILLED_SYRINGE | INTRAOCULAR | Status: DC | PRN
  Administered 2021-10-02: 0.6 mL via INTRAOCULAR

## 2021-10-02 MED ORDER — MIDAZOLAM HCL 2 MG/2ML IJ SOLN
INTRAMUSCULAR | Status: AC
  Filled 2021-10-02: qty 2

## 2021-10-02 MED ORDER — MIDAZOLAM HCL 5 MG/5ML IJ SOLN
INTRAMUSCULAR | Status: DC | PRN
  Administered 2021-10-02: 2 mg via INTRAVENOUS

## 2021-10-02 SURGICAL SUPPLY — 12 items
CLOTH BEACON ORANGE TIMEOUT ST (SAFETY) ×1 IMPLANT
EYE SHIELD UNIVERSAL CLEAR (GAUZE/BANDAGES/DRESSINGS) ×1 IMPLANT
GLOVE SURG UNDER POLY LF SZ6.5 (GLOVE) ×1 IMPLANT
GLOVE SURG UNDER POLY LF SZ7 (GLOVE) ×1 IMPLANT
NDL HYPO 18GX1.5 BLUNT FILL (NEEDLE) IMPLANT
NEEDLE HYPO 18GX1.5 BLUNT FILL (NEEDLE) ×2 IMPLANT
PAD ARMBOARD 7.5X6 YLW CONV (MISCELLANEOUS) ×1 IMPLANT
RAYONE EMV US (Intraocular Lens) ×1 IMPLANT
SYR TB 1ML LL NO SAFETY (SYRINGE) ×1 IMPLANT
TAPE SURG TRANSPORE 1 IN (GAUZE/BANDAGES/DRESSINGS) IMPLANT
TAPE SURGICAL TRANSPORE 1 IN (GAUZE/BANDAGES/DRESSINGS) ×2
WATER STERILE IRR 250ML POUR (IV SOLUTION) ×1 IMPLANT

## 2021-10-02 NOTE — Anesthesia Postprocedure Evaluation (Signed)
Anesthesia Post Note  Patient: Patrick Taylor  Procedure(s) Performed: CATARACT EXTRACTION PHACO AND INTRAOCULAR LENS PLACEMENT (IOC) (Right)  Patient location during evaluation: Short Stay Anesthesia Type: MAC Level of consciousness: awake and alert Pain management: pain level controlled Vital Signs Assessment: post-procedure vital signs reviewed and stable Respiratory status: spontaneous breathing Cardiovascular status: blood pressure returned to baseline and stable Postop Assessment: no apparent nausea or vomiting Anesthetic complications: no   No notable events documented.   Last Vitals:  Vitals:   10/02/21 0715  BP: 135/62  Pulse: 60  Resp: 14  Temp: 36.9 C  SpO2: 100%    Last Pain:  Vitals:   10/02/21 0715  TempSrc: Oral  PainSc: 0-No pain                 Ladarion Munyon

## 2021-10-02 NOTE — Transfer of Care (Signed)
Immediate Anesthesia Transfer of Care Note  Patient: Patrick Taylor  Procedure(s) Performed: CATARACT EXTRACTION PHACO AND INTRAOCULAR LENS PLACEMENT (IOC) (Right)  Patient Location: Short Stay  Anesthesia Type:MAC  Level of Consciousness: awake  Airway & Oxygen Therapy: Patient Spontanous Breathing  Post-op Assessment: Report given to RN  Post vital signs: Reviewed  Last Vitals:  Vitals Value Taken Time  BP    Temp    Pulse    Resp    SpO2      Last Pain:  Vitals:   10/02/21 0715  TempSrc: Oral  PainSc: 0-No pain         Complications: No notable events documented.

## 2021-10-02 NOTE — Interval H&P Note (Signed)
History and Physical Interval Note:  10/02/2021 8:21 AM  Patrick Taylor  has presented today for surgery, with the diagnosis of Nuclear sclerotic cataract - RIght eye.  The various methods of treatment have been discussed with the patient and family. After consideration of risks, benefits and other options for treatment, the patient has consented to  Procedure(s) with comments: CATARACT EXTRACTION PHACO AND INTRAOCULAR LENS PLACEMENT (IOC) (Right) - right as a surgical intervention.  The patient's history has been reviewed, patient examined, no change in status, stable for surgery.  I have reviewed the patient's chart and labs.  Questions were answered to the patient's satisfaction.     Baruch Goldmann

## 2021-10-02 NOTE — Discharge Instructions (Signed)
Please discharge patient when stable, will follow up today with Dr. Aidyn Sportsman at the [facility] office immediately following discharge.  Leave shield in place until visit.  All paperwork with discharge instructions will be given at the office.  [facility] Address:  [address]  , [place] [address]  

## 2021-10-02 NOTE — Anesthesia Preprocedure Evaluation (Signed)
Anesthesia Evaluation  Patient identified by MRN, date of birth, ID band Patient awake    Reviewed: Allergy & Precautions, NPO status , Patient's Chart, lab work & pertinent test results  Airway Mallampati: II  TM Distance: >3 FB Neck ROM: Full    Dental  (+) Dental Advisory Given, Missing, Edentulous Upper   Pulmonary shortness of breath and with exertion, asthma , COPD,  COPD inhaler, former smoker,    Pulmonary exam normal breath sounds clear to auscultation       Cardiovascular Exercise Tolerance: Poor hypertension, Pt. on medications Normal cardiovascular exam Rhythm:Regular Rate:Normal     Neuro/Psych  Neuromuscular disease negative psych ROS   GI/Hepatic negative GI ROS, Neg liver ROS,   Endo/Other  diabetes, Poorly Controlled, Type 2, Oral Hypoglycemic Agents, Insulin Dependent  Renal/GU negative Renal ROS     Musculoskeletal negative musculoskeletal ROS (+)   Abdominal   Peds  Hematology  (+) anemia ,   Anesthesia Other Findings   Reproductive/Obstetrics negative OB ROS                             Anesthesia Physical  Anesthesia Plan  ASA: 3  Anesthesia Plan: MAC   Post-op Pain Management:    Induction:   PONV Risk Score and Plan:   Airway Management Planned: Nasal Cannula, Natural Airway and Simple Face Mask  Additional Equipment:   Intra-op Plan:   Post-operative Plan:   Informed Consent: I have reviewed the patients History and Physical, chart, labs and discussed the procedure including the risks, benefits and alternatives for the proposed anesthesia with the patient or authorized representative who has indicated his/her understanding and acceptance.     Dental advisory given  Plan Discussed with: CRNA and Surgeon  Anesthesia Plan Comments:         Anesthesia Quick Evaluation

## 2021-10-02 NOTE — Op Note (Signed)
Date of procedure: 10/02/21  Pre-operative diagnosis:  Visually significant combined form age-related cataract, Right Eye (H25.811)  Post-operative diagnosis:  Visually significant combined form age-related cataract, Right Eye (H25.811)  Procedure: Removal of cataract via phacoemulsification and insertion of intra-ocular lens Rayner [id number] +21.5D into the capsular bag of the Right Eye  Attending surgeon: Gerda Diss. Hertha Gergen, MD, MA  Anesthesia: MAC, Topical Akten  Complications: None  Estimated Blood Loss: <71m (minimal)  Specimens: None  Implants: As above  Indications:  Visually significant age-related cataract, Right Eye  Procedure:  The patient was seen and identified in the pre-operative area. The operative eye was identified and dilated.  The operative eye was marked.  Topical anesthesia was administered to the operative eye.     The patient was then to the operative suite and placed in the supine position.  A timeout was performed confirming the patient, procedure to be performed, and all other relevant information.   The patient's face was prepped and draped in the usual fashion for intra-ocular surgery.  A lid speculum was placed into the operative eye and the surgical microscope moved into place and focused.  A superotemporal paracentesis was created using a 20 gauge paracentesis blade. Vision Blue was used to stain the anterior capsule. Shugarcaine was injected into the anterior chamber.  Viscoelastic was injected into the anterior chamber.  A temporal clear-corneal main wound incision was created using a 2.454mmicrokeratome.  A continuous curvilinear capsulorrhexis was initiated using an irrigating cystitome and completed using capsulorrhexis forceps.  Hydrodissection and hydrodeliniation were performed.  Viscoelastic was injected into the anterior chamber.  A phacoemulsification handpiece and a chopper as a second instrument were used to remove the nucleus and epinucleus. The  irrigation/aspiration handpiece was used to remove any remaining cortical material.   The capsular bag was reinflated with viscoelastic, checked, and found to be intact.  The intraocular lens was inserted into the capsular bag.  The irrigation/aspiration handpiece was used to remove any remaining viscoelastic.  The clear corneal wound and paracentesis wounds were then hydrated and checked with Weck-Cels to be watertight.  The lid-speculum was removed.  The drape was removed.  The patient's face was cleaned with a wet and dry 4x4.   Maxitrol was instilled in the eye. A clear shield was taped over the eye. The patient was taken to the post-operative care unit in good condition, having tolerated the procedure well.  Post-Op Instructions: [facility] and will receive all other orders and instructions.

## 2021-10-05 ENCOUNTER — Encounter (HOSPITAL_COMMUNITY): Payer: Self-pay | Admitting: Ophthalmology

## 2021-10-08 ENCOUNTER — Telehealth: Payer: Self-pay | Admitting: *Deleted

## 2021-10-15 DIAGNOSIS — H25812 Combined forms of age-related cataract, left eye: Secondary | ICD-10-CM | POA: Diagnosis not present

## 2021-10-20 ENCOUNTER — Ambulatory Visit (INDEPENDENT_AMBULATORY_CARE_PROVIDER_SITE_OTHER): Payer: Medicare Other | Admitting: Nurse Practitioner

## 2021-10-20 ENCOUNTER — Encounter: Payer: Self-pay | Admitting: Nurse Practitioner

## 2021-10-20 ENCOUNTER — Other Ambulatory Visit: Payer: Self-pay

## 2021-10-20 VITALS — BP 147/72 | HR 65 | Ht 62.0 in | Wt 213.6 lb

## 2021-10-20 DIAGNOSIS — E559 Vitamin D deficiency, unspecified: Secondary | ICD-10-CM | POA: Diagnosis not present

## 2021-10-20 DIAGNOSIS — E782 Mixed hyperlipidemia: Secondary | ICD-10-CM

## 2021-10-20 DIAGNOSIS — I10 Essential (primary) hypertension: Secondary | ICD-10-CM

## 2021-10-20 DIAGNOSIS — E1165 Type 2 diabetes mellitus with hyperglycemia: Secondary | ICD-10-CM | POA: Diagnosis not present

## 2021-10-20 LAB — POCT GLYCOSYLATED HEMOGLOBIN (HGB A1C): HbA1c, POC (controlled diabetic range): 7.8 % — AB (ref 0.0–7.0)

## 2021-10-20 NOTE — Patient Instructions (Signed)

## 2021-10-20 NOTE — Progress Notes (Signed)
10/20/2021   Endocrinology follow-up note   Subjective:    Patient ID: Patrick Taylor, male    DOB: Sep 03, 1951,    Past Medical History:  Diagnosis Date   Anemia    Asthma    Carpal tunnel syndrome    Chronic back pain    Colon polyps    COPD (chronic obstructive pulmonary disease) ([place])    DM (diabetes mellitus) ([place])    HTN (hypertension)    Hyperlipidemia    Past Surgical History:  Procedure Laterality Date   ABDOMINAL HERNIA REPAIR     CATARACT EXTRACTION W/PHACO Right 10/02/2021   Procedure: CATARACT EXTRACTION PHACO AND INTRAOCULAR LENS PLACEMENT ([place]);  Surgeon: Baruch Goldmann, MD;  Location: [facility];  Service: Ophthalmology;  Laterality: Right;  CDE: 4.57    COLONOSCOPY  01/24/09   rectal polyp/adenomatous poylps/tubulovillous   COLONOSCOPY  03/02/2012   Procedure: COLONOSCOPY;  Surgeon: Daneil Dolin, MD;  Location: [facility];  Service: Endoscopy;  Laterality: N/A;  11:00   COLONOSCOPY WITH PROPOFOL N/A 09/10/2021   Procedure: COLONOSCOPY WITH PROPOFOL;  Surgeon: Daneil Dolin, MD;  Location: [facility];  Service: Endoscopy;  Laterality: N/A;  8:45am   POLYPECTOMY  09/10/2021   Procedure: POLYPECTOMY;  Surgeon: Daneil Dolin, MD;  Location: [facility];  Service: Endoscopy;;   Social History   Socioeconomic History   Marital status: Married    Spouse name: Not on file   Number of children: Not on file   Years of education: Not on file   Highest education level: Not on file  Occupational History   Occupation: works at [employer] Use   Smoking status: Former    Packs/day: 1.00    Years: 20.00    Pack years: 20.00    Types: Cigarettes    Quit date: 02/18/1999    Years since quitting: 22.6   Smokeless tobacco: Never  Vaping Use   Vaping Use: Never used  Substance and Sexual Activity   Alcohol use: No    Comment: no heavy use in past   Drug use: No   Sexual activity: Not on file  Other Topics Concern   Not on file  Social History  Narrative   Not on file   Social Determinants of Health   Financial Resource Strain: Not on file  Food Insecurity: Not on file  Transportation Needs: Not on file  Physical Activity: Not on file  Stress: Not on file  Social Connections: Not on file   Outpatient Encounter Medications as of 10/20/2021  Medication Sig   albuterol (VENTOLIN HFA) 108 (90 Base) MCG/ACT inhaler Inhale 1-2 puffs into the lungs every 6 (six) hours as needed.   aspirin 81 MG tablet Take 81 mg by mouth once a week.   Blood Glucose Monitoring Suppl (ACCU-CHEK AVIVA PLUS) w/Device KIT Use as directed bid E11.65   Budeson-Glycopyrrol-Formoterol (BREZTRI AEROSPHERE) 160-9-4.8 MCG/ACT AERO Inhale into the lungs.   calcium-vitamin D (OSCAL WITH D) 500-200 MG-UNIT tablet Take 1 tablet by mouth daily.   celecoxib (CELEBREX) 200 MG capsule Take 200 mg by mouth 2 (two) times daily as needed.   Cholecalciferol (VITAMIN D3) 125 MCG (5000 UT) CAPS Take 5,000 Units by mouth daily.   cyclobenzaprine (FLEXERIL) 10 MG tablet Take 10 mg by mouth every 8 (eight) hours as needed for muscle spasms.   enalapril (VASOTEC) 20 MG tablet Take 1 tablet (20 mg total) by mouth daily.   furosemide (LASIX) 20 MG tablet  Take 20 mg by mouth daily.    gabapentin (NEURONTIN) 300 MG capsule 5 CAPS daily   gabapentin (NEURONTIN) 300 MG capsule Take by mouth.   glucose blood (ACCU-CHEK AVIVA PLUS) test strip USE AS DIRECTED TO TEST BLOOD GLUCOSE THREE TIMES DAILY. Dx: E11.65   HYDROcodone-acetaminophen (NORCO/VICODIN) 5-325 MG tablet Take 1 tablet by mouth 2 (two) times daily as needed for moderate pain.   ILEVRO 0.3 % ophthalmic suspension PLEASE SEE ATTACHED FOR DETAILED DIRECTIONS   insulin aspart protamine - aspart (NOVOLOG 70/30 MIX) (70-30) 100 UNIT/ML FlexPen Inject 0.15-0.2 mLs (15-20 Units total) into the skin 2 (two) times daily with a meal.   Insulin Pen Needle (B-D UF III MINI PEN NEEDLES) 31G X 5 MM MISC USE FOR INSULIN INJECTIONS 2 TIMES A  DAY   meclizine (ANTIVERT) 25 MG tablet Take 25 mg by mouth 3 (three) times daily as needed.   metFORMIN (GLUCOPHAGE) 500 MG tablet Take 2 tablets (1,000 mg total) by mouth 2 (two) times daily with a meal. Patient taking 2 571m tabs at breakfast and supper, 1 501mtab at lunch (Patient taking differently: 2 tabs with breakfast,  2 at supper)   moxifloxacin (VIGAMOX) 0.5 % ophthalmic solution PLEASE SEE ATTACHED FOR DETAILED DIRECTIONS   NOVOLIN 70/30 KWIKPEN (70-30) 100 UNIT/ML KwikPen SMARTSIG:15 Unit(s) SUB-Q Twice Daily   polyethylene glycol-electrolytes (NULYTELY) 420 g solution As directed   pravastatin (PRAVACHOL) 40 MG tablet Take 1 tablet (40 mg total) by mouth daily.   terbinafine (LAMISIL) 250 MG tablet Take 250 mg by mouth daily.   No facility-administered encounter medications on file as of 10/20/2021.   ALLERGIES: No Known Allergies VACCINATION STATUS: Immunization History  Administered Date(s) Administered   Moderna Sars-Covid-2 Vaccination 02/29/2020, 04/02/2020    Diabetes He presents for his follow-up diabetic visit. He has type 2 diabetes mellitus. Onset time: He was diagnosed at approximate age of [age]. His disease course has been improving. Hypoglycemia symptoms include nervousness/anxiousness and sweats. Pertinent negatives for hypoglycemia include no confusion, headaches, pallor or seizures. Pertinent negatives for diabetes include no chest pain, no fatigue, no polydipsia, no polyphagia, no polyuria and no weakness. There are no hypoglycemic complications. Symptoms are stable. Diabetic complications include nephropathy. Risk factors for coronary artery disease include dyslipidemia, diabetes mellitus, hypertension, sedentary lifestyle, male sex, obesity, tobacco exposure and family history. Current diabetic treatment includes insulin injections and oral agent (monotherapy). He is compliant with treatment most of the time. His weight is fluctuating minimally. He is  following a generally unhealthy diet. When asked about meal planning, he reported none. He has not had a previous visit with a dietitian. He rarely participates in exercise. His home blood glucose trend is fluctuating minimally. His breakfast blood glucose range is generally 90-110 mg/dl. His lunch blood glucose range is generally 110-130 mg/dl. His dinner blood glucose range is generally 140-180 mg/dl. (He presents today, accompanied by his wife, with his logs, no meter, showing at goal fasting and postprandial glycemic profile.  His POCT A1c today is 7.8%, improving from last visit of 8.4%.  He reports a drop in glucose right before lunch, has been taking his insulin without eating breakfast.  He denies any significant hypoglycemia though.) An ACE inhibitor/angiotensin II receptor blocker is being taken. He does not see a podiatrist.Eye exam is current.  Hyperlipidemia This is a chronic problem. The current episode started more than 1 year ago. The problem is uncontrolled. Recent lipid tests were reviewed and are high. Exacerbating diseases  include chronic renal disease, diabetes and obesity. There are no known factors aggravating his hyperlipidemia. Pertinent negatives include no chest pain, myalgias or shortness of breath. Current antihyperlipidemic treatment includes statins. The current treatment provides moderate improvement of lipids. Compliance problems include adherence to diet and adherence to exercise.  Risk factors for coronary artery disease include diabetes mellitus, male sex, a sedentary lifestyle, obesity, hypertension and dyslipidemia.  Hypertension This is a chronic problem. The current episode started more than 1 year ago. The problem has been gradually improving since onset. The problem is uncontrolled. Associated symptoms include sweats. Pertinent negatives include no chest pain, headaches, neck pain, palpitations or shortness of breath. There are no associated agents to hypertension. Risk  factors for coronary artery disease include dyslipidemia, diabetes mellitus, obesity, male gender, sedentary lifestyle and smoking/tobacco exposure. Past treatments include ACE inhibitors and diuretics. The current treatment provides mild improvement. There are no compliance problems.  Identifiable causes of hypertension include chronic renal disease.    Review of systems  Constitutional: + Minimally fluctuating body weight,  current Body mass index is 39.07 kg/m. , no fatigue, no subjective hyperthermia, no subjective hypothermia Eyes: no blurry vision, no xerophthalmia ENT: no sore throat, no nodules palpated in throat, no dysphagia/odynophagia, no hoarseness Cardiovascular: no chest pain, no shortness of breath, no palpitations, no leg swelling Respiratory: no cough, no shortness of breath Gastrointestinal: no nausea/vomiting/diarrhea Musculoskeletal: no muscle/joint aches Skin: no rashes, no hyperemia Neurological: no tremors, no numbness, no tingling, no dizziness Psychiatric: no depression, no anxiety   Objective:    BP (!) 147/72   Pulse 65   Ht '5\' 2"'  (1.575 m)   Wt 213 lb 9.6 oz (96.9 kg)   BMI 39.07 kg/m   Wt Readings from Last 3 Encounters:  10/20/21 213 lb 9.6 oz (96.9 kg)  09/16/21 213 lb (96.6 kg)  09/08/21 213 lb (96.6 kg)    BP Readings from Last 3 Encounters:  10/20/21 (!) 147/72  10/02/21 (!) 168/77  09/10/21 (!) 125/57     Physical Exam- Limited  Constitutional:  Body mass index is 39.07 kg/m. , not in acute distress, normal state of mind Eyes:  EOMI, no exophthalmos Neck: Supple Cardiovascular: RRR, no murmurs, rubs, or gallops, no edema Respiratory: Adequate breathing efforts, no crackles, rales, rhonchi, or wheezing Musculoskeletal: no gross deformities, strength intact in all four extremities, no gross restriction of joint movements Skin:  no rashes, no hyperemia Neurological: no tremor with outstretched hands    Results for orders placed or  performed in visit on 10/20/21  POCT glycosylated hemoglobin (Hb A1C)  Result Value Ref Range   Hemoglobin A1C     HbA1c POC (<> result, manual entry)     HbA1c, POC (prediabetic range)     HbA1c, POC (controlled diabetic range) 7.8 (A) 0.0 - 7.0 %  Diabetic Labs (most recent): Lab Results  Component Value Date   HGBA1C 7.8 (A) 10/20/2021   HGBA1C 8.4 (A) 06/17/2021   HGBA1C 8.4 (A) 02/12/2021    Lipid Panel     Component Value Date/Time   CHOL 174 06/10/2021 0824   TRIG 182 (H) 06/10/2021 0824   HDL 41 06/10/2021 0824   CHOLHDL 4.2 06/10/2021 0824   LDLCALC 101 (H) 06/10/2021 0824   CMP Latest Ref Rng & Units 09/08/2021 06/10/2021 02/06/2021  Glucose 70 - 99 mg/dL 147(H) 151(H) 80  BUN 8 - 23 mg/dL '18 19 20  ' Creatinine 0.61 - 1.24 mg/dL 0.97 1.10 1.03  Sodium 135 -  145 mmol/L 138 140 141  Potassium 3.5 - 5.1 mmol/L 4.2 4.8 4.4  Chloride 98 - 111 mmol/L 102 100 101  CO2 22 - 32 mmol/L '29 24 24  ' Calcium 8.9 - 10.3 mg/dL 9.7 10.0 9.6  Total Protein 6.0 - 8.5 g/dL - 6.9 7.3  Total Bilirubin 0.0 - 1.2 mg/dL - 0.3 0.3  Alkaline Phos 44 - 121 IU/L - 69 79  AST 0 - 40 IU/L - 21 21  ALT 0 - 44 IU/L - 8 6     Assessment & Plan:   1) Uncontrolled type 2 diabetes mellitus without complication, with long-term current use of insulin ([place])  He presents today, accompanied by his wife, with his logs, no meter, showing at goal fasting and postprandial glycemic profile.  His POCT A1c today is 7.8%, improving from last visit of 8.4%.  He reports a drop in glucose right before lunch, has been taking his insulin without eating breakfast.  He denies any significant hypoglycemia though.   Glucose logs and insulin administration records pertaining to this visit,  to be scanned into patient's records.  Recent labs reviewed.  - Patient remains at a high risk for more acute and chronic complications of diabetes which include CAD, CVA, CKD, retinopathy, and neuropathy. These are all discussed in  detail with the patient.  - Nutritional counseling repeated at each appointment due to patients tendency to fall back in to old habits.  - The patient admits there is a room for improvement in their diet and drink choices. -  Suggestion is made for the patient to avoid simple carbohydrates from their diet including Cakes, Sweet Desserts / Pastries, Ice Cream, Soda (diet and regular), Sweet Tea, Candies, Chips, Cookies, Sweet Pastries, Store Bought Juices, Alcohol in Excess of 1-2 drinks a day, Artificial Sweeteners, Coffee Creamer, and "Sugar-free" Products. This will help patient to have stable blood glucose profile and potentially avoid unintended weight gain.   - I encouraged the patient to switch to unprocessed or minimally processed complex starch and increased protein intake (animal or plant source), fruits, and vegetables.   - Patient is advised to stick to a routine mealtimes to eat 3 meals a day and avoid unnecessary snacks (to snack only to correct hypoglycemia).  - I have approached patient with the following individualized plan to manage diabetes and patient agrees.  -He has responded very well to premixed insulin using Novolin 70/30.   -Given his stable glycemic profile,  he is advised to continue  his 70/30 at 20 units with breakfast and 15 units with supper (if glucose is above 90 and he is eating).  We discussed the need to eat breakfast to avoid hypoglycemia.  He stopped taking the Victoza in between visits for unknown reasons, can stay off of it for now.  He had been taking an extra 500 of Metformin at lunch.  He is advised to stick to the 1000 of Metformin twice daily with meals.  Will recheck kidney function prior to next visit.   -He is encouraged to monitor glucose at least 3 times daily, before injecting insulin (at breakfast and supper) and before bed, and call the clinic if he has readings less than 70 or greater than 200 for 3 days out of the week.  - Patient specific  target  for A1c; LDL, HDL, Triglycerides, and  Waist Circumference were discussed in detail.  2) BP/HTN:  His blood pressure is not controlled to target but has improved.  He  had not long taken his medications prior to todays visit.  He is advised to continue Enalapril 20 mg po daily and Lasix 40 mg po daily.  He monitors his BP at home and says it is typically normal.    3) Lipids/HPL:  His most recent lipid panel from 06/10/21 shows uncontrolled LDL at 101 and elevated triglycerides of 182.  He is advised to increase his Pravastatin  to 40 mg po daily at bedtime (he may take 2 of his 20 mg tabs until he depletes his supply).  Side effects and precautions discussed with patient.    4)  Weight/Diet:  His Body mass index is 39.07 kg/m.--clearly complicating his diabetes care.  He is a candidate for modest weight loss.  CDE consult in progress, exercise, and carbohydrates information provided.  5) Chronic Care/Health Maintenance: -Patient is on ACEI and Statin medications and encouraged to continue to follow up with Ophthalmology, Podiatrist at least yearly or according to recommendations, and advised to stay away from smoking. I have recommended yearly flu vaccine and pneumonia vaccination at least every 5 years; moderate intensity exercise for up to 150 minutes weekly; and  sleep for at least 7 hours a day.  6) Vitamin D deficiency -His recent vitamin D level was 31 on 06/10/21.  He is advised to continue OTC Vitamin D3 5000 units daily as maintenance dose, especially during the winter months.   I advised patient to maintain close follow up with his PCP for primary care needs.      I spent 35 minutes in the care of the patient today including review of labs from [place], Lipids, Thyroid Function, Hematology (current and previous including abstractions from other facilities); face-to-face time discussing  his blood glucose readings/logs, discussing hypoglycemia and hyperglycemia episodes and  symptoms, medications doses, his options of short and long term treatment based on the latest standards of care / guidelines;  discussion about incorporating lifestyle medicine;  and documenting the encounter.    Please refer to Patient Instructions for Blood Glucose Monitoring and Insulin/Medications Dosing Guide"  in media tab for additional information. Please  also refer to " Patient Self Inventory" in the Media  tab for reviewed elements of pertinent patient history.  Patrick Taylor participated in the discussions, expressed understanding, and voiced agreement with the above plans.  All questions were answered to his satisfaction. he is encouraged to contact clinic should he have any questions or concerns prior to his return visit.    Follow up plan: Return in about 4 months (around 02/17/2022) for Diabetes F/U with A1c in office, Previsit labs, Bring meter and logs.  Rayetta Pigg, [facility] [address] [place], [place] [address] Phone: [phone] Fax: [phone]  10/20/2021, 8:37 AM

## 2021-10-21 ENCOUNTER — Other Ambulatory Visit: Payer: Self-pay | Admitting: Nurse Practitioner

## 2021-10-23 ENCOUNTER — Other Ambulatory Visit: Payer: Self-pay

## 2021-10-23 ENCOUNTER — Encounter (HOSPITAL_COMMUNITY)
Admission: RE | Admit: 2021-10-23 | Discharge: 2021-10-23 | Disposition: A | Discharge status: Home / Self Care | Payer: Medicare Other | Source: Ambulatory Visit | Attending: Ophthalmology | Admitting: Ophthalmology

## 2021-10-23 ENCOUNTER — Encounter (HOSPITAL_COMMUNITY): Payer: Self-pay

## 2021-10-23 NOTE — H&P (Signed)
Surgical History & Physical  Patient Name: Patrick Taylor DOB: 1951-05-10  Surgery: Cataract extraction with intraocular lens implant phacoemulsification; Left Eye  Surgeon: Baruch Goldmann MD Surgery Date:  10-29-21 Pre-Op Date:  10-08-21  HPI: [name] The patient is returning after cataract surgery. The right eye is affected. Status post cataract surgery, which began 1 week ago: Since the last visit, the affected area is doing well. The patient's vision is improved. Patient is following medication instructions. Pt taking PO combo drops TID OD. Pt denies any eye pain or increase in floaters/flashes *Pt is hesitant about next sx. Pt states he may want to wait until next year possibly. Wants to discuss further with Dr. Marisa Taylor* HPI was performed by Baruch Goldmann .  Medical History: Dry Eyes Cataracts Macula Degeneration Glaucoma Arthritis Attention Deficit Hyper Disorder Diabetes High Blood Pressure  Review of Systems Negative Allergic/Immunologic Negative Cardiovascular Negative Constitutional Negative Ear, Nose, Mouth & Throat Negative Endocrine Negative Eyes Negative Gastrointestinal Negative Genitourinary Negative Hemotologic/Lymphatic Negative Integumentary Negative Musculoskeletal Negative Neurological Negative Psychiatry Negative Respiratory  Social   Never smoked   Medication Prednisolone acetate 1%, Ilevro, Moxifloxacin,  Aspirin, Atorvastatin, Furosemide, Enalapril, Hydrocodone, Metformin, NovoLog, Victoza, Gabapentin, Naproxen, Terbinafine, Pravastatin, Meclizine, Nystatin, Celecoxib,   Sx/Procedures Phaco c IOL OD, Hernia Sx,   Drug Allergies   NKDA  History & Physical: Heent: Cataract, Left Eye NECK: supple without bruits LUNGS: lungs clear to auscultation CV: regular rate and rhythm Abdomen: soft and non-tender  Impression & Plan: Assessment: 1.  CATARACT EXTRACTION STATUS; Right Eye (Z98.41) 2.  INTRAOCULAR LENS IOL  (Z96.1) 3.  COMBINED FORMS AGE RELATED CATARACT; Both Eyes (H25.813) 4.  Myopia ; Right Eye (H52.11) 5.  Presbyopia ; Both Eyes (H52.4) 6.  NUCLEAR SCLEROSIS AGE RELATED; Left Eye (H25.12)  Plan: 1.  1 week after cataract surgery. Doing well with improved vision and normal eye pressure. Call with any problems or concerns. Stop Vigamox. Continue Ilevro 1 drop 1x/day for 3 more weeks. Continue Pred Acetate 1 drop 2x/day for 3 more weeks.  2.  Doing well since surgery Continue Post-op medications  3.  Cataract accounts for the patient's decreased vision. This visual impairment is not correctable with a tolerable change in glasses or contact lenses. Cataract surgery with an implantation of a new lens should significantly improve the visual and functional status of the patient. Discussed all risks, benefits, alternatives, and potential complications. Discussed the procedures and recovery. Patient desires to have surgery. A-scan ordered and performed today for intra-ocular lens calculations. The surgery will be performed in order to improve vision for driving, reading, and for eye examinations. Recommend phacoemulsification with intra-ocular lens. Recommend Dextenza for post-operative pain and inflammation. Left Eye. Surgery required to correct imbalance of vision. Dilates well - shugarcaine by protocol. [facility] [place].  4.   5.   6.

## 2021-10-29 ENCOUNTER — Encounter (HOSPITAL_COMMUNITY): Payer: Self-pay | Admitting: Ophthalmology

## 2021-10-29 ENCOUNTER — Ambulatory Visit (HOSPITAL_COMMUNITY): Payer: Medicare Other | Admitting: Certified Registered"

## 2021-10-29 ENCOUNTER — Ambulatory Visit (HOSPITAL_COMMUNITY)
Admission: RE | Admit: 2021-10-29 | Discharge: 2021-10-29 | Disposition: A | Discharge status: Home / Self Care | Payer: Medicare Other | Attending: Ophthalmology | Admitting: Ophthalmology

## 2021-10-29 ENCOUNTER — Encounter (HOSPITAL_COMMUNITY)
Admission: RE | Disposition: A | Discharge status: Home / Self Care | Payer: Self-pay | Source: Home / Self Care | Attending: Ophthalmology

## 2021-10-29 DIAGNOSIS — H524 Presbyopia: Secondary | ICD-10-CM | POA: Diagnosis not present

## 2021-10-29 DIAGNOSIS — H25812 Combined forms of age-related cataract, left eye: Secondary | ICD-10-CM | POA: Diagnosis not present

## 2021-10-29 DIAGNOSIS — D649 Anemia, unspecified: Secondary | ICD-10-CM | POA: Diagnosis not present

## 2021-10-29 DIAGNOSIS — H5211 Myopia, right eye: Secondary | ICD-10-CM | POA: Diagnosis not present

## 2021-10-29 DIAGNOSIS — Z9841 Cataract extraction status, right eye: Secondary | ICD-10-CM | POA: Diagnosis not present

## 2021-10-29 DIAGNOSIS — Z961 Presence of intraocular lens: Secondary | ICD-10-CM | POA: Diagnosis not present

## 2021-10-29 DIAGNOSIS — D759 Disease of blood and blood-forming organs, unspecified: Secondary | ICD-10-CM | POA: Insufficient documentation

## 2021-10-29 DIAGNOSIS — Z87891 Personal history of nicotine dependence: Secondary | ICD-10-CM | POA: Insufficient documentation

## 2021-10-29 DIAGNOSIS — G709 Myoneural disorder, unspecified: Secondary | ICD-10-CM | POA: Insufficient documentation

## 2021-10-29 DIAGNOSIS — E1136 Type 2 diabetes mellitus with diabetic cataract: Secondary | ICD-10-CM | POA: Insufficient documentation

## 2021-10-29 DIAGNOSIS — Z7984 Long term (current) use of oral hypoglycemic drugs: Secondary | ICD-10-CM | POA: Insufficient documentation

## 2021-10-29 DIAGNOSIS — J449 Chronic obstructive pulmonary disease, unspecified: Secondary | ICD-10-CM | POA: Insufficient documentation

## 2021-10-29 DIAGNOSIS — Z794 Long term (current) use of insulin: Secondary | ICD-10-CM | POA: Insufficient documentation

## 2021-10-29 DIAGNOSIS — E1165 Type 2 diabetes mellitus with hyperglycemia: Secondary | ICD-10-CM | POA: Diagnosis not present

## 2021-10-29 DIAGNOSIS — I10 Essential (primary) hypertension: Secondary | ICD-10-CM | POA: Insufficient documentation

## 2021-10-29 HISTORY — PX: CATARACT EXTRACTION W/PHACO: SHX586

## 2021-10-29 LAB — GLUCOSE, CAPILLARY: Glucose-Capillary: 124 mg/dL — ABNORMAL HIGH (ref 70–99)

## 2021-10-29 SURGERY — PHACOEMULSIFICATION, CATARACT, WITH IOL INSERTION
Anesthesia: Monitor Anesthesia Care | Site: Eye | Laterality: Left

## 2021-10-29 MED ORDER — POVIDONE-IODINE 5 % OP SOLN
OPHTHALMIC | Status: DC | PRN
  Administered 2021-10-29: 1 via OPHTHALMIC

## 2021-10-29 MED ORDER — SODIUM HYALURONATE 10 MG/ML IO SOLUTION
PREFILLED_SYRINGE | INTRAOCULAR | Status: DC | PRN
  Administered 2021-10-29: 0.85 mL via INTRAOCULAR

## 2021-10-29 MED ORDER — NEOMYCIN-POLYMYXIN-DEXAMETH 3.5-10000-0.1 OP SUSP
OPHTHALMIC | Status: DC | PRN
  Administered 2021-10-29: 1 [drp] via OPHTHALMIC

## 2021-10-29 MED ORDER — LIDOCAINE HCL 3.5 % OP GEL
1.0000 "application " | Freq: Once | OPHTHALMIC | Status: AC
  Administered 2021-10-29: 1 via OPHTHALMIC

## 2021-10-29 MED ORDER — EPINEPHRINE PF 1 MG/ML IJ SOLN
INTRAMUSCULAR | Status: AC
  Filled 2021-10-29: qty 2

## 2021-10-29 MED ORDER — BSS IO SOLN
INTRAOCULAR | Status: DC | PRN
  Administered 2021-10-29: 15 mL via INTRAOCULAR

## 2021-10-29 MED ORDER — TROPICAMIDE 1 % OP SOLN
1.0000 [drp] | OPHTHALMIC | Status: AC | PRN
  Administered 2021-10-29 (×3): 1 [drp] via OPHTHALMIC
  Filled 2021-10-29: qty 2

## 2021-10-29 MED ORDER — SODIUM CHLORIDE 0.9% FLUSH
INTRAVENOUS | Status: DC | PRN
  Administered 2021-10-29 (×2): 5 mL via INTRAVENOUS

## 2021-10-29 MED ORDER — SODIUM HYALURONATE 23MG/ML IO SOSY
PREFILLED_SYRINGE | INTRAOCULAR | Status: DC | PRN
  Administered 2021-10-29: 0.6 mL via INTRAOCULAR

## 2021-10-29 MED ORDER — STERILE WATER FOR IRRIGATION IR SOLN
Status: DC | PRN
  Administered 2021-10-29: 250 mL

## 2021-10-29 MED ORDER — LIDOCAINE 3.5 % OP GEL OPTIME - NO CHARGE
OPHTHALMIC | Status: DC | PRN
  Administered 2021-10-29: 14:00:00 1 [drp] via OPHTHALMIC

## 2021-10-29 MED ORDER — EPINEPHRINE PF 1 MG/ML IJ SOLN
INTRAOCULAR | Status: DC | PRN
  Administered 2021-10-29: 14:00:00 500 mL

## 2021-10-29 MED ORDER — LIDOCAINE HCL (PF) 1 % IJ SOLN
INTRAOCULAR | Status: DC | PRN
  Administered 2021-10-29: 14:00:00 1 mL via OPHTHALMIC

## 2021-10-29 MED ORDER — TRYPAN BLUE 0.06 % IO SOSY
PREFILLED_SYRINGE | INTRAOCULAR | Status: AC
  Filled 2021-10-29: qty 0.5

## 2021-10-29 MED ORDER — PHENYLEPHRINE HCL 2.5 % OP SOLN
1.0000 [drp] | OPHTHALMIC | Status: AC | PRN
  Administered 2021-10-29 (×3): 1 [drp] via OPHTHALMIC

## 2021-10-29 MED ORDER — MIDAZOLAM HCL 2 MG/2ML IJ SOLN
INTRAMUSCULAR | Status: DC | PRN
  Administered 2021-10-29 (×2): 1 mg via INTRAVENOUS

## 2021-10-29 MED ORDER — TETRACAINE HCL 0.5 % OP SOLN
1.0000 [drp] | OPHTHALMIC | Status: AC | PRN
  Administered 2021-10-29 (×3): 1 [drp] via OPHTHALMIC

## 2021-10-29 MED ORDER — MIDAZOLAM HCL 2 MG/2ML IJ SOLN
INTRAMUSCULAR | Status: AC
  Filled 2021-10-29: qty 2

## 2021-10-29 SURGICAL SUPPLY — 11 items
CLOTH BEACON ORANGE TIMEOUT ST (SAFETY) ×1 IMPLANT
EYE SHIELD UNIVERSAL CLEAR (GAUZE/BANDAGES/DRESSINGS) ×1 IMPLANT
GLOVE SURG UNDER POLY LF SZ7 (GLOVE) ×2 IMPLANT
NDL HYPO 18GX1.5 BLUNT FILL (NEEDLE) IMPLANT
NEEDLE HYPO 18GX1.5 BLUNT FILL (NEEDLE) ×2 IMPLANT
PAD ARMBOARD 7.5X6 YLW CONV (MISCELLANEOUS) ×1 IMPLANT
RayOne EMV US (Intraocular Lens) ×1 IMPLANT
SYR TB 1ML LL NO SAFETY (SYRINGE) ×1 IMPLANT
TAPE SURG TRANSPORE 1 IN (GAUZE/BANDAGES/DRESSINGS) IMPLANT
TAPE SURGICAL TRANSPORE 1 IN (GAUZE/BANDAGES/DRESSINGS) ×2
WATER STERILE IRR 250ML POUR (IV SOLUTION) ×1 IMPLANT

## 2021-10-29 NOTE — Op Note (Signed)
Date of procedure: 10/29/21  Pre-operative diagnosis: Visually significant age-related combined cataract, Left Eye (H25.812)  Post-operative diagnosis: Visually significant age-related combined cataract, Left Eye (H25.812)  Procedure: Removal of cataract via phacoemulsification and insertion of intra-ocular lens Rayner [id number] +22.0D into the capsular bag of the Left Eye  Attending surgeon: Gerda Diss. Shanieka Blea, MD, MA  Anesthesia: MAC, Topical [place]  Complications: None  Estimated Blood Loss: <21m (minimal)  Specimens: None  Implants: As above  Indications:  Visually significant age-related cataract, Left Eye  Procedure:  The patient was seen and identified in the pre-operative area. The operative eye was identified and dilated.  The operative eye was marked.  Topical anesthesia was administered to the operative eye.     The patient was then to the operative suite and placed in the supine position.  A timeout was performed confirming the patient, procedure to be performed, and all other relevant information.   The patient's face was prepped and draped in the usual fashion for intra-ocular surgery.  A lid speculum was placed into the operative eye and the surgical microscope moved into place and focused.  An inferotemporal paracentesis was created using a 20 gauge paracentesis blade.  Shugarcaine was injected into the anterior chamber.  Viscoelastic was injected into the anterior chamber.  A temporal clear-corneal main wound incision was created using a 2.477mmicrokeratome.  A continuous curvilinear capsulorrhexis was initiated using an irrigating cystitome and completed using capsulorrhexis forceps.  Hydrodissection and hydrodeliniation were performed.  Viscoelastic was injected into the anterior chamber.  A phacoemulsification handpiece and a chopper as a second instrument were used to remove the nucleus and epinucleus. The irrigation/aspiration handpiece was used to remove any remaining  cortical material.   The capsular bag was reinflated with viscoelastic, checked, and found to be intact.  The intraocular lens was inserted into the capsular bag.  The irrigation/aspiration handpiece was used to remove any remaining viscoelastic.  The clear corneal wound and paracentesis wounds were then hydrated and checked with Weck-Cels to be watertight.  The lid-speculum was removed.  The drape was removed.  The patient's face was cleaned with a wet and dry 4x4.   Maxitrol was instilled in the eye. A clear shield was taped over the eye. The patient was taken to the post-operative care unit in good condition, having tolerated the procedure well.  Post-Op Instructions: [facility] and will receive all other orders and instructions.

## 2021-10-29 NOTE — Discharge Instructions (Addendum)
Please discharge patient when stable, will follow up today with Dr. Wrzosek at the [facility] office immediately following discharge.  Leave shield in place until visit.  All paperwork with discharge instructions will be given at the office.  [facility] [place] [facility] Address:  [address]  [place], [place] [address]  

## 2021-10-29 NOTE — Interval H&P Note (Signed)
History and Physical Interval Note:  10/29/2021 1:26 PM  Patrick Taylor  has presented today for surgery, with the diagnosis of nuclear cataract left eye.  The various methods of treatment have been discussed with the patient and family. After consideration of risks, benefits and other options for treatment, the patient has consented to  Procedure(s) with comments: CATARACT EXTRACTION PHACO AND INTRAOCULAR LENS PLACEMENT ([place]) (Left) - left as a surgical intervention.  The patient's history has been reviewed, patient examined, no change in status, stable for surgery.  I have reviewed the patient's chart and labs.  Questions were answered to the patient's satisfaction.     Baruch Goldmann

## 2021-10-29 NOTE — Transfer of Care (Signed)
Immediate Anesthesia Transfer of Care Note  Patient: Patrick Taylor  Procedure(s) Performed: CATARACT EXTRACTION PHACO AND INTRAOCULAR LENS PLACEMENT LEFT EYE (Left: Eye)  Patient Location: Short Stay  Anesthesia Type:MAC  Level of Consciousness: awake, alert  and oriented  Airway & Oxygen Therapy: Patient Spontanous Breathing  Post-op Assessment: Report given to RN, Post -op Vital signs reviewed and stable and Patient moving all extremities X 4  Post vital signs: Reviewed and stable  Last Vitals:  Vitals Value Taken Time  BP    Temp    Pulse    Resp    SpO2      Last Pain:  Vitals:   10/29/21 1059  TempSrc: Oral  PainSc:          Complications: No notable events documented.

## 2021-10-29 NOTE — Anesthesia Preprocedure Evaluation (Signed)
Anesthesia Evaluation  Patient identified by MRN, date of birth, ID band Patient awake    Reviewed: Allergy & Precautions, NPO status , Patient's Chart, lab work & pertinent test results  Airway Mallampati: II  TM Distance: >3 FB Neck ROM: Full    Dental  (+) Dental Advisory Given, Missing, Edentulous Upper   Pulmonary shortness of breath and with exertion, asthma , COPD,  COPD inhaler, former smoker,    Pulmonary exam normal breath sounds clear to auscultation       Cardiovascular Exercise Tolerance: Poor hypertension, Pt. on medications Normal cardiovascular exam Rhythm:Regular Rate:Normal     Neuro/Psych  Neuromuscular disease negative psych ROS   GI/Hepatic negative GI ROS, Neg liver ROS,   Endo/Other  diabetes, Poorly Controlled, Type 2, Oral Hypoglycemic Agents, Insulin Dependent  Renal/GU negative Renal ROS     Musculoskeletal negative musculoskeletal ROS (+)   Abdominal   Peds  Hematology  (+) Blood dyscrasia, anemia ,   Anesthesia Other Findings   Reproductive/Obstetrics negative OB ROS                             Anesthesia Physical  Anesthesia Plan  ASA: 3  Anesthesia Plan: MAC   Post-op Pain Management:    Induction:   PONV Risk Score and Plan:   Airway Management Planned: Nasal Cannula, Natural Airway and Simple Face Mask  Additional Equipment:   Intra-op Plan:   Post-operative Plan:   Informed Consent: I have reviewed the patients History and Physical, chart, labs and discussed the procedure including the risks, benefits and alternatives for the proposed anesthesia with the patient or authorized representative who has indicated his/her understanding and acceptance.     Dental advisory given  Plan Discussed with: CRNA and Surgeon  Anesthesia Plan Comments:         Anesthesia Quick Evaluation

## 2021-10-29 NOTE — Anesthesia Postprocedure Evaluation (Signed)
Anesthesia Post Note  Patient: Patrick Taylor  Procedure(s) Performed: CATARACT EXTRACTION PHACO AND INTRAOCULAR LENS PLACEMENT LEFT EYE (Left: Eye)  Patient location during evaluation: Phase II Anesthesia Type: MAC Level of consciousness: awake Pain management: pain level controlled Vital Signs Assessment: post-procedure vital signs reviewed and stable Respiratory status: spontaneous breathing and respiratory function stable Cardiovascular status: blood pressure returned to baseline and stable Postop Assessment: no headache and no apparent nausea or vomiting Anesthetic complications: no Comments: Late entry   No notable events documented.   Last Vitals:  Vitals:   10/29/21 1059 10/29/21 1351  BP: (!) 176/77 (!) 143/72  Pulse: 73 62  Resp: 15 16  Temp: 37.1 C 36.9 C  SpO2: 98% 99%    Last Pain:  Vitals:   10/29/21 1351  TempSrc: Oral  PainSc: 0-No pain                 Louann Sjogren

## 2021-10-29 NOTE — Anesthesia Procedure Notes (Signed)
Procedure Name: MAC Date/Time: 10/29/2021 1:37 PM Performed by: Orlie Dakin, CRNA Pre-anesthesia Checklist: Patient identified, Emergency Drugs available, Suction available and Patient being monitored Patient Re-evaluated:Patient Re-evaluated prior to induction Oxygen Delivery Method: Nasal cannula Placement Confirmation: positive ETCO2

## 2021-10-30 ENCOUNTER — Encounter (HOSPITAL_COMMUNITY): Payer: Self-pay | Admitting: Ophthalmology

## 2021-11-03 NOTE — Telephone Encounter (Signed)
Error message

## 2021-11-23 DIAGNOSIS — M1991 Primary osteoarthritis, unspecified site: Secondary | ICD-10-CM | POA: Diagnosis not present

## 2021-11-23 DIAGNOSIS — E1129 Type 2 diabetes mellitus with other diabetic kidney complication: Secondary | ICD-10-CM | POA: Diagnosis not present

## 2021-11-23 DIAGNOSIS — I10 Essential (primary) hypertension: Secondary | ICD-10-CM | POA: Diagnosis not present

## 2021-11-23 DIAGNOSIS — L818 Other specified disorders of pigmentation: Secondary | ICD-10-CM | POA: Diagnosis not present

## 2021-11-23 DIAGNOSIS — J449 Chronic obstructive pulmonary disease, unspecified: Secondary | ICD-10-CM | POA: Diagnosis not present

## 2021-11-23 DIAGNOSIS — G894 Chronic pain syndrome: Secondary | ICD-10-CM | POA: Diagnosis not present

## 2021-12-17 ENCOUNTER — Other Ambulatory Visit: Payer: Self-pay

## 2021-12-17 ENCOUNTER — Other Ambulatory Visit: Payer: Self-pay | Admitting: Nurse Practitioner

## 2021-12-17 MED ORDER — ONETOUCH ULTRASOFT LANCETS MISC: 1 refills | Status: DC

## 2021-12-17 MED ORDER — ONETOUCH ULTRA VI STRP: ORAL_STRIP | 1 refills | Status: DC

## 2021-12-17 MED ORDER — ONETOUCH ULTRA 2 W/DEVICE KIT: PACK | 1 refills | Status: DC

## 2021-12-17 NOTE — Telephone Encounter (Signed)
Received a fax from [facility] stating the patients AccuChek is not covered by his insurance and they requested a new meter with strips and lancets. I have sent to [facility] in [place].

## 2021-12-24 DIAGNOSIS — Z1331 Encounter for screening for depression: Secondary | ICD-10-CM | POA: Diagnosis not present

## 2021-12-24 DIAGNOSIS — Z0001 Encounter for general adult medical examination with abnormal findings: Secondary | ICD-10-CM | POA: Diagnosis not present

## 2021-12-24 DIAGNOSIS — Z6838 Body mass index (BMI) 38.0-38.9, adult: Secondary | ICD-10-CM | POA: Diagnosis not present

## 2021-12-24 DIAGNOSIS — J449 Chronic obstructive pulmonary disease, unspecified: Secondary | ICD-10-CM | POA: Diagnosis not present

## 2021-12-24 DIAGNOSIS — I10 Essential (primary) hypertension: Secondary | ICD-10-CM | POA: Diagnosis not present

## 2021-12-24 DIAGNOSIS — E1129 Type 2 diabetes mellitus with other diabetic kidney complication: Secondary | ICD-10-CM | POA: Diagnosis not present

## 2021-12-24 DIAGNOSIS — M1991 Primary osteoarthritis, unspecified site: Secondary | ICD-10-CM | POA: Diagnosis not present

## 2021-12-24 DIAGNOSIS — E669 Obesity, unspecified: Secondary | ICD-10-CM | POA: Diagnosis not present

## 2022-02-15 DIAGNOSIS — E1165 Type 2 diabetes mellitus with hyperglycemia: Secondary | ICD-10-CM | POA: Diagnosis not present

## 2022-02-16 LAB — COMPREHENSIVE METABOLIC PANEL
ALT: 12 IU/L (ref 0–44)
AST: 23 IU/L (ref 0–40)
Albumin/Globulin Ratio: 2.3 — ABNORMAL HIGH (ref 1.2–2.2)
Albumin: 4.8 g/dL (ref 3.8–4.8)
Alkaline Phosphatase: 76 IU/L (ref 44–121)
BUN/Creatinine Ratio: 14 (ref 10–24)
BUN: 14 mg/dL (ref 8–27)
Bilirubin Total: 0.3 mg/dL (ref 0.0–1.2)
CO2: 27 mmol/L (ref 20–29)
Calcium: 10 mg/dL (ref 8.6–10.2)
Chloride: 103 mmol/L (ref 96–106)
Creatinine, Ser: 0.97 mg/dL (ref 0.76–1.27)
Globulin, Total: 2.1 g/dL (ref 1.5–4.5)
Glucose: 66 mg/dL — ABNORMAL LOW (ref 70–99)
Potassium: 4.2 mmol/L (ref 3.5–5.2)
Sodium: 143 mmol/L (ref 134–144)
Total Protein: 6.9 g/dL (ref 6.0–8.5)
eGFR: 84 mL/min/{1.73_m2} (ref >59)

## 2022-02-17 ENCOUNTER — Encounter: Payer: Self-pay | Admitting: Nurse Practitioner

## 2022-02-17 ENCOUNTER — Other Ambulatory Visit: Payer: Self-pay

## 2022-02-17 ENCOUNTER — Ambulatory Visit (INDEPENDENT_AMBULATORY_CARE_PROVIDER_SITE_OTHER): Payer: Medicare HMO | Admitting: Nurse Practitioner

## 2022-02-17 VITALS — BP 164/72 | HR 71 | Ht 62.0 in | Wt 215.8 lb

## 2022-02-17 DIAGNOSIS — I10 Essential (primary) hypertension: Secondary | ICD-10-CM

## 2022-02-17 DIAGNOSIS — E782 Mixed hyperlipidemia: Secondary | ICD-10-CM

## 2022-02-17 DIAGNOSIS — E559 Vitamin D deficiency, unspecified: Secondary | ICD-10-CM | POA: Diagnosis not present

## 2022-02-17 DIAGNOSIS — E1165 Type 2 diabetes mellitus with hyperglycemia: Secondary | ICD-10-CM

## 2022-02-17 LAB — POCT GLYCOSYLATED HEMOGLOBIN (HGB A1C): HbA1c, POC (controlled diabetic range): 8 % — AB (ref 0.0–7.0)

## 2022-02-17 MED ORDER — NOVOLIN 70/30 FLEXPEN (70-30) 100 UNIT/ML ~~LOC~~ SUPN: PEN_INJECTOR | SUBCUTANEOUS | 1 refills | Status: DC

## 2022-02-17 NOTE — Progress Notes (Signed)
02/17/2022   Endocrinology follow-up note   Subjective:    Patient ID: Patrick Taylor, male    DOB: 02-11-1951,    Past Medical History:  Diagnosis Date   Anemia    Asthma    Carpal tunnel syndrome    Chronic back pain    Colon polyps    COPD (chronic obstructive pulmonary disease) ([place])    DM (diabetes mellitus) ([place])    HTN (hypertension)    Hyperlipidemia    Past Surgical History:  Procedure Laterality Date   ABDOMINAL HERNIA REPAIR     CATARACT EXTRACTION W/PHACO Right 10/02/2021   Procedure: CATARACT EXTRACTION PHACO AND INTRAOCULAR LENS PLACEMENT ([place]);  Surgeon: Baruch Goldmann, MD;  Location: [facility];  Service: Ophthalmology;  Laterality: Right;  CDE: 4.57    CATARACT EXTRACTION W/PHACO Left 10/29/2021   Procedure: CATARACT EXTRACTION PHACO AND INTRAOCULAR LENS PLACEMENT LEFT EYE;  Surgeon: Baruch Goldmann, MD;  Location: [facility];  Service: Ophthalmology;  Laterality: Left;  CDE 8.51   COLONOSCOPY  01/24/09   rectal polyp/adenomatous poylps/tubulovillous   COLONOSCOPY  03/02/2012   Procedure: COLONOSCOPY;  Surgeon: Daneil Dolin, MD;  Location: [facility];  Service: Endoscopy;  Laterality: N/A;  11:00   COLONOSCOPY WITH PROPOFOL N/A 09/10/2021   Procedure: COLONOSCOPY WITH PROPOFOL;  Surgeon: Daneil Dolin, MD;  Location: [facility];  Service: Endoscopy;  Laterality: N/A;  8:45am   POLYPECTOMY  09/10/2021   Procedure: POLYPECTOMY;  Surgeon: Daneil Dolin, MD;  Location: [facility];  Service: Endoscopy;;   Social History   Socioeconomic History   Marital status: Married    Spouse name: Not on file   Number of children: Not on file   Years of education: Not on file   Highest education level: Not on file  Occupational History   Occupation: works at [employer]   Smoking status: Former    Packs/day: 1.00    Years: 20.00    Pack years: 20.00    Types: Cigarettes    Quit date: 02/18/1999    Years since quitting: 23.0   Smokeless tobacco:  Never  Vaping Use   Vaping Use: Never used  Substance and Sexual Activity   Alcohol use: No    Comment: no heavy use in past   Drug use: No   Sexual activity: Not on file  Other Topics Concern   Not on file  Social History Narrative   Not on file   Social Determinants of Health   Financial Resource Strain: Not on file  Food Insecurity: Not on file  Transportation Needs: Not on file  Physical Activity: Not on file  Stress: Not on file  Social Connections: Not on file   Outpatient Encounter Medications as of 02/17/2022  Medication Sig   albuterol (VENTOLIN HFA) 108 (90 Base) MCG/ACT inhaler Inhale 1-2 puffs into the lungs every 6 (six) hours as needed.   aspirin 81 MG tablet Take 81 mg by mouth once a week.   Blood Glucose Monitoring Suppl (ACCU-CHEK AVIVA PLUS) w/Device KIT Use as directed bid E11.65   Blood Glucose Monitoring Suppl (ONE TOUCH ULTRA 2) w/Device KIT Use to test blood glucose 4 times daily.   Budeson-Glycopyrrol-Formoterol (BREZTRI AEROSPHERE) 160-9-4.8 MCG/ACT AERO Inhale into the lungs.   calcium-vitamin D (OSCAL WITH D) 500-200 MG-UNIT tablet Take 1 tablet by mouth daily.   celecoxib (CELEBREX) 200 MG capsule Take 200 mg by mouth 2 (two) times daily as needed.   Cholecalciferol (  VITAMIN D3) 125 MCG (5000 UT) CAPS Take 5,000 Units by mouth daily.   cyclobenzaprine (FLEXERIL) 10 MG tablet Take 10 mg by mouth every 8 (eight) hours as needed for muscle spasms.   enalapril (VASOTEC) 20 MG tablet Take 1 tablet (20 mg total) by mouth daily.   furosemide (LASIX) 20 MG tablet Take 20 mg by mouth daily.    gabapentin (NEURONTIN) 300 MG capsule Take 300 mg by mouth 3 (three) times daily. 5 CAPS daily   glucose blood (ONETOUCH ULTRA) test strip USE 1 STRIP TO CHECK GLUCOSE 3 TIMES DAILY   HYDROcodone-acetaminophen (NORCO) 10-325 MG tablet Take 1 tablet by mouth 4 (four) times daily as needed.   Insulin Pen Needle (B-D UF III MINI PEN NEEDLES) 31G X 5 MM MISC USE FOR INSULIN  INJECTIONS 2 TIMES A DAY   Lancets (ONETOUCH ULTRASOFT) lancets Use as instructed to test blood glucose 4 times daily.   meclizine (ANTIVERT) 25 MG tablet Take 25 mg by mouth 3 (three) times daily as needed.   metFORMIN (GLUCOPHAGE) 500 MG tablet Take 2 tablets (1,000 mg total) by mouth 2 (two) times daily with a meal. Patient taking 2 538m tabs at breakfast and supper, 1 5031mtab at lunch (Patient taking differently: 2 tabs with breakfast,  2 at supper)   polyethylene glycol-electrolytes (NULYTELY) 420 g solution As directed   pravastatin (PRAVACHOL) 40 MG tablet Take 1 tablet (40 mg total) by mouth daily.   terbinafine (LAMISIL) 250 MG tablet Take 250 mg by mouth daily.   [DISCONTINUED] insulin isophane & regular human KwikPen (NOVOLIN 70/30 KWIKPEN) (70-30) 100 UNIT/ML KwikPen Inject 20 units with breakfast and 15 units with supper (if glucose is above 90 and he is eating).   insulin isophane & regular human KwikPen (NOVOLIN 70/30 KWIKPEN) (70-30) 100 UNIT/ML KwikPen Inject 25 units with breakfast and 20 units with supper (if glucose is above 90 and he is eating).   [DISCONTINUED] gabapentin (NEURONTIN) 300 MG capsule Take by mouth. (Patient not taking: Reported on 02/17/2022)   [DISCONTINUED] HYDROcodone-acetaminophen (NORCO/VICODIN) 5-325 MG tablet Take 1 tablet by mouth 2 (two) times daily as needed for moderate pain. (Patient not taking: Reported on 02/17/2022)   [DISCONTINUED] ILEVRO 0.3 % ophthalmic suspension PLEASE SEE ATTACHED FOR DETAILED DIRECTIONS (Patient not taking: Reported on 02/17/2022)   [DISCONTINUED] insulin aspart protamine - aspart (NOVOLOG 70/30 MIX) (70-30) 100 UNIT/ML FlexPen Inject 0.15-0.2 mLs (15-20 Units total) into the skin 2 (two) times daily with a meal. (Patient not taking: Reported on 02/17/2022)   [DISCONTINUED] moxifloxacin (VIGAMOX) 0.5 % ophthalmic solution PLEASE SEE ATTACHED FOR DETAILED DIRECTIONS (Patient not taking: Reported on 02/17/2022)   No  facility-administered encounter medications on file as of 02/17/2022.   ALLERGIES: No Known Allergies VACCINATION STATUS: Immunization History  Administered Date(s) Administered   Moderna Sars-Covid-2 Vaccination 02/29/2020, 04/02/2020    Diabetes He presents for his follow-up diabetic visit. He has type 2 diabetes mellitus. Onset time: He was diagnosed at approximate age of [age]. His disease course has been improving. There are no hypoglycemic associated symptoms. Pertinent negatives for hypoglycemia include no confusion, headaches, pallor or seizures. Pertinent negatives for diabetes include no chest pain, no fatigue, no polydipsia, no polyphagia, no polyuria and no weakness. There are no hypoglycemic complications. Symptoms are stable. Diabetic complications include nephropathy. Risk factors for coronary artery disease include dyslipidemia, diabetes mellitus, hypertension, sedentary lifestyle, male sex, obesity, tobacco exposure and family history. Current diabetic treatment includes insulin injections and oral agent (  monotherapy). He is compliant with treatment most of the time. His weight is fluctuating minimally. He is following a generally unhealthy diet. When asked about meal planning, he reported none. He has not had a previous visit with a dietitian. He rarely participates in exercise. His home blood glucose trend is fluctuating minimally. His breakfast blood glucose range is generally 110-130 mg/dl. His lunch blood glucose range is generally 140-180 mg/dl. His dinner blood glucose range is generally 140-180 mg/dl. (He presents today, accompanied by his wife, with his meter and logs showing at goal fasting and postprandial glycemic profile overall.  His POCT A1c today is 8%, increasing slightly from last visit of 7.8%.  His PCP increased his insulin after he started a new medication to help his breathing which caused hyperglycemia.  He denies any significant hypoglycemia, has skipped some  doses of premixed insulin due to lower readings (in the 90s).) An ACE inhibitor/angiotensin II receptor blocker is being taken. He does not see a podiatrist.Eye exam is current.  Hyperlipidemia This is a chronic problem. The current episode started more than 1 year ago. The problem is uncontrolled. Recent lipid tests were reviewed and are high. Exacerbating diseases include chronic renal disease, diabetes and obesity. There are no known factors aggravating his hyperlipidemia. Pertinent negatives include no chest pain, myalgias or shortness of breath. Current antihyperlipidemic treatment includes statins. The current treatment provides moderate improvement of lipids. Compliance problems include adherence to diet and adherence to exercise.  Risk factors for coronary artery disease include diabetes mellitus, male sex, a sedentary lifestyle, obesity, hypertension and dyslipidemia.  Hypertension This is a chronic problem. The current episode started more than 1 year ago. The problem has been gradually improving since onset. The problem is uncontrolled. Pertinent negatives include no chest pain, headaches, neck pain, palpitations or shortness of breath. There are no associated agents to hypertension. Risk factors for coronary artery disease include dyslipidemia, diabetes mellitus, obesity, male gender, sedentary lifestyle and smoking/tobacco exposure. Past treatments include ACE inhibitors and diuretics. The current treatment provides mild improvement. There are no compliance problems.  Identifiable causes of hypertension include chronic renal disease.    Review of systems  Constitutional: + Minimally fluctuating body weight,  current Body mass index is 39.47 kg/m. , no fatigue, no subjective hyperthermia, no subjective hypothermia Eyes: no blurry vision, no xerophthalmia ENT: no sore throat, no nodules palpated in throat, no dysphagia/odynophagia, no hoarseness Cardiovascular: no chest pain, no shortness of  breath, no palpitations, no leg swelling Respiratory: no cough, no shortness of breath Gastrointestinal: no nausea/vomiting/diarrhea Musculoskeletal: no muscle/joint aches Skin: no rashes, no hyperemia Neurological: no tremors, no numbness, no tingling, no dizziness Psychiatric: no depression, no anxiety   Objective:    BP (!) 164/72    Pulse 71    Ht _0  (1.575 m)    Wt 215 lb 12.8 oz (97.9 kg)    SpO2 99%    BMI 39.47 kg/m   Wt Readings from Last 3 Encounters:  02/17/22 215 lb 12.8 oz (97.9 kg)  10/29/21 213 lb 13.5 oz (97 kg)  10/23/21 213 lb 9.3 oz (96.9 kg)    BP Readings from Last 3 Encounters:  02/17/22 (!) 164/72  10/29/21 (!) 143/72  10/20/21 (!) 147/72      Physical Exam- Limited  Constitutional:  Body mass index is 39.47 kg/m. , not in acute distress, normal state of mind Eyes:  EOMI, no exophthalmos Neck: Supple Cardiovascular: RRR, no murmurs, rubs, or gallops, no edema Respiratory:  Adequate breathing efforts, no crackles, rales, rhonchi, or wheezing Musculoskeletal: no gross deformities, strength intact in all four extremities, no gross restriction of joint movements Skin:  no rashes, no hyperemia Neurological: no tremor with outstretched hands    Results for orders placed or performed in visit on 02/17/22  POCT glycosylated hemoglobin (Hb A1C)  Result Value Ref Range   Hemoglobin A1C     HbA1c POC (<> result, manual entry)     HbA1c, POC (prediabetic range)     HbA1c, POC (controlled diabetic range) 8.0 (A) 0.0 - 7.0 %  Diabetic Labs (most recent): Lab Results  Component Value Date   HGBA1C 8.0 (A) 02/17/2022   HGBA1C 7.8 (A) 10/20/2021   HGBA1C 8.4 (A) 06/17/2021    Lipid Panel     Component Value Date/Time   CHOL 174 06/10/2021 0824   TRIG 182 (H) 06/10/2021 0824   HDL 41 06/10/2021 0824   CHOLHDL 4.2 06/10/2021 0824   LDLCALC 101 (H) 06/10/2021 0824   CMP Latest Ref Rng & Units 02/15/2022 09/08/2021 06/10/2021  Glucose 70 - 99 mg/dL  66(L) 147(H) 151(H)  BUN 8 - 27 mg/dL _0 Creatinine 0.76 - 1.27 mg/dL 0.97 0.97 1.10  Sodium 134 - 144 mmol/L 143 138 140  Potassium 3.5 - 5.2 mmol/L 4.2 4.2 4.8  Chloride 96 - 106 mmol/L 103 102 100  CO2 20 - 29 mmol/L _1 Calcium 8.6 - 10.2 mg/dL 10.0 9.7 10.0  Total Protein 6.0 - 8.5 g/dL 6.9 - 6.9  Total Bilirubin 0.0 - 1.2 mg/dL 0.3 - 0.3  Alkaline Phos 44 - 121 IU/L 76 - 69  AST 0 - 40 IU/L 23 - 21  ALT 0 - 44 IU/L 12 - 8     Assessment & Plan:   1) Uncontrolled type 2 diabetes mellitus without complication, with long-term current use of insulin ([place])  He presents today, accompanied by his wife, with his meter and logs showing at goal fasting and postprandial glycemic profile overall.  His POCT A1c today is 8%, increasing slightly from last visit of 7.8%.  His PCP increased his insulin after he started a new medication to help his breathing which caused hyperglycemia.  He denies any significant hypoglycemia, has skipped some doses of premixed insulin due to lower readings (in the 90s).   Glucose logs and insulin administration records pertaining to this visit,  to be scanned into patient's records.  Recent labs reviewed.  - Patient remains at a high risk for more acute and chronic complications of diabetes which include CAD, CVA, CKD, retinopathy, and neuropathy. These are all discussed in detail with the patient.  - Nutritional counseling repeated at each appointment due to patients tendency to fall back in to old habits.  - The patient admits there is a room for improvement in their diet and drink choices. -  Suggestion is made for the patient to avoid simple carbohydrates from their diet including Cakes, Sweet Desserts / Pastries, Ice Cream, Soda (diet and regular), Sweet Tea, Candies, Chips, Cookies, Sweet Pastries, Store Bought Juices, Alcohol in Excess of 1-2 drinks a day, Artificial Sweeteners, Coffee Creamer, and "Sugar-free" Products. This will help patient to  have stable blood glucose profile and potentially avoid unintended weight gain.   - I encouraged the patient to switch to unprocessed or minimally processed complex starch and increased protein intake (animal or plant source), fruits, and vegetables.   - Patient is advised to stick to a  routine mealtimes to eat 3 meals a day and avoid unnecessary snacks (to snack only to correct hypoglycemia).  - I have approached patient with the following individualized plan to manage diabetes and patient agrees.  -He has responded very well to premixed insulin using Novolin 70/30.   -Given his stable glycemic profile,  he is advised to continue  his 70/30 at 25 units with breakfast and 20 units with supper (if glucose is above 90 and he is eating).   He is advised to continue Metformin 1000 mg po twice daily with meals.  -He is encouraged to monitor glucose at least 3 times daily, before injecting insulin (at breakfast and supper) and before bed, and call the clinic if he has readings less than 70 or greater than 200 for 3 days out of the week.  - Patient specific target  for A1c; LDL, HDL, Triglycerides, and  Waist Circumference were discussed in detail.  2) BP/HTN:  His blood pressure is not controlled to target however he had not yet taken his medications.  He is advised to continue Enalapril 20 mg po daily and Lasix 40 mg po daily.  He monitors his BP at home and says it is typically normal.    3) Lipids/HPL:  His most recent lipid panel from 06/10/21 shows uncontrolled LDL at 101 and elevated triglycerides of 182.  He is advised to increase his Pravastatin  to 40 mg po daily at bedtime (he may take 2 of his 20 mg tabs until he depletes his supply).  Side effects and precautions discussed with patient.  He has annual physical coming up with his PCP where lipids will be rechecked.  4)  Weight/Diet:  His Body mass index is 39.47 kg/m.--clearly complicating his diabetes care.  He is a candidate for modest  weight loss.  CDE consult in progress, exercise, and carbohydrates information provided.  5) Chronic Care/Health Maintenance: -Patient is on ACEI and Statin medications and encouraged to continue to follow up with Ophthalmology, Podiatrist at least yearly or according to recommendations, and advised to stay away from smoking. I have recommended yearly flu vaccine and pneumonia vaccination at least every 5 years; moderate intensity exercise for up to 150 minutes weekly; and  sleep for at least 7 hours a day.  6) Vitamin D deficiency -His recent vitamin D level was 31 on 06/10/21.  He is advised to continue OTC Vitamin D3 5000 units daily as maintenance dose, especially during the winter months.   I advised patient to maintain close follow up with his PCP for primary care needs.      I spent 30 minutes in the care of the patient today including review of labs from [place] [facility], Lipids, Thyroid Function, Hematology (current and previous including abstractions from other facilities); face-to-face time discussing  his blood glucose readings/logs, discussing hypoglycemia and hyperglycemia episodes and symptoms, medications doses, his options of short and long term treatment based on the latest standards of care / guidelines;  discussion about incorporating lifestyle medicine;  and documenting the encounter.    Please refer to Patient Instructions for Blood Glucose Monitoring and Insulin/Medications Dosing Guide"  in media tab for additional information. Please  also refer to " Patient Self Inventory" in the Media  tab for reviewed elements of pertinent patient history.  Patrick Taylor participated in the discussions, expressed understanding, and voiced agreement with the above plans.  All questions were answered to his satisfaction. he is encouraged to contact clinic should he have any  questions or concerns prior to his return visit.    Follow up plan: Return in about 4 months (around 06/19/2022) for  Diabetes F/U- A1c and UM in office, No previsit labs, Bring meter and logs.  Rayetta Pigg, [facility] [address] [place], [place] [address] Phone: [phone] Fax: [phone]  02/17/2022, 8:48 AM

## 2022-02-17 NOTE — Patient Instructions (Signed)
Diabetes Mellitus Emergency Preparedness Plan ?A diabetes emergency preparedness plan is a checklist to make sure you have everything you need to manage your diabetes in case of an emergency, such as an evacuation, natural disaster, national security emergency, or pandemic lockdown. ?Managing your diabetes is something you have to do all day every day. The American Diabetes Association and the [employer] of Endocrinology both recommend putting together an emergency diabetes kit. Your kit should include important information and documents as well as all the supplies you will need to manage your diabetes for at least 1 week. Store it in a portable, Engineer, materials. The best time to start making your emergency kit is now. ?How to make your emergency kit ?Collect information and documents ?Include the following information and documents in your kit: ?The type of diabetes you have. ?A copy of your health insurance cards and photo ID. ?A list of all your other medical conditions, allergies, and surgeries. ?A list of all your medicines and doses with the contact information for your pharmacy. Ask your health care provider for a list of your current medicines. ?Any recent lab results, including your latest hemoglobin A1C (HbA1C). ?The make, model, and serial number of your insulin pump, if you use one. Also include contact information for the manufacturer. ?Contact information for people who should be notified in case of an emergency. Include your health care provider's name, address, and phone number. ?Collect diabetes care items ?Include the following diabetes care items in your kit: ?At least a 1-week supply of: ?Oral medicines. ?Insulin. ?Blood glucose testing supplies. These include testing strips, lancets, and extra batteries for your blood glucose monitor and pump. ?A charger for the continuous glucose monitor (CGM) receiver and pump. ?Any extra supplies needed for your CGM or pump. ?A supply of  glucagon, glucose tablets, juice, soda, or hard candy in case of hypoglycemia. ?Coolers or cold packs. ?A safe container for syringes, needles, and lancets. ? ?Other preparations ?Other things to consider doing as part of your emergency plan: ?Make sure that your mobile phone is charged and that you have an extra charger, cable, or batteries. ?Choose a meeting place for family members. ?Wear a medical alert or ID bracelet. ?If you have a child with diabetes, make sure your child's school has a copy of his or her emergency plan, including the name of the staff member who will assist your child. ?Where to find more information ?American Diabetes Association: www.diabetes.org ?Centers for Disease Control and Prevention: blogs.[employer].com.cy ?Summary ?A diabetes emergency preparedness plan is a checklist to make sure you have everything you need in case of an emergency. ?Your kit should include important information and documents as well as all the supplies you will need to manage your condition for at least 1 week. ?Store your kit in a portable, Engineer, materials. ?The best time to start making your emergency kit is now. ?This information is not intended to replace advice given to you by your health care provider. Make sure you discuss any questions you have with your health care provider. ?Document Revised: 06/05/2020 Document Reviewed: 06/05/2020 ?Elsevier Patient Education ? [place]. ? ?

## 2022-02-23 DIAGNOSIS — E119 Type 2 diabetes mellitus without complications: Secondary | ICD-10-CM | POA: Diagnosis not present

## 2022-02-23 DIAGNOSIS — Z79899 Other long term (current) drug therapy: Secondary | ICD-10-CM | POA: Diagnosis not present

## 2022-02-23 DIAGNOSIS — I10 Essential (primary) hypertension: Secondary | ICD-10-CM | POA: Diagnosis not present

## 2022-02-23 DIAGNOSIS — Z125 Encounter for screening for malignant neoplasm of prostate: Secondary | ICD-10-CM | POA: Diagnosis not present

## 2022-03-09 DIAGNOSIS — Z0100 Encounter for examination of eyes and vision without abnormal findings: Secondary | ICD-10-CM | POA: Diagnosis not present

## 2022-03-09 DIAGNOSIS — H52229 Regular astigmatism, unspecified eye: Secondary | ICD-10-CM | POA: Diagnosis not present

## 2022-04-21 DIAGNOSIS — E1140 Type 2 diabetes mellitus with diabetic neuropathy, unspecified: Secondary | ICD-10-CM | POA: Diagnosis not present

## 2022-04-21 DIAGNOSIS — I10 Essential (primary) hypertension: Secondary | ICD-10-CM | POA: Diagnosis not present

## 2022-04-21 DIAGNOSIS — G894 Chronic pain syndrome: Secondary | ICD-10-CM | POA: Diagnosis not present

## 2022-04-21 DIAGNOSIS — J449 Chronic obstructive pulmonary disease, unspecified: Secondary | ICD-10-CM | POA: Diagnosis not present

## 2022-05-03 ENCOUNTER — Other Ambulatory Visit: Payer: Self-pay

## 2022-05-03 MED ORDER — METFORMIN HCL 500 MG PO TABS: 1000.0000 mg | ORAL_TABLET | Freq: Two times a day (BID) | ORAL | 0 refills | Status: DC

## 2022-05-05 ENCOUNTER — Other Ambulatory Visit: Payer: Self-pay | Admitting: Nurse Practitioner

## 2022-05-05 MED ORDER — METFORMIN HCL 500 MG PO TABS: 1000.0000 mg | ORAL_TABLET | Freq: Two times a day (BID) | ORAL | 3 refills | Status: DC

## 2022-05-29 ENCOUNTER — Other Ambulatory Visit: Payer: Self-pay | Admitting: Nurse Practitioner

## 2022-06-22 ENCOUNTER — Encounter: Payer: Self-pay | Admitting: Nurse Practitioner

## 2022-06-22 ENCOUNTER — Ambulatory Visit (INDEPENDENT_AMBULATORY_CARE_PROVIDER_SITE_OTHER): Payer: Medicare HMO | Admitting: Nurse Practitioner

## 2022-06-22 VITALS — BP 141/81 | HR 54 | Ht 62.0 in | Wt 213.0 lb

## 2022-06-22 DIAGNOSIS — E782 Mixed hyperlipidemia: Secondary | ICD-10-CM | POA: Diagnosis not present

## 2022-06-22 DIAGNOSIS — I10 Essential (primary) hypertension: Secondary | ICD-10-CM | POA: Diagnosis not present

## 2022-06-22 DIAGNOSIS — E1165 Type 2 diabetes mellitus with hyperglycemia: Secondary | ICD-10-CM | POA: Diagnosis not present

## 2022-06-22 DIAGNOSIS — E559 Vitamin D deficiency, unspecified: Secondary | ICD-10-CM | POA: Diagnosis not present

## 2022-06-22 LAB — POCT GLYCOSYLATED HEMOGLOBIN (HGB A1C): HbA1c POC (<> result, manual entry): 7.9 % (ref 4.0–5.6)

## 2022-06-22 MED ORDER — METFORMIN HCL 500 MG PO TABS: 1000.0000 mg | ORAL_TABLET | Freq: Two times a day (BID) | ORAL | 3 refills | Status: DC

## 2022-06-22 MED ORDER — NOVOLOG MIX 70/30 FLEXPEN (70-30) 100 UNIT/ML ~~LOC~~ SUPN: PEN_INJECTOR | SUBCUTANEOUS | 0 refills | Status: DC

## 2022-06-22 NOTE — Progress Notes (Signed)
06/22/2022   Endocrinology follow-up note   Subjective:    Patient ID: Patrick Taylor, male    DOB: 1951/09/10,    Past Medical History:  Diagnosis Date   Anemia    Asthma    Carpal tunnel syndrome    Chronic back pain    Colon polyps    COPD (chronic obstructive pulmonary disease) ([place])    DM (diabetes mellitus) ([place])    HTN (hypertension)    Hyperlipidemia    Past Surgical History:  Procedure Laterality Date   ABDOMINAL HERNIA REPAIR     CATARACT EXTRACTION W/PHACO Right 10/02/2021   Procedure: CATARACT EXTRACTION PHACO AND INTRAOCULAR LENS PLACEMENT ([place]);  Surgeon: Baruch Goldmann, MD;  Location: [facility];  Service: Ophthalmology;  Laterality: Right;  CDE: 4.57    CATARACT EXTRACTION W/PHACO Left 10/29/2021   Procedure: CATARACT EXTRACTION PHACO AND INTRAOCULAR LENS PLACEMENT LEFT EYE;  Surgeon: Baruch Goldmann, MD;  Location: [facility];  Service: Ophthalmology;  Laterality: Left;  CDE 8.51   COLONOSCOPY  01/24/09   rectal polyp/adenomatous poylps/tubulovillous   COLONOSCOPY  03/02/2012   Procedure: COLONOSCOPY;  Surgeon: Daneil Dolin, MD;  Location: [facility];  Service: Endoscopy;  Laterality: N/A;  11:00   COLONOSCOPY WITH PROPOFOL N/A 09/10/2021   Procedure: COLONOSCOPY WITH PROPOFOL;  Surgeon: Daneil Dolin, MD;  Location: [facility];  Service: Endoscopy;  Laterality: N/A;  8:45am   POLYPECTOMY  09/10/2021   Procedure: POLYPECTOMY;  Surgeon: Daneil Dolin, MD;  Location: [facility];  Service: Endoscopy;;   Social History   Socioeconomic History   Marital status: Married    Spouse name: Not on file   Number of children: Not on file   Years of education: Not on file   Highest education level: Not on file  Occupational History   Occupation: works at [employer] Use   Smoking status: Former    Packs/day: 1.00    Years: 20.00    Total pack years: 20.00    Types: Cigarettes    Quit date: 02/18/1999    Years since quitting: 23.3   Smokeless  tobacco: Never  Vaping Use   Vaping Use: Never used  Substance and Sexual Activity   Alcohol use: No    Comment: no heavy use in past   Drug use: No   Sexual activity: Not on file  Other Topics Concern   Not on file  Social History Narrative   Not on file   Social Determinants of Health   Financial Resource Strain: Not on file  Food Insecurity: Not on file  Transportation Needs: Not on file  Physical Activity: Not on file  Stress: Not on file  Social Connections: Not on file   Outpatient Encounter Medications as of 06/22/2022  Medication Sig   albuterol (VENTOLIN HFA) 108 (90 Base) MCG/ACT inhaler Inhale 1-2 puffs into the lungs every 6 (six) hours as needed.   aspirin 81 MG tablet Take 81 mg by mouth once a week.   Blood Glucose Monitoring Suppl (ACCU-CHEK AVIVA PLUS) w/Device KIT Use as directed bid E11.65   Blood Glucose Monitoring Suppl (ONE TOUCH ULTRA 2) w/Device KIT Use to test blood glucose 4 times daily.   Budeson-Glycopyrrol-Formoterol (BREZTRI AEROSPHERE) 160-9-4.8 MCG/ACT AERO Inhale into the lungs.   calcium-vitamin D (OSCAL WITH D) 500-200 MG-UNIT tablet Take 1 tablet by mouth daily.   celecoxib (CELEBREX) 200 MG capsule Take 200 mg by mouth 2 (two) times daily as needed.  Cholecalciferol (VITAMIN D3) 125 MCG (5000 UT) CAPS Take 5,000 Units by mouth daily.   cyclobenzaprine (FLEXERIL) 10 MG tablet Take 10 mg by mouth every 8 (eight) hours as needed for muscle spasms.   enalapril (VASOTEC) 20 MG tablet Take 1 tablet (20 mg total) by mouth daily.   furosemide (LASIX) 20 MG tablet Take 20 mg by mouth daily.    gabapentin (NEURONTIN) 300 MG capsule Take 300 mg by mouth 3 (three) times daily. 5 CAPS daily   glucose blood (ONETOUCH ULTRA) test strip USE 1 STRIP TO CHECK GLUCOSE 3 TIMES DAILY   HYDROcodone-acetaminophen (NORCO) 10-325 MG tablet Take 1 tablet by mouth 4 (four) times daily as needed.   Insulin Pen Needle (B-D UF III MINI PEN NEEDLES) 31G X 5 MM MISC USE FOR  INSULIN INJECTIONS 2 TIMES A DAY   Lancets (ONETOUCH ULTRASOFT) lancets Use as instructed to test blood glucose 4 times daily.   meclizine (ANTIVERT) 25 MG tablet Take 25 mg by mouth 3 (three) times daily as needed.   metFORMIN (GLUCOPHAGE) 500 MG tablet Take 2 tablets (1,000 mg total) by mouth 2 (two) times daily with a meal.   NOVOLOG MIX 70/30 FLEXPEN (70-30) 100 UNIT/ML FlexPen Inj 22 units qam & 18 units qhs   polyethylene glycol-electrolytes (NULYTELY) 420 g solution As directed   pravastatin (PRAVACHOL) 40 MG tablet Take 1 tablet (40 mg total) by mouth daily.   terbinafine (LAMISIL) 250 MG tablet Take 250 mg by mouth daily.   [DISCONTINUED] insulin isophane & regular human KwikPen (NOVOLIN 70/30 KWIKPEN) (70-30) 100 UNIT/ML KwikPen Inject 25 units with breakfast and 20 units with supper (if glucose is above 90 and he is eating).   [DISCONTINUED] metFORMIN (GLUCOPHAGE) 500 MG tablet Take 2 tablets (1,000 mg total) by mouth 2 (two) times daily with a meal.   [DISCONTINUED] NOVOLOG MIX 70/30 FLEXPEN (70-30) 100 UNIT/ML FlexPen Inj 25 units qam & 20 units qhs   No facility-administered encounter medications on file as of 06/22/2022.   ALLERGIES: No Known Allergies VACCINATION STATUS: Immunization History  Administered Date(s) Administered   Moderna Sars-Covid-2 Vaccination 02/29/2020, 04/02/2020    Diabetes He presents for his follow-up diabetic visit. He has type 2 diabetes mellitus. Onset time: He was diagnosed at approximate age of 26 years. His disease course has been improving. Hypoglycemia symptoms include sweats and tremors. Pertinent negatives for hypoglycemia include no confusion, headaches, pallor or seizures. Pertinent negatives for diabetes include no chest pain, no fatigue, no polydipsia, no polyphagia, no polyuria and no weakness. There are no hypoglycemic complications. Symptoms are stable. Diabetic complications include nephropathy. Risk factors for coronary artery disease  include dyslipidemia, diabetes mellitus, hypertension, sedentary lifestyle, male sex, obesity, tobacco exposure and family history. Current diabetic treatment includes insulin injections and oral agent (monotherapy). He is compliant with treatment most of the time. His weight is fluctuating minimally. He is following a generally unhealthy diet. When asked about meal planning, he reported none. He has not had a previous visit with a dietitian. He rarely participates in exercise. His home blood glucose trend is decreasing steadily. (He presents today, accompanied by his wife, with his readings showing at target glycemic profile overall.  His POCT A1c today is 7.9%, improving slightly from last visit of 8%.  He has missed several injections of his premixed insulin due to glucose less than 90.  He denies any significant hypoglycemia events though.) An ACE inhibitor/angiotensin II receptor blocker is being taken. He does not see a  podiatrist.Eye exam is current.  Hyperlipidemia This is a chronic problem. The current episode started more than 1 year ago. The problem is uncontrolled. Recent lipid tests were reviewed and are high. Exacerbating diseases include chronic renal disease, diabetes and obesity. There are no known factors aggravating his hyperlipidemia. Pertinent negatives include no chest pain, myalgias or shortness of breath. Current antihyperlipidemic treatment includes statins. The current treatment provides moderate improvement of lipids. Compliance problems include adherence to diet and adherence to exercise.  Risk factors for coronary artery disease include diabetes mellitus, male sex, a sedentary lifestyle, obesity, hypertension and dyslipidemia.  Hypertension This is a chronic problem. The current episode started more than 1 year ago. The problem has been gradually improving since onset. The problem is uncontrolled. Associated symptoms include sweats. Pertinent negatives include no chest pain,  headaches, neck pain, palpitations or shortness of breath. There are no associated agents to hypertension. Risk factors for coronary artery disease include dyslipidemia, diabetes mellitus, obesity, male gender, sedentary lifestyle and smoking/tobacco exposure. Past treatments include ACE inhibitors and diuretics. The current treatment provides mild improvement. There are no compliance problems.  Identifiable causes of hypertension include chronic renal disease.    Review of systems  Constitutional: + Minimally fluctuating body weight,  current Body mass index is 38.96 kg/m. , no fatigue, no subjective hyperthermia, no subjective hypothermia Eyes: no blurry vision, no xerophthalmia ENT: no sore throat, no nodules palpated in throat, no dysphagia/odynophagia, no hoarseness Cardiovascular: no chest pain, no shortness of breath, no palpitations, no leg swelling Respiratory: no cough, no shortness of breath Gastrointestinal: no nausea/vomiting/diarrhea Musculoskeletal: no muscle/joint aches Skin: no rashes, no hyperemia Neurological: no tremors, no numbness, no tingling, no dizziness Psychiatric: no depression, no anxiety   Objective:    BP (!) 141/81   Pulse (!) 54   Ht '5\' 2"'  (1.575 m)   Wt 213 lb (96.6 kg)   BMI 38.96 kg/m   Wt Readings from Last 3 Encounters:  06/22/22 213 lb (96.6 kg)  02/17/22 215 lb 12.8 oz (97.9 kg)  10/29/21 213 lb 13.5 oz (97 kg)    BP Readings from Last 3 Encounters:  06/22/22 (!) 141/81  02/17/22 (!) 164/72  10/29/21 (!) 143/72     Physical Exam- Limited  Constitutional:  Body mass index is 38.96 kg/m. , not in acute distress, normal state of mind Eyes:  EOMI, no exophthalmos Neck: Supple Cardiovascular: RRR, no murmurs, rubs, or gallops, no edema Respiratory: Adequate breathing efforts, no crackles, rales, rhonchi, or wheezing Musculoskeletal: no gross deformities, strength intact in all four extremities, no gross restriction of joint  movements Skin:  no rashes, no hyperemia Neurological: no tremor with outstretched hands   Diabetic Foot Exam - Simple   Simple Foot Form Diabetic Foot exam was performed with the following findings: Yes 06/22/2022  8:30 AM  Visual Inspection See comments: Yes Sensation Testing Intact to touch and monofilament testing bilaterally: Yes Pulse Check Posterior Tibialis and Dorsalis pulse intact bilaterally: Yes Comments Bilateral bunions     Results for orders placed or performed in visit on 06/22/22  HgB A1c  Result Value Ref Range   Hemoglobin A1C     HbA1c POC (<> result, manual entry) 7.9 4.0 - 5.6 %   HbA1c, POC (prediabetic range)     HbA1c, POC (controlled diabetic range)    Diabetic Labs (most recent): Lab Results  Component Value Date   HGBA1C 7.9 06/22/2022   HGBA1C 8.0 (A) 02/17/2022   HGBA1C 7.8 (A)  10/20/2021   MICROALBUR 10 06/17/2021   MICROALBUR <3.0 06/05/2020    Lipid Panel     Component Value Date/Time   CHOL 174 06/10/2021 0824   TRIG 182 (H) 06/10/2021 0824   HDL 41 06/10/2021 0824   CHOLHDL 4.2 06/10/2021 0824   LDLCALC 101 (H) 06/10/2021 0824      Latest Ref Rng & Units 02/15/2022    8:15 AM 09/08/2021   10:15 AM 06/10/2021    8:24 AM  CMP  Glucose 70 - 99 mg/dL 66  147  151   BUN 8 - 27 mg/dL '14  18  19   ' Creatinine 0.76 - 1.27 mg/dL 0.97  0.97  1.10   Sodium 134 - 144 mmol/L 143  138  140   Potassium 3.5 - 5.2 mmol/L 4.2  4.2  4.8   Chloride 96 - 106 mmol/L 103  102  100   CO2 20 - 29 mmol/L '27  29  24   ' Calcium 8.6 - 10.2 mg/dL 10.0  9.7  10.0   Total Protein 6.0 - 8.5 g/dL 6.9   6.9   Total Bilirubin 0.0 - 1.2 mg/dL 0.3   0.3   Alkaline Phos 44 - 121 IU/L 76   69   AST 0 - 40 IU/L 23   21   ALT 0 - 44 IU/L 12   8      Assessment & Plan:   1) Uncontrolled type 2 diabetes mellitus without complication, with long-term current use of insulin ([place])  He presents today, accompanied by his wife, with his readings showing at target  glycemic profile overall.  His POCT A1c today is 7.9%, improving slightly from last visit of 8%.  He has missed several injections of his premixed insulin due to glucose less than 90.  He denies any significant hypoglycemia events though.   Glucose logs and insulin administration records pertaining to this visit,  to be scanned into patient's records.  Recent labs reviewed.  - Patient remains at a high risk for more acute and chronic complications of diabetes which include CAD, CVA, CKD, retinopathy, and neuropathy. These are all discussed in detail with the patient.  - Nutritional counseling repeated at each appointment due to patients tendency to fall back in to old habits.  - The patient admits there is a room for improvement in their diet and drink choices. -  Suggestion is made for the patient to avoid simple carbohydrates from their diet including Cakes, Sweet Desserts / Pastries, Ice Cream, Soda (diet and regular), Sweet Tea, Candies, Chips, Cookies, Sweet Pastries, Store Bought Juices, Alcohol in Excess of 1-2 drinks a day, Artificial Sweeteners, Coffee Creamer, and "Sugar-free" Products. This will help patient to have stable blood glucose profile and potentially avoid unintended weight gain.   - I encouraged the patient to switch to unprocessed or minimally processed complex starch and increased protein intake (animal or plant source), fruits, and vegetables.   - Patient is advised to stick to a routine mealtimes to eat 3 meals a day and avoid unnecessary snacks (to snack only to correct hypoglycemia).  - I have approached patient with the following individualized plan to manage diabetes and patient agrees.  -He has responded very well to premixed insulin using Novolin 70/30.   -Given his tightening glycemic profile,  he is advised to lower his 70/30 at 22 units with breakfast and 18 units with supper (if glucose is above 90 and he is eating).   He is  advised to continue Metformin 1000 mg  po twice daily with meals.  -He is encouraged to monitor glucose at least 3 times daily, before injecting insulin (at breakfast and supper) and before bed, and call the clinic if he has readings less than 70 or greater than 200 for 3 days out of the week.  - Patient specific target  for A1c; LDL, HDL, Triglycerides, and  Waist Circumference were discussed in detail.  2) BP/HTN:  His blood pressure is controlled to target for his age.  He is advised to continue Enalapril 20 mg po daily and Lasix 20 mg po daily.      3) Lipids/HPL:  His most recent lipid panel from 02/23/22 shows controlled LDL at 97 and elevated triglycerides of 175.  He is advised to continue his Pravastatin 40 mg po daily at bedtime (he may take 2 of his 20 mg tabs until he depletes his supply).  Side effects and precautions discussed with patient.    4)  Weight/Diet:  His Body mass index is 38.96 kg/m.--clearly complicating his diabetes care.  He is a candidate for modest weight loss.  CDE consult in progress, exercise, and carbohydrates information provided.  5) Chronic Care/Health Maintenance: -Patient is on ACEI and Statin medications and encouraged to continue to follow up with Ophthalmology, Podiatrist at least yearly or according to recommendations, and advised to stay away from smoking. I have recommended yearly flu vaccine and pneumonia vaccination at least every 5 years; moderate intensity exercise for up to 150 minutes weekly; and  sleep for at least 7 hours a day.  6) Vitamin D deficiency -His recent vitamin D level was 31 on 06/10/21.  He is advised to continue OTC Vitamin D3 5000 units daily as maintenance dose.  He would like to come off of this medication in the future due to cost.  Will recheck vitamin D prior to next visit.   I advised patient to maintain close follow up with his PCP for primary care needs.       I spent 40 minutes in the care of the patient today including review of labs from [place],  Lipids, Thyroid Function, Hematology (current and previous including abstractions from other facilities); face-to-face time discussing  his blood glucose readings/logs, discussing hypoglycemia and hyperglycemia episodes and symptoms, medications doses, his options of short and long term treatment based on the latest standards of care / guidelines;  discussion about incorporating lifestyle medicine;  and documenting the encounter. Risk reduction counseling performed per USPSTF guidelines to reduce obesity and cardiovascular risk factors.     Please refer to Patient Instructions for Blood Glucose Monitoring and Insulin/Medications Dosing Guide"  in media tab for additional information. Please  also refer to " Patient Self Inventory" in the Media  tab for reviewed elements of pertinent patient history.  Patrick Taylor participated in the discussions, expressed understanding, and voiced agreement with the above plans.  All questions were answered to his satisfaction. he is encouraged to contact clinic should he have any questions or concerns prior to his return visit.    Follow up plan: Return in about 4 months (around 10/23/2022) for Diabetes F/U with A1c in office, Previsit labs, Bring meter and logs.  Rayetta Pigg, [facility] [address] [place], [place] [address] Phone: [phone] Fax: [phone]  06/22/2022, 8:33 AM

## 2022-07-17 ENCOUNTER — Other Ambulatory Visit: Payer: Self-pay | Admitting: Nurse Practitioner

## 2022-08-17 ENCOUNTER — Other Ambulatory Visit: Payer: Self-pay | Admitting: Nurse Practitioner

## 2022-08-23 ENCOUNTER — Telehealth: Payer: Self-pay | Admitting: Nurse Practitioner

## 2022-08-23 MED ORDER — BLOOD GLUCOSE MONITOR KIT: PACK | 0 refills | Status: AC

## 2022-08-23 MED ORDER — BLOOD GLUCOSE MONITOR KIT: PACK | 0 refills | Status: DC

## 2022-08-23 NOTE — Telephone Encounter (Signed)
Pt said one touch ultra strips are not covered. I asked him to call his insurance to see what is covered then let us know

## 2022-08-23 NOTE — Telephone Encounter (Signed)
I couldn't find this meter/strips.  Is he sure this is what is covered?

## 2022-08-23 NOTE — Telephone Encounter (Signed)
Error

## 2022-08-23 NOTE — Addendum Note (Signed)
Addended by: Brita Romp on: 08/23/2022 12:53 PM   Modules accepted: Orders

## 2022-08-23 NOTE — Telephone Encounter (Signed)
done

## 2022-08-23 NOTE — Addendum Note (Signed)
Addended by: Brita Romp on: 08/23/2022 01:42 PM   Modules accepted: Orders

## 2022-08-23 NOTE — Telephone Encounter (Signed)
Pt said that his insurance will pay for One touch fast ultra with zero copay. Please send to [facility]

## 2022-08-23 NOTE — Telephone Encounter (Signed)
F/u [facility] calling need Dx Code blood glucose meter kit and supplies KIT

## 2022-08-23 NOTE — Telephone Encounter (Signed)
I have tried doing it in the past and didn't have any success with it, but I'd be willing to try again.  I sent the script to both pharmacies he has listed on his chart

## 2022-09-20 ENCOUNTER — Other Ambulatory Visit: Payer: Self-pay | Admitting: Nurse Practitioner

## 2022-09-23 DIAGNOSIS — G894 Chronic pain syndrome: Secondary | ICD-10-CM | POA: Diagnosis not present

## 2022-09-23 DIAGNOSIS — E663 Overweight: Secondary | ICD-10-CM | POA: Diagnosis not present

## 2022-10-08 ENCOUNTER — Other Ambulatory Visit: Payer: Self-pay

## 2022-10-08 DIAGNOSIS — E1165 Type 2 diabetes mellitus with hyperglycemia: Secondary | ICD-10-CM

## 2022-10-08 MED ORDER — BD PEN NEEDLE MINI U/F 31G X 5 MM MISC: 5 refills | Status: DC

## 2022-10-15 DIAGNOSIS — E782 Mixed hyperlipidemia: Secondary | ICD-10-CM | POA: Diagnosis not present

## 2022-10-15 DIAGNOSIS — Z794 Long term (current) use of insulin: Secondary | ICD-10-CM | POA: Diagnosis not present

## 2022-10-15 DIAGNOSIS — G894 Chronic pain syndrome: Secondary | ICD-10-CM | POA: Diagnosis not present

## 2022-10-15 DIAGNOSIS — Z6839 Body mass index (BMI) 39.0-39.9, adult: Secondary | ICD-10-CM | POA: Diagnosis not present

## 2022-10-15 DIAGNOSIS — I10 Essential (primary) hypertension: Secondary | ICD-10-CM | POA: Diagnosis not present

## 2022-10-15 DIAGNOSIS — J449 Chronic obstructive pulmonary disease, unspecified: Secondary | ICD-10-CM | POA: Diagnosis not present

## 2022-10-16 ENCOUNTER — Other Ambulatory Visit: Payer: Self-pay | Admitting: Nurse Practitioner

## 2022-10-18 DIAGNOSIS — E1165 Type 2 diabetes mellitus with hyperglycemia: Secondary | ICD-10-CM | POA: Diagnosis not present

## 2022-10-18 DIAGNOSIS — E559 Vitamin D deficiency, unspecified: Secondary | ICD-10-CM | POA: Diagnosis not present

## 2022-10-19 LAB — COMPREHENSIVE METABOLIC PANEL
ALT: 7 IU/L (ref 0–44)
AST: 23 IU/L (ref 0–40)
Albumin/Globulin Ratio: 1.7 (ref 1.2–2.2)
Albumin: 4.5 g/dL (ref 3.9–4.9)
Alkaline Phosphatase: 64 IU/L (ref 44–121)
BUN/Creatinine Ratio: 15 (ref 10–24)
BUN: 15 mg/dL (ref 8–27)
Bilirubin Total: 0.3 mg/dL (ref 0.0–1.2)
CO2: 25 mmol/L (ref 20–29)
Calcium: 10.1 mg/dL (ref 8.6–10.2)
Chloride: 104 mmol/L (ref 96–106)
Creatinine, Ser: 1.03 mg/dL (ref 0.76–1.27)
Globulin, Total: 2.6 g/dL (ref 1.5–4.5)
Glucose: 64 mg/dL — ABNORMAL LOW (ref 70–99)
Potassium: 4.5 mmol/L (ref 3.5–5.2)
Sodium: 145 mmol/L — ABNORMAL HIGH (ref 134–144)
Total Protein: 7.1 g/dL (ref 6.0–8.5)
eGFR: 78 mL/min/{1.73_m2} (ref >59)

## 2022-10-19 LAB — VITAMIN D 25 HYDROXY (VIT D DEFICIENCY, FRACTURES): Vit D, 25-Hydroxy: 66.8 ng/mL (ref 30.0–100.0)

## 2022-10-26 ENCOUNTER — Ambulatory Visit (INDEPENDENT_AMBULATORY_CARE_PROVIDER_SITE_OTHER): Payer: Medicare HMO | Admitting: Nurse Practitioner

## 2022-10-26 ENCOUNTER — Encounter: Payer: Self-pay | Admitting: Nurse Practitioner

## 2022-10-26 VITALS — BP 132/78 | HR 73 | Ht 62.0 in | Wt 213.8 lb

## 2022-10-26 DIAGNOSIS — E1165 Type 2 diabetes mellitus with hyperglycemia: Secondary | ICD-10-CM | POA: Diagnosis not present

## 2022-10-26 DIAGNOSIS — E782 Mixed hyperlipidemia: Secondary | ICD-10-CM

## 2022-10-26 DIAGNOSIS — E559 Vitamin D deficiency, unspecified: Secondary | ICD-10-CM

## 2022-10-26 DIAGNOSIS — I10 Essential (primary) hypertension: Secondary | ICD-10-CM

## 2022-10-26 LAB — POCT GLYCOSYLATED HEMOGLOBIN (HGB A1C): Hemoglobin A1C: 7.5 % — AB (ref 4.0–5.6)

## 2022-10-26 MED ORDER — NOVOLOG MIX 70/30 FLEXPEN (70-30) 100 UNIT/ML ~~LOC~~ SUPN: PEN_INJECTOR | SUBCUTANEOUS | 0 refills | Status: DC

## 2022-10-26 NOTE — Progress Notes (Signed)
10/26/2022   Endocrinology follow-up note   Subjective:    Patient ID: Patrick Taylor, male    DOB: 1951/09/18,    Past Medical History:  Diagnosis Date   Anemia    Asthma    Carpal tunnel syndrome    Chronic back pain    Colon polyps    COPD (chronic obstructive pulmonary disease) ([place])    DM (diabetes mellitus) ([place])    HTN (hypertension)    Hyperlipidemia    Past Surgical History:  Procedure Laterality Date   ABDOMINAL HERNIA REPAIR     CATARACT EXTRACTION W/PHACO Right 10/02/2021   Procedure: CATARACT EXTRACTION PHACO AND INTRAOCULAR LENS PLACEMENT ([place]);  Surgeon: Baruch Goldmann, MD;  Location: [facility];  Service: Ophthalmology;  Laterality: Right;  CDE: 4.57    CATARACT EXTRACTION W/PHACO Left 10/29/2021   Procedure: CATARACT EXTRACTION PHACO AND INTRAOCULAR LENS PLACEMENT LEFT EYE;  Surgeon: Baruch Goldmann, MD;  Location: [facility];  Service: Ophthalmology;  Laterality: Left;  CDE 8.51   COLONOSCOPY  01/24/09   rectal polyp/adenomatous poylps/tubulovillous   COLONOSCOPY  03/02/2012   Procedure: COLONOSCOPY;  Surgeon: Daneil Dolin, MD;  Location: [facility];  Service: Endoscopy;  Laterality: N/A;  11:00   COLONOSCOPY WITH PROPOFOL N/A 09/10/2021   Procedure: COLONOSCOPY WITH PROPOFOL;  Surgeon: Daneil Dolin, MD;  Location: [facility];  Service: Endoscopy;  Laterality: N/A;  8:45am   POLYPECTOMY  09/10/2021   Procedure: POLYPECTOMY;  Surgeon: Daneil Dolin, MD;  Location: [facility];  Service: Endoscopy;;   Social History   Socioeconomic History   Marital status: Married    Spouse name: Not on file   Number of children: Not on file   Years of education: Not on file   Highest education level: Not on file  Occupational History   Occupation: works at [employer] Use   Smoking status: Former    Packs/day: 1.00    Years: 20.00    Total pack years: 20.00    Types: Cigarettes    Quit date: 02/18/1999    Years since quitting: 23.7   Smokeless  tobacco: Never  Vaping Use   Vaping Use: Never used  Substance and Sexual Activity   Alcohol use: No    Comment: no heavy use in past   Drug use: No   Sexual activity: Not on file  Other Topics Concern   Not on file  Social History Narrative   Not on file   Social Determinants of Health   Financial Resource Strain: Not on file  Food Insecurity: Not on file  Transportation Needs: Not on file  Physical Activity: Not on file  Stress: Not on file  Social Connections: Not on file   Outpatient Encounter Medications as of 10/26/2022  Medication Sig   albuterol (VENTOLIN HFA) 108 (90 Base) MCG/ACT inhaler Inhale 1-2 puffs into the lungs every 6 (six) hours as needed.   aspirin 81 MG tablet Take 81 mg by mouth once a week.   blood glucose meter kit and supplies KIT Dispense based on patient and insurance preference. Use 3 times daily as directed (before breakfast, before supper, before bed). Dx e11.9   Blood Glucose Monitoring Suppl (ACCU-CHEK AVIVA PLUS) w/Device KIT Use as directed bid E11.65   Blood Glucose Monitoring Suppl (ONE TOUCH ULTRA 2) w/Device KIT Use to test blood glucose 4 times daily.   Budeson-Glycopyrrol-Formoterol (BREZTRI AEROSPHERE) 160-9-4.8 MCG/ACT AERO Inhale into the lungs.   calcium-vitamin D (OSCAL WITH  D) 500-200 MG-UNIT tablet Take 1 tablet by mouth daily.   celecoxib (CELEBREX) 200 MG capsule Take 200 mg by mouth 2 (two) times daily as needed.   Cholecalciferol (VITAMIN D3) 125 MCG (5000 UT) CAPS Take 5,000 Units by mouth daily.   cyclobenzaprine (FLEXERIL) 10 MG tablet Take 10 mg by mouth every 8 (eight) hours as needed for muscle spasms.   enalapril (VASOTEC) 20 MG tablet Take 1 tablet (20 mg total) by mouth daily.   furosemide (LASIX) 20 MG tablet Take 20 mg by mouth daily.    gabapentin (NEURONTIN) 300 MG capsule Take 300 mg by mouth 3 (three) times daily. 5 CAPS daily   glucose blood (ONETOUCH ULTRA) test strip USE AS INSTRUCTED TO TEST BLOOD GLUCOSE FOUR  TIMES DAILY E11.65   HYDROcodone-acetaminophen (NORCO) 10-325 MG tablet Take 1 tablet by mouth 4 (four) times daily as needed.   Insulin Pen Needle (B-D UF III MINI PEN NEEDLES) 31G X 5 MM MISC USE FOR INSULIN INJECTIONS 2 TIMES A DAY   Lancets (ONETOUCH ULTRASOFT) lancets Use as instructed to test blood glucose 4 times daily.   meclizine (ANTIVERT) 25 MG tablet Take 25 mg by mouth 3 (three) times daily as needed.   metFORMIN (GLUCOPHAGE) 500 MG tablet Take 2 tablets (1,000 mg total) by mouth 2 (two) times daily with a meal.   pravastatin (PRAVACHOL) 40 MG tablet Take 1 tablet by mouth once daily   terbinafine (LAMISIL) 250 MG tablet Take 250 mg by mouth daily.   [DISCONTINUED] NOVOLOG MIX 70/30 FLEXPEN (70-30) 100 UNIT/ML FlexPen INJECT 22 UNITS SUBCUTANEOUSLY IN THE MORNING AND 18 UNITS  AT BEDTIME   NOVOLOG MIX 70/30 FLEXPEN (70-30) 100 UNIT/ML FlexPen INJECT 18 UNITS SUBCUTANEOUSLY IN THE MORNING AND 18 UNITS  AT BEDTIME   polyethylene glycol-electrolytes (NULYTELY) 420 g solution As directed (Patient not taking: Reported on 10/26/2022)   No facility-administered encounter medications on file as of 10/26/2022.   ALLERGIES: No Known Allergies VACCINATION STATUS: Immunization History  Administered Date(s) Administered   Moderna Sars-Covid-2 Vaccination 02/29/2020, 04/02/2020    Diabetes He presents for his follow-up diabetic visit. He has type 2 diabetes mellitus. Onset time: He was diagnosed at approximate age of 57 years. His disease course has been improving. Hypoglycemia symptoms include sweats and tremors. Pertinent negatives for hypoglycemia include no confusion, headaches, pallor or seizures. Pertinent negatives for diabetes include no chest pain, no fatigue, no polydipsia, no polyphagia, no polyuria and no weakness. There are no hypoglycemic complications. Symptoms are stable. Diabetic complications include nephropathy. Risk factors for coronary artery disease include dyslipidemia,  diabetes mellitus, hypertension, sedentary lifestyle, male sex, obesity, tobacco exposure and family history. Current diabetic treatment includes insulin injections and oral agent (monotherapy). He is compliant with treatment most of the time. His weight is fluctuating minimally. He is following a generally healthy diet. When asked about meal planning, he reported none. He has not had a previous visit with a dietitian. He rarely participates in exercise. His home blood glucose trend is decreasing steadily. His breakfast blood glucose range is generally 90-110 mg/dl. His lunch blood glucose range is generally 70-90 mg/dl. His dinner blood glucose range is generally 140-180 mg/dl. (He presents today, accompanied by his wife, with his logs showing at goal glycemic profile overall.  He does have some tightening of numbers at lunch, finds himself needing a snack to prevent hypoglycemia.  His POCT A1c today is 7.5%, improving from last visit of 7.9%.  He says he gets a  bit shaky between the time he takes his Metformin in the morning and breakfast but he also drinks his cup of coffee during that time too.  May look at reducing his Metformin in the future.) An ACE inhibitor/angiotensin II receptor blocker is being taken. He does not see a podiatrist.Eye exam is current.  Hyperlipidemia This is a chronic problem. The current episode started more than 1 year ago. The problem is uncontrolled. Recent lipid tests were reviewed and are high. Exacerbating diseases include chronic renal disease, diabetes and obesity. There are no known factors aggravating his hyperlipidemia. Pertinent negatives include no chest pain, myalgias or shortness of breath. Current antihyperlipidemic treatment includes statins. The current treatment provides moderate improvement of lipids. Compliance problems include adherence to diet and adherence to exercise.  Risk factors for coronary artery disease include diabetes mellitus, male sex, a sedentary  lifestyle, obesity, hypertension and dyslipidemia.  Hypertension This is a chronic problem. The current episode started more than 1 year ago. The problem has been gradually improving since onset. The problem is uncontrolled. Associated symptoms include sweats. Pertinent negatives include no chest pain, headaches, neck pain, palpitations or shortness of breath. There are no associated agents to hypertension. Risk factors for coronary artery disease include dyslipidemia, diabetes mellitus, obesity, male gender, sedentary lifestyle and smoking/tobacco exposure. Past treatments include ACE inhibitors and diuretics. The current treatment provides mild improvement. There are no compliance problems.  Identifiable causes of hypertension include chronic renal disease.    Review of systems  Constitutional: + Minimally fluctuating body weight,  current Body mass index is 39.1 kg/m. , no fatigue, no subjective hyperthermia, no subjective hypothermia Eyes: no blurry vision, no xerophthalmia ENT: no sore throat, no nodules palpated in throat, no dysphagia/odynophagia, no hoarseness Cardiovascular: no chest pain, no shortness of breath, no palpitations, no leg swelling Respiratory: no cough, no shortness of breath Gastrointestinal: no nausea/vomiting/diarrhea Musculoskeletal: no muscle/joint aches Skin: no rashes, no hyperemia Neurological: no tremors, no numbness, no tingling, no dizziness Psychiatric: no depression, no anxiety   Objective:    BP 132/78 (BP Location: Left Arm, Patient Position: Sitting, Cuff Size: Normal)   Pulse 73   Ht _0  (1.575 m)   Wt 213 lb 12.8 oz (97 kg)   BMI 39.10 kg/m   Wt Readings from Last 3 Encounters:  10/26/22 213 lb 12.8 oz (97 kg)  06/22/22 213 lb (96.6 kg)  02/17/22 215 lb 12.8 oz (97.9 kg)    BP Readings from Last 3 Encounters:  10/26/22 132/78  06/22/22 (!) 141/81  02/17/22 (!) 164/72     Physical Exam- Limited  Constitutional:  Body mass index is  39.1 kg/m. , not in acute distress, normal state of mind Eyes:  EOMI, no exophthalmos Neck: Supple Cardiovascular: RRR, no murmurs, rubs, or gallops, no edema Respiratory: Adequate breathing efforts, no crackles, rales, rhonchi, or wheezing Musculoskeletal: no gross deformities, strength intact in all four extremities, no gross restriction of joint movements Skin:  no rashes, no hyperemia Neurological: no tremor with outstretched hands   Diabetic Foot Exam - Simple   No data filed     Results for orders placed or performed in visit on 10/26/22  HgB A1c  Result Value Ref Range   Hemoglobin A1C 7.5 (A) 4.0 - 5.6 %   HbA1c POC (<> result, manual entry)     HbA1c, POC (prediabetic range)     HbA1c, POC (controlled diabetic range)    Diabetic Labs (most recent): Lab Results  Component Value  Date   HGBA1C 7.5 (A) 10/26/2022   HGBA1C 7.9 06/22/2022   HGBA1C 8.0 (A) 02/17/2022   MICROALBUR 10 06/17/2021   MICROALBUR <3.0 06/05/2020    Lipid Panel     Component Value Date/Time   CHOL 174 06/10/2021 0824   TRIG 182 (H) 06/10/2021 0824   HDL 41 06/10/2021 0824   CHOLHDL 4.2 06/10/2021 0824   LDLCALC 101 (H) 06/10/2021 0824      Latest Ref Rng & Units 10/18/2022    7:48 AM 02/15/2022    8:15 AM 09/08/2021   10:15 AM  CMP  Glucose 70 - 99 mg/dL 64  66  147   BUN 8 - 27 mg/dL _0 Creatinine 0.76 - 1.27 mg/dL 1.03  0.97  0.97   Sodium 134 - 144 mmol/L 145  143  138   Potassium 3.5 - 5.2 mmol/L 4.5  4.2  4.2   Chloride 96 - 106 mmol/L 104  103  102   CO2 20 - 29 mmol/L _1 Calcium 8.6 - 10.2 mg/dL 10.1  10.0  9.7   Total Protein 6.0 - 8.5 g/dL 7.1  6.9    Total Bilirubin 0.0 - 1.2 mg/dL 0.3  0.3    Alkaline Phos 44 - 121 IU/L 64  76    AST 0 - 40 IU/L 23  23    ALT 0 - 44 IU/L 7  12       Assessment & Plan:   1) Uncontrolled type 2 diabetes mellitus without complication, with long-term current use of insulin ([place])  He presents today, accompanied by  his wife, with his logs showing at goal glycemic profile overall.  He does have some tightening of numbers at lunch, finds himself needing a snack to prevent hypoglycemia.  His POCT A1c today is 7.5%, improving from last visit of 7.9%.  He says he gets a bit shaky between the time he takes his Metformin in the morning and breakfast but he also drinks his cup of coffee during that time too.  May look at reducing his Metformin in the future.   Glucose logs and insulin administration records pertaining to this visit,  to be scanned into patient's records.  Recent labs reviewed.  - Patient remains at a high risk for more acute and chronic complications of diabetes which include CAD, CVA, CKD, retinopathy, and neuropathy. These are all discussed in detail with the patient.  - Nutritional counseling repeated at each appointment due to patients tendency to fall back in to old habits.  - The patient admits there is a room for improvement in their diet and drink choices. -  Suggestion is made for the patient to avoid simple carbohydrates from their diet including Cakes, Sweet Desserts / Pastries, Ice Cream, Soda (diet and regular), Sweet Tea, Candies, Chips, Cookies, Sweet Pastries, Store Bought Juices, Alcohol in Excess of 1-2 drinks a day, Artificial Sweeteners, Coffee Creamer, and "Sugar-free" Products. This will help patient to have stable blood glucose profile and potentially avoid unintended weight gain.   - I encouraged the patient to switch to unprocessed or minimally processed complex starch and increased protein intake (animal or plant source), fruits, and vegetables.   - Patient is advised to stick to a routine mealtimes to eat 3 meals a day and avoid unnecessary snacks (to snack only to correct hypoglycemia).  - I have approached patient with the following individualized plan to manage diabetes and  patient agrees.  -He has responded very well to premixed insulin using Novolin 70/30.   -Given  his tightening glycemic profile, he is advised to lower his 70/30 to 18 units with breakfast and 18 units with supper (if glucose is above 90 and he is eating).   He is advised to continue Metformin 1000 mg po twice daily with meals (may reduce on subsequent visits).  -He is encouraged to monitor glucose at least 3 times daily, before injecting insulin (at breakfast and supper) and before bed, and call the clinic if he has readings less than 70 or greater than 200 for 3 days out of the week.  - Patient specific target  for A1c; LDL, HDL, Triglycerides, and  Waist Circumference were discussed in detail.  2) BP/HTN:  His blood pressure is controlled to target.  He is advised to continue Enalapril 20 mg po daily and Lasix 20 mg po daily.      3) Lipids/HPL:  His most recent lipid panel from 02/23/22 shows controlled LDL at 97 and elevated triglycerides of 175.  He is advised to continue his Pravastatin 40 mg po daily at bedtime (he may take 2 of his 20 mg tabs until he depletes his supply).  Side effects and precautions discussed with patient.    4)  Weight/Diet:  His Body mass index is 39.1 kg/m.--clearly complicating his diabetes care.  He is a candidate for modest weight loss.  CDE consult in progress, exercise, and carbohydrates information provided.  5) Chronic Care/Health Maintenance: -Patient is on ACEI and Statin medications and encouraged to continue to follow up with Ophthalmology, Podiatrist at least yearly or according to recommendations, and advised to stay away from smoking. I have recommended yearly flu vaccine and pneumonia vaccination at least every 5 years; moderate intensity exercise for up to 150 minutes weekly; and  sleep for at least 7 hours a day.  6) Vitamin D deficiency -His recent vitamin D level was 66.2 on 10/18/22.  He has been taking OTC Vitamin D3 5000 units daily as maintenance dose.  He can stop this for now.    I advised patient to maintain close follow up with his  PCP for primary care needs.       I spent 40 minutes in the care of the patient today including review of labs from [place], Lipids, Thyroid Function, Hematology (current and previous including abstractions from other facilities); face-to-face time discussing  his blood glucose readings/logs, discussing hypoglycemia and hyperglycemia episodes and symptoms, medications doses, his options of short and long term treatment based on the latest standards of care / guidelines;  discussion about incorporating lifestyle medicine;  and documenting the encounter. Risk reduction counseling performed per USPSTF guidelines to reduce obesity and cardiovascular risk factors.     Please refer to Patient Instructions for Blood Glucose Monitoring and Insulin/Medications Dosing Guide"  in media tab for additional information. Please  also refer to " Patient Self Inventory" in the Media  tab for reviewed elements of pertinent patient history.  Patrick Taylor participated in the discussions, expressed understanding, and voiced agreement with the above plans.  All questions were answered to his satisfaction. he is encouraged to contact clinic should he have any questions or concerns prior to his return visit.   Follow up plan: Return in about 4 months (around 02/24/2023) for Diabetes F/U with A1c in office, No previsit labs, Bring meter and logs.  Rayetta Pigg, FNP-BC [facility] [address] [place],  [place] [address] Phone: [phone] Fax: [phone]  10/26/2022, 9:14 AM

## 2022-11-10 ENCOUNTER — Other Ambulatory Visit: Payer: Self-pay | Admitting: Nurse Practitioner

## 2022-11-30 ENCOUNTER — Other Ambulatory Visit: Payer: Self-pay

## 2022-11-30 MED ORDER — ONETOUCH ULTRA 2 W/DEVICE KIT: PACK | 1 refills | Status: DC

## 2022-11-30 MED ORDER — ONETOUCH ULTRA VI STRP: ORAL_STRIP | 0 refills | Status: DC

## 2023-01-03 DIAGNOSIS — E1140 Type 2 diabetes mellitus with diabetic neuropathy, unspecified: Secondary | ICD-10-CM | POA: Diagnosis not present

## 2023-01-03 DIAGNOSIS — E1129 Type 2 diabetes mellitus with other diabetic kidney complication: Secondary | ICD-10-CM | POA: Diagnosis not present

## 2023-01-03 DIAGNOSIS — Z794 Long term (current) use of insulin: Secondary | ICD-10-CM | POA: Diagnosis not present

## 2023-01-03 DIAGNOSIS — H81312 Aural vertigo, left ear: Secondary | ICD-10-CM | POA: Diagnosis not present

## 2023-01-03 DIAGNOSIS — J441 Chronic obstructive pulmonary disease with (acute) exacerbation: Secondary | ICD-10-CM | POA: Diagnosis not present

## 2023-01-03 DIAGNOSIS — E782 Mixed hyperlipidemia: Secondary | ICD-10-CM | POA: Diagnosis not present

## 2023-01-03 DIAGNOSIS — J449 Chronic obstructive pulmonary disease, unspecified: Secondary | ICD-10-CM | POA: Diagnosis not present

## 2023-01-03 DIAGNOSIS — Z6838 Body mass index (BMI) 38.0-38.9, adult: Secondary | ICD-10-CM | POA: Diagnosis not present

## 2023-01-03 DIAGNOSIS — G894 Chronic pain syndrome: Secondary | ICD-10-CM | POA: Diagnosis not present

## 2023-01-03 DIAGNOSIS — I10 Essential (primary) hypertension: Secondary | ICD-10-CM | POA: Diagnosis not present

## 2023-01-03 DIAGNOSIS — E6609 Other obesity due to excess calories: Secondary | ICD-10-CM | POA: Diagnosis not present

## 2023-01-31 DIAGNOSIS — Z6839 Body mass index (BMI) 39.0-39.9, adult: Secondary | ICD-10-CM | POA: Diagnosis not present

## 2023-01-31 DIAGNOSIS — E1129 Type 2 diabetes mellitus with other diabetic kidney complication: Secondary | ICD-10-CM | POA: Diagnosis not present

## 2023-01-31 DIAGNOSIS — J449 Chronic obstructive pulmonary disease, unspecified: Secondary | ICD-10-CM | POA: Diagnosis not present

## 2023-01-31 DIAGNOSIS — E1140 Type 2 diabetes mellitus with diabetic neuropathy, unspecified: Secondary | ICD-10-CM | POA: Diagnosis not present

## 2023-01-31 DIAGNOSIS — G894 Chronic pain syndrome: Secondary | ICD-10-CM | POA: Diagnosis not present

## 2023-01-31 DIAGNOSIS — I10 Essential (primary) hypertension: Secondary | ICD-10-CM | POA: Diagnosis not present

## 2023-01-31 DIAGNOSIS — E1165 Type 2 diabetes mellitus with hyperglycemia: Secondary | ICD-10-CM | POA: Diagnosis not present

## 2023-01-31 DIAGNOSIS — Z794 Long term (current) use of insulin: Secondary | ICD-10-CM | POA: Diagnosis not present

## 2023-02-03 DIAGNOSIS — Z125 Encounter for screening for malignant neoplasm of prostate: Secondary | ICD-10-CM | POA: Diagnosis not present

## 2023-02-03 DIAGNOSIS — E039 Hypothyroidism, unspecified: Secondary | ICD-10-CM | POA: Diagnosis not present

## 2023-02-03 DIAGNOSIS — E1129 Type 2 diabetes mellitus with other diabetic kidney complication: Secondary | ICD-10-CM | POA: Diagnosis not present

## 2023-02-03 DIAGNOSIS — Z0001 Encounter for general adult medical examination with abnormal findings: Secondary | ICD-10-CM | POA: Diagnosis not present

## 2023-02-03 DIAGNOSIS — D518 Other vitamin B12 deficiency anemias: Secondary | ICD-10-CM | POA: Diagnosis not present

## 2023-02-03 DIAGNOSIS — E559 Vitamin D deficiency, unspecified: Secondary | ICD-10-CM | POA: Diagnosis not present

## 2023-02-25 ENCOUNTER — Ambulatory Visit: Payer: Medicare HMO | Admitting: Nurse Practitioner

## 2023-02-25 DIAGNOSIS — E782 Mixed hyperlipidemia: Secondary | ICD-10-CM

## 2023-02-25 DIAGNOSIS — I10 Essential (primary) hypertension: Secondary | ICD-10-CM

## 2023-02-25 DIAGNOSIS — E559 Vitamin D deficiency, unspecified: Secondary | ICD-10-CM

## 2023-02-25 DIAGNOSIS — E1165 Type 2 diabetes mellitus with hyperglycemia: Secondary | ICD-10-CM

## 2023-02-28 DIAGNOSIS — D649 Anemia, unspecified: Secondary | ICD-10-CM | POA: Diagnosis not present

## 2023-03-08 ENCOUNTER — Other Ambulatory Visit: Payer: Self-pay

## 2023-03-08 DIAGNOSIS — E1165 Type 2 diabetes mellitus with hyperglycemia: Secondary | ICD-10-CM

## 2023-03-08 MED ORDER — GLUCOSE BLOOD VI STRP: 1.0000 | ORAL_STRIP | Freq: Three times a day (TID) | 2 refills | Status: DC

## 2023-03-08 MED ORDER — NOVOLOG MIX 70/30 FLEXPEN (70-30) 100 UNIT/ML ~~LOC~~ SUPN: PEN_INJECTOR | SUBCUTANEOUS | 0 refills | Status: DC

## 2023-03-08 MED ORDER — BD PEN NEEDLE MINI U/F 31G X 5 MM MISC: 0 refills | Status: DC

## 2023-03-10 DIAGNOSIS — Z1211 Encounter for screening for malignant neoplasm of colon: Secondary | ICD-10-CM | POA: Diagnosis not present

## 2023-03-29 NOTE — Progress Notes (Unsigned)
GI Office Note    Referring Provider: Elfredia Nevins, MD Primary Care Physician:  Elfredia Nevins, MD  Primary Gastroenterologist:  Chief Complaint   No chief complaint on file.    History of Present Illness   Patrick Taylor is a 72 y.o. male presenting today at the request of Dr. Sherwood Gambler for positive Colocare X 3.   [facility] labs***  Colonoscopy 08/2021: -two 4-49mm polyps removed descending colon and hepatic flexure -tubular adenomas -5 year surveillance colonoscopy   Medications   Current Outpatient Medications  Medication Sig Dispense Refill   albuterol (VENTOLIN HFA) 108 (90 Base) MCG/ACT inhaler Inhale 1-2 puffs into the lungs every 6 (six) hours as needed.     aspirin 81 MG tablet Take 81 mg by mouth once a week.     blood glucose meter kit and supplies KIT Dispense based on patient and insurance preference. Use 3 times daily as directed (before breakfast, before supper, before bed). Dx e11.9 1 each 0   Blood Glucose Monitoring Suppl (ONE TOUCH ULTRA 2) w/Device KIT Use to test blood glucose 3 times daily. E11.65 1 kit 1   Budeson-Glycopyrrol-Formoterol (BREZTRI AEROSPHERE) 160-9-4.8 MCG/ACT AERO Inhale into the lungs.     calcium-vitamin D (OSCAL WITH D) 500-200 MG-UNIT tablet Take 1 tablet by mouth daily.     celecoxib (CELEBREX) 200 MG capsule Take 200 mg by mouth 2 (two) times daily as needed.     Cholecalciferol (VITAMIN D3) 125 MCG (5000 UT) CAPS Take 5,000 Units by mouth daily.     cyclobenzaprine (FLEXERIL) 10 MG tablet Take 10 mg by mouth every 8 (eight) hours as needed for muscle spasms.     enalapril (VASOTEC) 20 MG tablet Take 1 tablet (20 mg total) by mouth daily. 90 tablet 1   furosemide (LASIX) 20 MG tablet Take 20 mg by mouth daily.      gabapentin (NEURONTIN) 300 MG capsule Take 300 mg by mouth 3 (three) times daily. 5 CAPS daily     glucose blood (ONETOUCH ULTRA) test strip USE AS INSTRUCTED TO TEST BLOOD GLUCOSE Three TIMES DAILY E11.65 300  each 0   glucose blood test strip 1 each by Other route 3 (three) times daily. Use as instructed tid E11.65 Accu-chek guide 300 each 2   HYDROcodone-acetaminophen (NORCO) 10-325 MG tablet Take 1 tablet by mouth 4 (four) times daily as needed.     Insulin Pen Needle (B-D UF III MINI PEN NEEDLES) 31G X 5 MM MISC USE FOR INSULIN INJECTIONS 2 TIMES A DAY 200 each 0   Lancets (ONETOUCH ULTRASOFT) lancets Use as instructed to test blood glucose 4 times daily. 400 each 1   meclizine (ANTIVERT) 25 MG tablet Take 25 mg by mouth 3 (three) times daily as needed.     metFORMIN (GLUCOPHAGE) 500 MG tablet Take 2 tablets (1,000 mg total) by mouth 2 (two) times daily with a meal. 360 tablet 3   NOVOLOG MIX 70/30 FLEXPEN (70-30) 100 UNIT/ML FlexPen INJECT 22 UNITS ONCE DAILY IN THE MORNING AND 18 UNITS SUBCUTANEOUSLY  AT BEDTIME 45 mL 0   polyethylene glycol-electrolytes (NULYTELY) 420 g solution As directed (Patient not taking: Reported on 10/26/2022) 4000 mL 0   pravastatin (PRAVACHOL) 40 MG tablet Take 1 tablet by mouth once daily 90 tablet 0   terbinafine (LAMISIL) 250 MG tablet Take 250 mg by mouth daily.     No current facility-administered medications for this visit.    Allergies   Allergies as  of 03/30/2023   (No Known Allergies)    Past Medical History   Past Medical History:  Diagnosis Date   Anemia    Asthma    Carpal tunnel syndrome    Chronic back pain    Colon polyps    COPD (chronic obstructive pulmonary disease) (HCC)    DM (diabetes mellitus) (HCC)    HTN (hypertension)    Hyperlipidemia     Past Surgical History   Past Surgical History:  Procedure Laterality Date   ABDOMINAL HERNIA REPAIR     CATARACT EXTRACTION W/PHACO Right 10/02/2021   Procedure: CATARACT EXTRACTION PHACO AND INTRAOCULAR LENS PLACEMENT (IOC);  Surgeon: Fabio Pierce, MD;  Location: [facility];  Service: Ophthalmology;  Laterality: Right;  CDE: 4.57    CATARACT EXTRACTION W/PHACO Left 10/29/2021    Procedure: CATARACT EXTRACTION PHACO AND INTRAOCULAR LENS PLACEMENT LEFT EYE;  Surgeon: Fabio Pierce, MD;  Location: [facility];  Service: Ophthalmology;  Laterality: Left;  CDE 8.51   COLONOSCOPY  01/24/09   rectal polyp/adenomatous poylps/tubulovillous   COLONOSCOPY  03/02/2012   Procedure: COLONOSCOPY;  Surgeon: Corbin Ade, MD;  Location: [facility];  Service: Endoscopy;  Laterality: N/A;  11:00   COLONOSCOPY WITH PROPOFOL N/A 09/10/2021   Procedure: COLONOSCOPY WITH PROPOFOL;  Surgeon: Corbin Ade, MD;  Location: [facility];  Service: Endoscopy;  Laterality: N/A;  8:45am   POLYPECTOMY  09/10/2021   Procedure: POLYPECTOMY;  Surgeon: Corbin Ade, MD;  Location: [facility];  Service: Endoscopy;;    Past Family History   Family History  Problem Relation Age of Onset   Colon cancer Other     Past Social History   Social History   Socioeconomic History   Marital status: Married    Spouse name: Not on file   Number of children: Not on file   Years of education: Not on file   Highest education level: Not on file  Occupational History   Occupation: works at [employer]  Tobacco Use   Smoking status: Former    Packs/day: 1.00    Years: 20.00    Additional pack years: 0.00    Total pack years: 20.00    Types: Cigarettes    Quit date: 02/18/1999    Years since quitting: 24.1   Smokeless tobacco: Never  Vaping Use   Vaping Use: Never used  Substance and Sexual Activity   Alcohol use: No    Comment: no heavy use in past   Drug use: No   Sexual activity: Not on file  Other Topics Concern   Not on file  Social History Narrative   Not on file   Social Determinants of Health   Financial Resource Strain: Not on file  Food Insecurity: Not on file  Transportation Needs: Not on file  Physical Activity: Not on file  Stress: Not on file  Social Connections: Not on file  Intimate Partner Violence: Not on file    Review of Systems   General: Negative for  anorexia, weight loss, fever, chills, fatigue, weakness. Eyes: Negative for vision changes.  ENT: Negative for hoarseness, difficulty swallowing , nasal congestion. CV: Negative for chest pain, angina, palpitations, dyspnea on exertion, peripheral edema.  Respiratory: Negative for dyspnea at rest, dyspnea on exertion, cough, sputum, wheezing.  GI: See history of present illness. GU:  Negative for dysuria, hematuria, urinary incontinence, urinary frequency, nocturnal urination.  MS: Negative for joint pain, low back pain.  Derm: Negative for rash or itching.  Neuro: Negative for weakness, abnormal sensation, seizure, frequent headaches, memory loss,  confusion.  Psych: Negative for anxiety, depression, suicidal ideation, hallucinations.  Endo: Negative for unusual weight change.  Heme: Negative for bruising or bleeding. Allergy: Negative for rash or hives.  Physical Exam   There were no vitals taken for this visit.   General: Well-nourished, well-developed in no acute distress.  Head: Normocephalic, atraumatic.   Eyes: Conjunctiva pink, no icterus. Mouth: Oropharyngeal mucosa moist and pink , no lesions erythema or exudate. Neck: Supple without thyromegaly, masses, or lymphadenopathy.  Lungs: Clear to auscultation bilaterally.  Heart: Regular rate and rhythm, no murmurs rubs or gallops.  Abdomen: Bowel sounds are normal, nontender, nondistended, no hepatosplenomegaly or masses,  no abdominal bruits or hernia, no rebound or guarding.   Rectal: *** Extremities: No lower extremity edema. No clubbing or deformities.  Neuro: Alert and oriented x 4 , grossly normal neurologically.  Skin: Warm and dry, no rash or jaundice.   Psych: Alert and cooperative, normal mood and affect.  Labs   *** Imaging Studies   No results found.  Assessment       PLAN   ***   Leanna Battles. Melvyn Neth, MHS, PA-C [facility]

## 2023-03-30 ENCOUNTER — Encounter: Payer: Self-pay | Admitting: Gastroenterology

## 2023-03-30 ENCOUNTER — Ambulatory Visit: Payer: Medicare HMO | Admitting: Gastroenterology

## 2023-03-30 ENCOUNTER — Ambulatory Visit (INDEPENDENT_AMBULATORY_CARE_PROVIDER_SITE_OTHER): Payer: Medicare HMO | Admitting: Gastroenterology

## 2023-03-30 VITALS — BP 116/74 | HR 77 | Temp 98.1°F | Ht 62.0 in | Wt 215.8 lb

## 2023-03-30 DIAGNOSIS — R195 Other fecal abnormalities: Secondary | ICD-10-CM | POA: Diagnosis not present

## 2023-03-30 DIAGNOSIS — D649 Anemia, unspecified: Secondary | ICD-10-CM

## 2023-03-30 NOTE — Patient Instructions (Signed)
We will be in touch next week after I discuss your case with Dr. Jena Gauss. You have mild anemia and blood detected in your stools. Given recent colonoscopy in 08/2022, Dr. Jena Gauss may advise and upper endoscopy to look at your stomach for source of bleeding. He may advise repeating your colonoscopy. We will be in touch next week with further recommendations.  Please watch for any black or bloody stools. If you see any, call us at [phone], and let my CMA, Tammy know.

## 2023-04-08 ENCOUNTER — Telehealth: Payer: Self-pay | Admitting: Gastroenterology

## 2023-04-08 NOTE — Telephone Encounter (Signed)
Please let pt know that Dr. Jena Gauss advised EGD for anemia, heme + stool. No need for colonoscopy at this time.  Please schedule EGD ASA 3.  Day before EGD, take only AM metformin. Morning of EGD, no metformin

## 2023-04-11 ENCOUNTER — Ambulatory Visit (INDEPENDENT_AMBULATORY_CARE_PROVIDER_SITE_OTHER): Payer: Medicare HMO | Admitting: Nurse Practitioner

## 2023-04-11 ENCOUNTER — Encounter: Payer: Self-pay | Admitting: Nurse Practitioner

## 2023-04-11 VITALS — BP 136/84 | HR 79 | Ht 62.0 in | Wt 211.0 lb

## 2023-04-11 DIAGNOSIS — E1165 Type 2 diabetes mellitus with hyperglycemia: Secondary | ICD-10-CM

## 2023-04-11 DIAGNOSIS — E559 Vitamin D deficiency, unspecified: Secondary | ICD-10-CM | POA: Diagnosis not present

## 2023-04-11 DIAGNOSIS — Z794 Long term (current) use of insulin: Secondary | ICD-10-CM | POA: Diagnosis not present

## 2023-04-11 DIAGNOSIS — E782 Mixed hyperlipidemia: Secondary | ICD-10-CM | POA: Diagnosis not present

## 2023-04-11 DIAGNOSIS — D649 Anemia, unspecified: Secondary | ICD-10-CM | POA: Diagnosis not present

## 2023-04-11 DIAGNOSIS — J449 Chronic obstructive pulmonary disease, unspecified: Secondary | ICD-10-CM | POA: Diagnosis not present

## 2023-04-11 DIAGNOSIS — I10 Essential (primary) hypertension: Secondary | ICD-10-CM

## 2023-04-11 DIAGNOSIS — E1129 Type 2 diabetes mellitus with other diabetic kidney complication: Secondary | ICD-10-CM | POA: Diagnosis not present

## 2023-04-11 DIAGNOSIS — G894 Chronic pain syndrome: Secondary | ICD-10-CM | POA: Diagnosis not present

## 2023-04-11 DIAGNOSIS — E1140 Type 2 diabetes mellitus with diabetic neuropathy, unspecified: Secondary | ICD-10-CM | POA: Diagnosis not present

## 2023-04-11 DIAGNOSIS — E6609 Other obesity due to excess calories: Secondary | ICD-10-CM | POA: Diagnosis not present

## 2023-04-11 LAB — POCT GLYCOSYLATED HEMOGLOBIN (HGB A1C): Hemoglobin A1C: 7.7 % — AB (ref 4.0–5.6)

## 2023-04-11 MED ORDER — METFORMIN HCL 500 MG PO TABS: 500.0000 mg | ORAL_TABLET | Freq: Two times a day (BID) | ORAL | 3 refills | Status: DC

## 2023-04-11 MED ORDER — NOVOLOG MIX 70/30 FLEXPEN (70-30) 100 UNIT/ML ~~LOC~~ SUPN: PEN_INJECTOR | SUBCUTANEOUS | 0 refills | Status: DC

## 2023-04-11 NOTE — Telephone Encounter (Signed)
PA approved via [employer]here. Auth# [id number], DOS: 05/19/2023 - 07/19/2023

## 2023-04-11 NOTE — Telephone Encounter (Signed)
SPOKE WITH PT. Scheduled for 6/6 AM appt. He is aware will mail instructions/pre-op appt to him.

## 2023-04-11 NOTE — Progress Notes (Signed)
04/11/2023   Endocrinology follow-up note   Subjective:    Patient ID: Patrick Taylor, male    DOB: 1951-07-08,    Past Medical History:  Diagnosis Date   Anemia    Asthma    Carpal tunnel syndrome    Chronic back pain    Colon polyps    COPD (chronic obstructive pulmonary disease) (HCC)    DM (diabetes mellitus) (HCC)    HTN (hypertension)    Hyperlipidemia    Past Surgical History:  Procedure Laterality Date   ABDOMINAL HERNIA REPAIR     CATARACT EXTRACTION W/PHACO Right 10/02/2021   Procedure: CATARACT EXTRACTION PHACO AND INTRAOCULAR LENS PLACEMENT (IOC);  Surgeon: Fabio Pierce, MD;  Location: [facility];  Service: Ophthalmology;  Laterality: Right;  CDE: 4.57    CATARACT EXTRACTION W/PHACO Left 10/29/2021   Procedure: CATARACT EXTRACTION PHACO AND INTRAOCULAR LENS PLACEMENT LEFT EYE;  Surgeon: Fabio Pierce, MD;  Location: [facility];  Service: Ophthalmology;  Laterality: Left;  CDE 8.51   COLONOSCOPY  01/24/09   rectal polyp/adenomatous poylps/tubulovillous   COLONOSCOPY  03/02/2012   Procedure: COLONOSCOPY;  Surgeon: Corbin Ade, MD;  Location: [facility];  Service: Endoscopy;  Laterality: N/A;  11:00   COLONOSCOPY WITH PROPOFOL N/A 09/10/2021   Procedure: COLONOSCOPY WITH PROPOFOL;  Surgeon: Corbin Ade, MD;  Location: [facility];  Service: Endoscopy;  Laterality: N/A;  8:45am   POLYPECTOMY  09/10/2021   Procedure: POLYPECTOMY;  Surgeon: Corbin Ade, MD;  Location: [facility];  Service: Endoscopy;;   Social History   Socioeconomic History   Marital status: Married    Spouse name: Not on file   Number of children: Not on file   Years of education: Not on file   Highest education level: Not on file  Occupational History   Occupation: works at [employer]  Tobacco Use   Smoking status: Former    Packs/day: 1.00    Years: 20.00    Additional pack years: 0.00    Total pack years: 20.00    Types: Cigarettes    Quit date: 02/18/1999    Years since  quitting: 24.1   Smokeless tobacco: Never  Vaping Use   Vaping Use: Never used  Substance and Sexual Activity   Alcohol use: No    Comment: no heavy use in past   Drug use: No   Sexual activity: Not Currently  Other Topics Concern   Not on file  Social History Narrative   Not on file   Social Determinants of Health   Financial Resource Strain: Not on file  Food Insecurity: Not on file  Transportation Needs: Not on file  Physical Activity: Not on file  Stress: Not on file  Social Connections: Not on file   Outpatient Encounter Medications as of 04/11/2023  Medication Sig   albuterol (PROVENTIL) 2 MG tablet Take 2 mg by mouth 3 (three) times daily.   albuterol (VENTOLIN HFA) 108 (90 Base) MCG/ACT inhaler Inhale 1-2 puffs into the lungs every 6 (six) hours as needed.   aspirin 81 MG tablet Take 81 mg by mouth once a week.   blood glucose meter kit and supplies KIT Dispense based on patient and insurance preference. Use 3 times daily as directed (before breakfast, before supper, before bed). Dx e11.9   Blood Glucose Monitoring Suppl (ONE TOUCH ULTRA 2) w/Device KIT Use to test blood glucose 3 times daily. E11.65   Budeson-Glycopyrrol-Formoterol (BREZTRI AEROSPHERE) 160-9-4.8 MCG/ACT AERO Inhale into the  lungs.   celecoxib (CELEBREX) 200 MG capsule Take 200 mg by mouth 2 (two) times daily as needed.   enalapril (VASOTEC) 20 MG tablet Take 1 tablet (20 mg total) by mouth daily.   furosemide (LASIX) 20 MG tablet Take 20 mg by mouth daily.    gabapentin (NEURONTIN) 300 MG capsule Take 300 mg by mouth 3 (three) times daily. 5 CAPS daily   glucose blood (ONETOUCH ULTRA) test strip USE AS INSTRUCTED TO TEST BLOOD GLUCOSE Three TIMES DAILY E11.65   glucose blood test strip 1 each by Other route 3 (three) times daily. Use as instructed tid E11.65 Accu-chek guide   HYDROcodone-acetaminophen (NORCO) 10-325 MG tablet Take 1 tablet by mouth 4 (four) times daily as needed.   Insulin Pen Needle  (B-D UF III MINI PEN NEEDLES) 31G X 5 MM MISC USE FOR INSULIN INJECTIONS 2 TIMES A DAY   Lancets (ONETOUCH ULTRASOFT) lancets Use as instructed to test blood glucose 4 times daily.   meclizine (ANTIVERT) 25 MG tablet Take 25 mg by mouth 3 (three) times daily as needed.   pravastatin (PRAVACHOL) 40 MG tablet Take 1 tablet by mouth once daily   [DISCONTINUED] metFORMIN (GLUCOPHAGE) 500 MG tablet Take 2 tablets (1,000 mg total) by mouth 2 (two) times daily with a meal.   [DISCONTINUED] NOVOLOG MIX 70/30 FLEXPEN (70-30) 100 UNIT/ML FlexPen INJECT 22 UNITS ONCE DAILY IN THE MORNING AND 18 UNITS SUBCUTANEOUSLY  AT BEDTIME   metFORMIN (GLUCOPHAGE) 500 MG tablet Take 1 tablet (500 mg total) by mouth 2 (two) times daily with a meal.   NOVOLOG MIX 70/30 FLEXPEN (70-30) 100 UNIT/ML FlexPen INJECT 18 UNITS ONCE DAILY IN THE MORNING AND 18 UNITS SUBCUTANEOUSLY  AT BEDTIME   No facility-administered encounter medications on file as of 04/11/2023.   ALLERGIES: No Known Allergies VACCINATION STATUS: Immunization History  Administered Date(s) Administered   Moderna Sars-Covid-2 Vaccination 02/29/2020, 04/02/2020    Diabetes He presents for his follow-up diabetic visit. He has type 2 diabetes mellitus. Onset time: He was diagnosed at approximate age of 41 years. His disease course has been stable. Hypoglycemia symptoms include sweats and tremors. Pertinent negatives for hypoglycemia include no confusion, headaches, pallor or seizures. Pertinent negatives for diabetes include no chest pain, no fatigue, no polydipsia, no polyphagia, no polyuria and no weakness. There are no hypoglycemic complications. Symptoms are stable. Diabetic complications include nephropathy. Risk factors for coronary artery disease include dyslipidemia, diabetes mellitus, hypertension, sedentary lifestyle, male sex, obesity, tobacco exposure and family history. Current diabetic treatment includes insulin injections and oral agent  (monotherapy). He is compliant with treatment most of the time. His weight is fluctuating minimally. He is following a generally healthy diet. When asked about meal planning, he reported none. He has not had a previous visit with a dietitian. He rarely participates in exercise. His home blood glucose trend is fluctuating minimally. His breakfast blood glucose range is generally 90-110 mg/dl. His lunch blood glucose range is generally 130-140 mg/dl. His dinner blood glucose range is generally 140-180 mg/dl. (He presents today, accompanied by his wife, with his logs showing at goal glycemic profile overall.  He does have some tightening of numbers at lunch, finds himself needing a snack to prevent hypoglycemia.  His POCT A1c today is 7.7%, increasing from last visit of 7.5%.  He is undergoing workup for anemia with EGD coming up.) An ACE inhibitor/angiotensin II receptor blocker is being taken. He does not see a podiatrist.Eye exam is current.  Hyperlipidemia This  is a chronic problem. The current episode started more than 1 year ago. The problem is uncontrolled. Recent lipid tests were reviewed and are high. Exacerbating diseases include chronic renal disease, diabetes and obesity. There are no known factors aggravating his hyperlipidemia. Pertinent negatives include no chest pain, myalgias or shortness of breath. Current antihyperlipidemic treatment includes statins. The current treatment provides moderate improvement of lipids. Compliance problems include adherence to diet and adherence to exercise.  Risk factors for coronary artery disease include diabetes mellitus, male sex, a sedentary lifestyle, obesity, hypertension and dyslipidemia.  Hypertension This is a chronic problem. The current episode started more than 1 year ago. The problem has been gradually improving since onset. The problem is uncontrolled. Associated symptoms include sweats. Pertinent negatives include no chest pain, headaches, neck pain,  palpitations or shortness of breath. There are no associated agents to hypertension. Risk factors for coronary artery disease include dyslipidemia, diabetes mellitus, obesity, male gender, sedentary lifestyle and smoking/tobacco exposure. Past treatments include ACE inhibitors and diuretics. The current treatment provides mild improvement. There are no compliance problems.  Identifiable causes of hypertension include chronic renal disease.    Review of systems  Constitutional: + Minimally fluctuating body weight,  current Body mass index is 38.59 kg/m. , no fatigue, no subjective hyperthermia, no subjective hypothermia Eyes: no blurry vision, no xerophthalmia ENT: no sore throat, no nodules palpated in throat, no dysphagia/odynophagia, no hoarseness Cardiovascular: no chest pain, no shortness of breath, no palpitations, no leg swelling Respiratory: no cough, no shortness of breath Gastrointestinal: no nausea/vomiting/diarrhea Musculoskeletal: no muscle/joint aches Skin: no rashes, no hyperemia Neurological: no tremors, no numbness, no tingling, no dizziness Psychiatric: no depression, no anxiety   Objective:    BP 136/84 (BP Location: Left Arm, Patient Position: Sitting, Cuff Size: Large)   Pulse 79   Ht 5\' 2"  (1.575 m)   Wt 211 lb (95.7 kg)   BMI 38.59 kg/m   Wt Readings from Last 3 Encounters:  04/11/23 211 lb (95.7 kg)  03/30/23 215 lb 12.8 oz (97.9 kg)  10/26/22 213 lb 12.8 oz (97 kg)    BP Readings from Last 3 Encounters:  04/11/23 136/84  03/30/23 116/74  10/26/22 132/78     Physical Exam- Limited  Constitutional:  Body mass index is 38.59 kg/m. , not in acute distress, normal state of mind Eyes:  EOMI, no exophthalmos Musculoskeletal: no gross deformities, strength intact in all four extremities, no gross restriction of joint movements Skin:  no rashes, no hyperemia Neurological: no tremor with outstretched hands   Diabetic Foot Exam - Simple   No data filed      Results for orders placed or performed in visit on 04/11/23  HgB A1c  Result Value Ref Range   Hemoglobin A1C 7.7 (A) 4.0 - 5.6 %   HbA1c POC (<> result, manual entry)     HbA1c, POC (prediabetic range)     HbA1c, POC (controlled diabetic range)    Diabetic Labs (most recent): Lab Results  Component Value Date   HGBA1C 7.7 (A) 04/11/2023   HGBA1C 7.5 (A) 10/26/2022   HGBA1C 7.9 06/22/2022   MICROALBUR 10 06/17/2021   MICROALBUR <3.0 06/05/2020    Lipid Panel     Component Value Date/Time   CHOL 174 06/10/2021 0824   TRIG 182 (H) 06/10/2021 0824   HDL 41 06/10/2021 0824   CHOLHDL 4.2 06/10/2021 0824   LDLCALC 101 (H) 06/10/2021 0824      Latest Ref Rng & Units  10/18/2022    7:48 AM 02/15/2022    8:15 AM 09/08/2021   10:15 AM  CMP  Glucose 70 - 99 mg/dL 64  66  161   BUN 8 - 27 mg/dL 15  14  18    Creatinine 0.76 - 1.27 mg/dL 0.96  0.45  4.09   Sodium 134 - 144 mmol/L 145  143  138   Potassium 3.5 - 5.2 mmol/L 4.5  4.2  4.2   Chloride 96 - 106 mmol/L 104  103  102   CO2 20 - 29 mmol/L 25  27  29    Calcium 8.6 - 10.2 mg/dL 81.1  91.4  9.7   Total Protein 6.0 - 8.5 g/dL 7.1  6.9    Total Bilirubin 0.0 - 1.2 mg/dL 0.3  0.3    Alkaline Phos 44 - 121 IU/L 64  76    AST 0 - 40 IU/L 23  23    ALT 0 - 44 IU/L 7  12       Assessment & Plan:   1) Uncontrolled type 2 diabetes mellitus without complication, with long-term current use of insulin (HCC)  He presents today, accompanied by his wife, with his logs showing at goal glycemic profile overall.  He does have some tightening of numbers at lunch, finds himself needing a snack to prevent hypoglycemia.  His POCT A1c today is 7.7%, increasing from last visit of 7.5%.  He is undergoing workup for anemia with EGD coming up.   Glucose logs and insulin administration records pertaining to this visit,  to be scanned into patient's records.  Recent labs reviewed.  - Patient remains at a high risk for more acute and chronic  complications of diabetes which include CAD, CVA, CKD, retinopathy, and neuropathy. These are all discussed in detail with the patient.  - Nutritional counseling repeated at each appointment due to patients tendency to fall back in to old habits.  - The patient admits there is a room for improvement in their diet and drink choices. -  Suggestion is made for the patient to avoid simple carbohydrates from their diet including Cakes, Sweet Desserts / Pastries, Ice Cream, Soda (diet and regular), Sweet Tea, Candies, Chips, Cookies, Sweet Pastries, Store Bought Juices, Alcohol in Excess of 1-2 drinks a day, Artificial Sweeteners, Coffee Creamer, and "Sugar-free" Products. This will help patient to have stable blood glucose profile and potentially avoid unintended weight gain.   - I encouraged the patient to switch to unprocessed or minimally processed complex starch and increased protein intake (animal or plant source), fruits, and vegetables.   - Patient is advised to stick to a routine mealtimes to eat 3 meals a day and avoid unnecessary snacks (to snack only to correct hypoglycemia).  - I have approached patient with the following individualized plan to manage diabetes and patient agrees.  -He has responded very well to premixed insulin using Novolin 70/30.   -He is advised to continue 70/30 18 units with breakfast and 18 units with supper (if glucose is above 90 and he is eating).   He is advised to lower his Metformin to 500 mg po twice daily after meals.    -He is encouraged to monitor glucose at least 3 times daily, before injecting insulin (at breakfast and supper) and before bed, and call the clinic if he has readings less than 70 or greater than 200 for 3 days out of the week.  - Patient specific target  for A1c; LDL, HDL,  Triglycerides, and  Waist Circumference were discussed in detail.  2) BP/HTN:  His blood pressure is controlled to target.  He is advised to continue Enalapril 20 mg po  daily and Lasix 20 mg po daily.      3) Lipids/HPL:  His most recent lipid panel from 02/03/23 shows controlled LDL at 84.  He is advised to continue his Pravastatin 40 mg po daily at bedtime.  Side effects and precautions discussed with patient.    4)  Weight/Diet:  His Body mass index is 38.59 kg/m.--clearly complicating his diabetes care.  He is a candidate for modest weight loss.  CDE consult in progress, exercise, and carbohydrates information provided.  5) Chronic Care/Health Maintenance: -Patient is on ACEI and Statin medications and encouraged to continue to follow up with Ophthalmology, Podiatrist at least yearly or according to recommendations, and advised to stay away from smoking. I have recommended yearly flu vaccine and pneumonia vaccination at least every 5 years; moderate intensity exercise for up to 150 minutes weekly; and  sleep for at least 7 hours a day.  6) Vitamin D deficiency -His recent vitamin D level was 66.2 on 10/18/22.  He has been taking OTC Vitamin D3 5000 units daily as maintenance dose.     I advised patient to maintain close follow up with his PCP for primary care needs.      I spent  41  minutes in the care of the patient today including review of labs from CMP, Lipids, Thyroid Function, Hematology (current and previous including abstractions from other facilities); face-to-face time discussing  his blood glucose readings/logs, discussing hypoglycemia and hyperglycemia episodes and symptoms, medications doses, his options of short and long term treatment based on the latest standards of care / guidelines;  discussion about incorporating lifestyle medicine;  and documenting the encounter. Risk reduction counseling performed per USPSTF guidelines to reduce obesity and cardiovascular risk factors.     Please refer to Patient Instructions for Blood Glucose Monitoring and Insulin/Medications Dosing Guide"  in media tab for additional information. Please  also refer  to " Patient Self Inventory" in the Media  tab for reviewed elements of pertinent patient history.  Patrick Taylor participated in the discussions, expressed understanding, and voiced agreement with the above plans.  All questions were answered to his satisfaction. he is encouraged to contact clinic should he have any questions or concerns prior to his return visit.   Follow up plan: Return in about 4 months (around 08/11/2023) for Diabetes F/U with A1c in office, No previsit labs, Bring meter and logs.  Ronny Bacon, [facility] [address] [place], [place] [address] Phone: [phone] Fax: [phone]  04/11/2023, 9:45 AM

## 2023-04-11 NOTE — Telephone Encounter (Signed)
Pt was made aware and verbalized understanding. Pt is ready to move forward with scheduling.  

## 2023-04-12 ENCOUNTER — Encounter: Payer: Self-pay | Admitting: *Deleted

## 2023-04-12 DIAGNOSIS — K922 Gastrointestinal hemorrhage, unspecified: Secondary | ICD-10-CM | POA: Diagnosis not present

## 2023-04-12 DIAGNOSIS — D649 Anemia, unspecified: Secondary | ICD-10-CM | POA: Diagnosis not present

## 2023-04-13 ENCOUNTER — Telehealth: Payer: Self-pay | Admitting: *Deleted

## 2023-04-13 MED ORDER — FREESTYLE LIBRE 3 SENSOR MISC: 3 refills | Status: DC

## 2023-04-13 MED ORDER — FREESTYLE LIBRE 3 READER DEVI: 1.0000 | Freq: Once | 0 refills | Status: AC

## 2023-04-13 NOTE — Telephone Encounter (Signed)
done

## 2023-04-13 NOTE — Telephone Encounter (Signed)
Talked with the patient and he would like for a prescription be sent to the [facility] in [place], for the [place]. He want have time to call his insurance until Saturday, food drive [place] [employer].

## 2023-04-13 NOTE — Telephone Encounter (Signed)
Noted  

## 2023-04-13 NOTE — Telephone Encounter (Signed)
We can try Libre 3, perhaps that will be cheaper alternative.  He can also call his insurance company to see if they recommend sending it to a certain place if they'd cover more of it (or if it is because he has not met deductible?).

## 2023-04-13 NOTE — Telephone Encounter (Signed)
Patrick Taylor left a message that he received a call from insurance about the monitor. They are telling the patient that he will need to may $205.42 for 90 days. Patient states that he does not have that kind of money, He would like to know if you have any suggestions for him?

## 2023-04-19 ENCOUNTER — Telehealth: Payer: Self-pay | Admitting: *Deleted

## 2023-04-19 NOTE — Telephone Encounter (Signed)
Should he start taking the Meformin 500 mg two tablets twice a day. He says that his doctor put him on a medication to get his blood right, he thinks that it is Iron.He says that this has caused his blood sugars to run high.  He wants to know if he should start taking the Metformin 500 mg - 2 by mouth twice a day, they were cut back at his last office visit.

## 2023-04-19 NOTE — Telephone Encounter (Signed)
Patient was called and made aware. He states that he will bring by his readings in the morning for [name] to review.

## 2023-04-19 NOTE — Telephone Encounter (Signed)
No, have him stay at 1 tablet (500 mg) po twice daily.  It is less likely to cause upset stomach and/or stomach ulcers at lower doses.  We may need to increase his insulin doses.  Can he send me the last 3 days worth of readings?

## 2023-04-20 NOTE — Telephone Encounter (Signed)
Patient was called and given [name]'s recommendation. 

## 2023-04-29 DIAGNOSIS — J449 Chronic obstructive pulmonary disease, unspecified: Secondary | ICD-10-CM | POA: Diagnosis not present

## 2023-04-29 DIAGNOSIS — G894 Chronic pain syndrome: Secondary | ICD-10-CM | POA: Diagnosis not present

## 2023-04-29 DIAGNOSIS — M1991 Primary osteoarthritis, unspecified site: Secondary | ICD-10-CM | POA: Diagnosis not present

## 2023-04-29 DIAGNOSIS — I10 Essential (primary) hypertension: Secondary | ICD-10-CM | POA: Diagnosis not present

## 2023-04-29 DIAGNOSIS — D649 Anemia, unspecified: Secondary | ICD-10-CM | POA: Diagnosis not present

## 2023-04-29 DIAGNOSIS — E1129 Type 2 diabetes mellitus with other diabetic kidney complication: Secondary | ICD-10-CM | POA: Diagnosis not present

## 2023-04-29 DIAGNOSIS — K922 Gastrointestinal hemorrhage, unspecified: Secondary | ICD-10-CM | POA: Diagnosis not present

## 2023-04-29 DIAGNOSIS — E1140 Type 2 diabetes mellitus with diabetic neuropathy, unspecified: Secondary | ICD-10-CM | POA: Diagnosis not present

## 2023-04-29 DIAGNOSIS — E1165 Type 2 diabetes mellitus with hyperglycemia: Secondary | ICD-10-CM | POA: Diagnosis not present

## 2023-05-16 ENCOUNTER — Other Ambulatory Visit: Payer: Self-pay | Admitting: Nurse Practitioner

## 2023-05-16 NOTE — Patient Instructions (Signed)
Patrick Taylor  05/16/2023     @PREFPERIOPPHARMACY @   Your procedure is scheduled on  05/19/2023.   Report to [facility] at  0715 A.M.   Call this number if you have problems the morning of surgery:  [phone]  If you experience any cold or flu symptoms such as cough, fever, chills, shortness of breath, etc. between now and your scheduled surgery, please notify us at the above number.   Remember:  Follow the diet instructions given to you by the office.     Take 9 units of your Novolog the night before your procedure.      DO NOT take any medications for diabetes the morning of your procedure.      Use your inhaler before you come and bring your rescue inhaler with you.     Take these medicines the morning of surgery with A SIP OF WATER            albuterol, gabapentin, hydrocodone(if needed), antivert(if needed).     Do not wear jewelry, make-up or nail polish, including gel polish,  artificial nails, or any other type of covering on natural nails (fingers and  toes).  Do not wear lotions, powders, or perfumes, or deodorant.  Do not shave 48 hours prior to surgery.  Men may shave face and neck.  Do not bring valuables to the hospital.  [facility] is not responsible for any belongings or valuables.  Contacts, dentures or bridgework may not be worn into surgery.  Leave your suitcase in the car.  After surgery it may be brought to your room.  For patients admitted to the hospital, discharge time will be determined by your treatment team.  Patients discharged the day of surgery will not be allowed to drive home.    Special instructions:   DO NOT smoke tobacco or vape for 24 hours before your procedure.  Please read over the following fact sheets that you were given. Anesthesia Post-op Instructions and Care and Recovery After Surgery      Upper Endoscopy, Adult, Care After After the procedure, it is common to have a sore throat. It is also common to  have: Mild stomach pain or discomfort. Bloating. Nausea. Follow these instructions at home: The instructions below may help you care for yourself at home. Your health care provider may give you more instructions. If you have questions, ask your health care provider. If you were given a sedative during the procedure, it can affect you for several hours. Do not drive or operate machinery until your health care provider says that it is safe. If you will be going home right after the procedure, plan to have a responsible adult: Take you home from the hospital or clinic. You will not be allowed to drive. Care for you for the time you are told. Follow instructions from your health care provider about what you may eat and drink. Return to your normal activities as told by your health care provider. Ask your health care provider what activities are safe for you. Take over-the-counter and prescription medicines only as told by your health care provider. Contact a health care provider if you: Have a sore throat that lasts longer than one day. Have trouble swallowing. Have a fever. Get help right away if you: Vomit blood or your vomit looks like coffee grounds. Have bloody, black, or tarry stools. Have a very bad sore throat or you cannot swallow. Have difficulty breathing  or very bad pain in your chest or abdomen. These symptoms may be an emergency. Get help right away. Call 911. Do not wait to see if the symptoms will go away. Do not drive yourself to the hospital. Summary After the procedure, it is common to have a sore throat, mild stomach discomfort, bloating, and nausea. If you were given a sedative during the procedure, it can affect you for several hours. Do not drive until your health care provider says that it is safe. Follow instructions from your health care provider about what you may eat and drink. Return to your normal activities as told by your health care provider. This  information is not intended to replace advice given to you by your health care provider. Make sure you discuss any questions you have with your health care provider. Document Revised: 03/10/2022 Document Reviewed: 03/10/2022 Elsevier Patient Education  2024 [employer] Monitored Anesthesia Care, Care After The following information offers guidance on how to care for yourself after your procedure. Your health care provider may also give you more specific instructions. If you have problems or questions, contact your health care provider. What can I expect after the procedure? After the procedure, it is common to have: Tiredness. Little or no memory about what happened during or after the procedure. Impaired judgment when it comes to making decisions. Nausea or vomiting. Some trouble with balance. Follow these instructions at home: For the time period you were told by your health care provider:  Rest. Do not participate in activities where you could fall or become injured. Do not drive or use machinery. Do not drink alcohol. Do not take sleeping pills or medicines that cause drowsiness. Do not make important decisions or sign legal documents. Do not take care of children on your own. Medicines Take over-the-counter and prescription medicines only as told by your health care provider. If you were prescribed antibiotics, take them as told by your health care provider. Do not stop using the antibiotic even if you start to feel better. Eating and drinking Follow instructions from your health care provider about what you may eat and drink. Drink enough fluid to keep your urine pale yellow. If you vomit: Drink clear fluids slowly and in small amounts as you are able. Clear fluids include water, ice chips, low-calorie sports drinks, and fruit juice that has water added to it (diluted fruit juice). Eat light and bland foods in small amounts as you are able. These foods include bananas,  applesauce, rice, lean meats, toast, and crackers. General instructions  Have a responsible adult stay with you for the time you are told. It is important to have someone help care for you until you are awake and alert. If you have sleep apnea, surgery and some medicines can increase your risk for breathing problems. Follow instructions from your health care provider about wearing your sleep device: When you are sleeping. This includes during daytime naps. While taking prescription pain medicines, sleeping medicines, or medicines that make you drowsy. Do not use any products that contain nicotine or tobacco. These products include cigarettes, chewing tobacco, and vaping devices, such as e-cigarettes. If you need help quitting, ask your health care provider. Contact a health care provider if: You feel nauseous or vomit every time you eat or drink. You feel light-headed. You are still sleepy or having trouble with balance after 24 hours. You get a rash. You have a fever. You have redness or swelling around the IV site. Get help  right away if: You have trouble breathing. You have new confusion after you get home. These symptoms may be an emergency. Get help right away. Call 911. Do not wait to see if the symptoms will go away. Do not drive yourself to the hospital. This information is not intended to replace advice given to you by your health care provider. Make sure you discuss any questions you have with your health care provider. Document Revised: 04/26/2022 Document Reviewed: 04/26/2022 Elsevier Patient Education  2024 [employer].

## 2023-05-17 ENCOUNTER — Encounter (HOSPITAL_COMMUNITY): Payer: Self-pay

## 2023-05-17 ENCOUNTER — Telehealth: Payer: Self-pay | Admitting: *Deleted

## 2023-05-17 ENCOUNTER — Encounter: Payer: Self-pay | Admitting: *Deleted

## 2023-05-17 ENCOUNTER — Encounter (HOSPITAL_COMMUNITY)
Admission: RE | Admit: 2023-05-17 | Discharge: 2023-05-17 | Disposition: A | Discharge status: Home / Self Care | Payer: Medicare HMO | Source: Ambulatory Visit | Attending: Internal Medicine | Admitting: Internal Medicine

## 2023-05-17 DIAGNOSIS — D649 Anemia, unspecified: Secondary | ICD-10-CM

## 2023-05-17 DIAGNOSIS — I10 Essential (primary) hypertension: Secondary | ICD-10-CM

## 2023-05-17 DIAGNOSIS — E1165 Type 2 diabetes mellitus with hyperglycemia: Secondary | ICD-10-CM

## 2023-05-17 NOTE — Telephone Encounter (Signed)
Attempted to call pt, vm is not set up, unable to leave message  no show Received: Today Patrick Amis, RN  Estudillo, Ewell Poe, CMA; Elinor Dodge, LPN; Marlowe Shores, LPN Hey ladies! Florestine Avers did not show for his pre-op this morning.

## 2023-05-17 NOTE — Telephone Encounter (Signed)
Pt showed up in office. Tried calling pre-op nurse. No answer. Message sent to see if he can be worked back in if he will need to reschedule.

## 2023-05-17 NOTE — Telephone Encounter (Signed)
Elsie Amis, RN  Shloma Roggenkamp S, CMA I only have 1 slot available at 1445. If he can't come then, we will have to reschedule him.

## 2023-05-17 NOTE — Telephone Encounter (Signed)
Pt informed to be there at 2:45 pm tomorrow for pre-op. Pt verbalized understanding.

## 2023-05-18 ENCOUNTER — Encounter (HOSPITAL_COMMUNITY)
Admission: RE | Admit: 2023-05-18 | Discharge: 2023-05-18 | Disposition: A | Discharge status: Home / Self Care | Payer: Medicare HMO | Source: Ambulatory Visit | Attending: Internal Medicine | Admitting: Internal Medicine

## 2023-05-18 ENCOUNTER — Encounter (HOSPITAL_COMMUNITY): Payer: Self-pay

## 2023-05-18 DIAGNOSIS — E1165 Type 2 diabetes mellitus with hyperglycemia: Secondary | ICD-10-CM

## 2023-05-18 DIAGNOSIS — I4510 Unspecified right bundle-branch block: Secondary | ICD-10-CM | POA: Diagnosis not present

## 2023-05-18 DIAGNOSIS — D649 Anemia, unspecified: Secondary | ICD-10-CM

## 2023-05-18 DIAGNOSIS — Z01818 Encounter for other preprocedural examination: Secondary | ICD-10-CM | POA: Insufficient documentation

## 2023-05-18 DIAGNOSIS — I10 Essential (primary) hypertension: Secondary | ICD-10-CM | POA: Insufficient documentation

## 2023-05-18 LAB — CBC WITH DIFFERENTIAL/PLATELET
Abs Immature Granulocytes: 0.02 10*3/uL (ref 0.00–0.07)
Basophils Absolute: 0.1 10*3/uL (ref 0.0–0.1)
Basophils Relative: 1 %
Eosinophils Absolute: 0.6 10*3/uL — ABNORMAL HIGH (ref 0.0–0.5)
Eosinophils Relative: 10 %
HCT: 40.8 % (ref 39.0–52.0)
Hemoglobin: 12.8 g/dL — ABNORMAL LOW (ref 13.0–17.0)
Immature Granulocytes: 0 %
Lymphocytes Relative: 35 %
Lymphs Abs: 2.2 10*3/uL (ref 0.7–4.0)
MCH: 26.6 pg (ref 26.0–34.0)
MCHC: 31.4 g/dL (ref 30.0–36.0)
MCV: 84.6 fL (ref 80.0–100.0)
Monocytes Absolute: 0.5 10*3/uL (ref 0.1–1.0)
Monocytes Relative: 8 %
Neutro Abs: 2.8 10*3/uL (ref 1.7–7.7)
Neutrophils Relative %: 46 %
Platelets: 343 10*3/uL (ref 150–400)
RBC: 4.82 MIL/uL (ref 4.22–5.81)
RDW: 13.1 % (ref 11.5–15.5)
WBC: 6.2 10*3/uL (ref 4.0–10.5)
nRBC: 0 % (ref 0.0–0.2)

## 2023-05-18 LAB — BASIC METABOLIC PANEL
Anion gap: 10 (ref 5–15)
BUN: 15 mg/dL (ref 8–23)
CO2: 28 mmol/L (ref 22–32)
Calcium: 9.4 mg/dL (ref 8.9–10.3)
Chloride: 99 mmol/L (ref 98–111)
Creatinine, Ser: 1.01 mg/dL (ref 0.61–1.24)
GFR, Estimated: >60 mL/min (ref >60)
Glucose, Bld: 106 mg/dL — ABNORMAL HIGH (ref 70–99)
Potassium: 4.5 mmol/L (ref 3.5–5.1)
Sodium: 137 mmol/L (ref 135–145)

## 2023-05-19 ENCOUNTER — Encounter (HOSPITAL_COMMUNITY): Payer: Self-pay | Admitting: Internal Medicine

## 2023-05-19 ENCOUNTER — Ambulatory Visit (HOSPITAL_COMMUNITY): Payer: Medicare HMO | Admitting: Anesthesiology

## 2023-05-19 ENCOUNTER — Ambulatory Visit (HOSPITAL_COMMUNITY)
Admission: RE | Admit: 2023-05-19 | Discharge: 2023-05-19 | Disposition: A | Discharge status: Home / Self Care | Payer: Medicare HMO | Attending: Internal Medicine | Admitting: Internal Medicine

## 2023-05-19 ENCOUNTER — Encounter (HOSPITAL_COMMUNITY)
Admission: RE | Disposition: A | Discharge status: Home / Self Care | Payer: Self-pay | Source: Home / Self Care | Attending: Internal Medicine

## 2023-05-19 ENCOUNTER — Ambulatory Visit (HOSPITAL_BASED_OUTPATIENT_CLINIC_OR_DEPARTMENT_OTHER): Payer: Medicare HMO | Admitting: Anesthesiology

## 2023-05-19 DIAGNOSIS — K922 Gastrointestinal hemorrhage, unspecified: Secondary | ICD-10-CM | POA: Insufficient documentation

## 2023-05-19 DIAGNOSIS — K31A0 Gastric intestinal metaplasia, unspecified: Secondary | ICD-10-CM | POA: Insufficient documentation

## 2023-05-19 DIAGNOSIS — I10 Essential (primary) hypertension: Secondary | ICD-10-CM | POA: Insufficient documentation

## 2023-05-19 DIAGNOSIS — K31A11 Gastric intestinal metaplasia without dysplasia, involving the antrum: Secondary | ICD-10-CM | POA: Diagnosis not present

## 2023-05-19 DIAGNOSIS — D759 Disease of blood and blood-forming organs, unspecified: Secondary | ICD-10-CM | POA: Insufficient documentation

## 2023-05-19 DIAGNOSIS — D649 Anemia, unspecified: Secondary | ICD-10-CM | POA: Diagnosis not present

## 2023-05-19 DIAGNOSIS — R195 Other fecal abnormalities: Secondary | ICD-10-CM | POA: Diagnosis not present

## 2023-05-19 DIAGNOSIS — Z791 Long term (current) use of non-steroidal anti-inflammatories (NSAID): Secondary | ICD-10-CM | POA: Diagnosis not present

## 2023-05-19 DIAGNOSIS — E119 Type 2 diabetes mellitus without complications: Secondary | ICD-10-CM | POA: Insufficient documentation

## 2023-05-19 DIAGNOSIS — Z87891 Personal history of nicotine dependence: Secondary | ICD-10-CM

## 2023-05-19 DIAGNOSIS — J449 Chronic obstructive pulmonary disease, unspecified: Secondary | ICD-10-CM | POA: Diagnosis not present

## 2023-05-19 HISTORY — PX: ESOPHAGOGASTRODUODENOSCOPY (EGD) WITH PROPOFOL: SHX5813

## 2023-05-19 HISTORY — PX: BIOPSY: SHX5522

## 2023-05-19 LAB — GLUCOSE, CAPILLARY: Glucose-Capillary: 121 mg/dL — ABNORMAL HIGH (ref 70–99)

## 2023-05-19 SURGERY — ESOPHAGOGASTRODUODENOSCOPY (EGD) WITH PROPOFOL
Anesthesia: General

## 2023-05-19 MED ORDER — PROPOFOL 10 MG/ML IV BOLUS
INTRAVENOUS | Status: DC | PRN
  Administered 2023-05-19: 100 mg via INTRAVENOUS

## 2023-05-19 MED ORDER — LACTATED RINGERS IV SOLN: INTRAVENOUS | Status: DC | PRN

## 2023-05-19 MED ORDER — LIDOCAINE HCL (CARDIAC) PF 100 MG/5ML IV SOSY
PREFILLED_SYRINGE | INTRAVENOUS | Status: DC | PRN
  Administered 2023-05-19: 50 mg via INTRAVENOUS

## 2023-05-19 MED ORDER — PROPOFOL 500 MG/50ML IV EMUL
INTRAVENOUS | Status: DC | PRN
  Administered 2023-05-19: 150 ug/kg/min via INTRAVENOUS

## 2023-05-19 MED ORDER — STERILE WATER FOR IRRIGATION IR SOLN
Status: DC | PRN
  Administered 2023-05-19: 60 mL

## 2023-05-19 NOTE — Op Note (Signed)
[facility] Patient Name: Patrick Taylor Procedure Date: 05/19/2023 9:00 AM MRN: [id number] Date of Birth: 1951-08-28 Attending MD: Gennette Pac , MD, [id number] CSN: [id number] Age: 72 Admit Type: Outpatient Procedure:                Upper GI endoscopy Indications:              Heme positive stool Providers:                Gennette Pac, MD, Jannett Celestine, RN, Angelica Ran, Zena Amos Referring MD:             Gennette Pac, MD Medicines:                Propofol per Anesthesia Complications:            No immediate complications. Estimated Blood Loss:     Estimated blood loss was minimal. Procedure:                Pre-Anesthesia Assessment:                           - Prior to the procedure, a History and Physical                            was performed, and patient medications and                            allergies were reviewed. The patient's tolerance of                            previous anesthesia was also reviewed. The risks                            and benefits of the procedure and the sedation                            options and risks were discussed with the patient.                            All questions were answered, and informed consent                            was obtained. Prior Anticoagulants: The patient has                            taken no anticoagulant or antiplatelet agents. ASA                            Grade Assessment: III - A patient with severe                            systemic disease. After reviewing the risks and  benefits, the patient was deemed in satisfactory                            condition to undergo the procedure.                           After obtaining informed consent, the endoscope was                            passed under direct vision. Throughout the                            procedure, the patient's blood pressure, pulse, and                             oxygen saturations were monitored continuously. The                            GIF-H190 ([id number]) scope was introduced through the                            mouth, and advanced to the second part of duodenum.                            The upper GI endoscopy was accomplished without                            difficulty. The patient tolerated the procedure                            well. Scope In: 9:21:16 AM Scope Out: 9:26:05 AM Total Procedure Duration: 0 hours 4 minutes 49 seconds  Findings:      The examined esophagus was normal. Streaky erythema in the gastric       antrum. No ulcer or infiltrating process. Patent pylorus.      The duodenal bulb and second portion of the duodenum were normal. Antral       biopsies taken for histologic study. Impression:               - Normal esophagus. Antral erosions?"status post                            biopsy.                           - Normal duodenal bulb and second portion of the                            duodenum. Moderate Sedation:      Moderate (conscious) sedation was personally administered by an       anesthesia professional. The following parameters were monitored: oxygen       saturation, heart rate, blood pressure, respiratory rate, EKG, adequacy       of pulmonary ventilation, and response to care. Recommendation:           - Patient has a contact number  available for                            emergencies. The signs and symptoms of potential                            delayed complications were discussed with the                            patient. Return to normal activities tomorrow.                            Written discharge instructions were provided to the                            patient.                           - Advance diet as tolerated. Follow-up on path.                            Further recommendations to follow. Procedure Code(s):        --- Professional ---                            [phone], Esophagogastroduodenoscopy, flexible,                            transoral; diagnostic, including collection of                            specimen(s) by brushing or washing, when performed                            (separate procedure) Diagnosis Code(s):        --- Professional ---                           R19.5, Other fecal abnormalities CPT copyright 2022 American Medical Association. All rights reserved. The codes documented in this report are preliminary and upon coder review may  be revised to meet current compliance requirements. Gerrit Friends. Kaula Klenke, MD Gennette Pac, MD 05/19/2023 9:34:41 AM This report has been signed electronically. Number of Addenda: 0

## 2023-05-19 NOTE — Discharge Instructions (Signed)
EGD Discharge instructions Please read the instructions outlined below and refer to this sheet in the next few weeks. These discharge instructions provide you with general information on caring for yourself after you leave the hospital. Your doctor may also give you specific instructions. While your treatment has been planned according to the most current medical practices available, unavoidable complications occasionally occur. If you have any problems or questions after discharge, please call your doctor. ACTIVITY You may resume your regular activity but move at a slower pace for the next 24 hours.  Take frequent rest periods for the next 24 hours.  Walking will help expel (get rid of) the air and reduce the bloated feeling in your abdomen.  No driving for 24 hours (because of the anesthesia (medicine) used during the test).  You may shower.  Do not sign any important legal documents or operate any machinery for 24 hours (because of the anesthesia used during the test).  NUTRITION Drink plenty of fluids.  You may resume your normal diet.  Begin with a light meal and progress to your normal diet.  Avoid alcoholic beverages for 24 hours or as instructed by your caregiver.  MEDICATIONS You may resume your normal medications unless your caregiver tells you otherwise.  WHAT YOU CAN EXPECT TODAY You may experience abdominal discomfort such as a feeling of fullness or "gas" pains.  FOLLOW-UP Your doctor will discuss the results of your test with you.  SEEK IMMEDIATE MEDICAL ATTENTION IF ANY OF THE FOLLOWING OCCUR: Excessive nausea (feeling sick to your stomach) and/or vomiting.  Severe abdominal pain and distention (swelling).  Trouble swallowing.  Temperature over 101 F (37.8 C).  Rectal bleeding or vomiting of blood.       Your stomach was mildly inflamed.  Biopsies were performed.  Further recommendations to follow once the pathology report is back for review (I did not see any  evidence of tumor)  At patient request, I called Stevyn Madden at [phone]  -  reviewed findings and recommendations

## 2023-05-19 NOTE — Transfer of Care (Signed)
Immediate Anesthesia Transfer of Care Note  Patient: Patrick Taylor  Procedure(s) Performed: ESOPHAGOGASTRODUODENOSCOPY (EGD) WITH PROPOFOL BIOPSY  Patient Location: Short Stay  Anesthesia Type:General  Level of Consciousness: drowsy  Airway & Oxygen Therapy: Patient Spontanous Breathing  Post-op Assessment: Report given to RN and Post -op Vital signs reviewed and stable  Post vital signs: Reviewed and stable  Last Vitals:  Vitals Value Taken Time  BP 112/52 05/19/23 0929  Temp 36.7 C 05/19/23 0929  Pulse 79 05/19/23 0929  Resp 16 05/19/23 0929  SpO2 99 % 05/19/23 0929    Last Pain:  Vitals:   05/19/23 0929  TempSrc: Oral  PainSc: 0-No pain         Complications: No notable events documented.

## 2023-05-19 NOTE — Anesthesia Procedure Notes (Signed)
Date/Time: 05/19/2023 9:22 AM  Performed by: Julian Reil, CRNAPre-anesthesia Checklist: Patient identified, Emergency Drugs available, Suction available and Patient being monitored Patient Re-evaluated:Patient Re-evaluated prior to induction Oxygen Delivery Method: Nasal cannula Induction Type: IV induction Placement Confirmation: positive ETCO2

## 2023-05-19 NOTE — Anesthesia Procedure Notes (Signed)
Date/Time: 05/19/2023 9:14 AM  Performed by: Julian Reil, CRNAPre-anesthesia Checklist: Patient identified, Emergency Drugs available, Suction available and Patient being monitored Patient Re-evaluated:Patient Re-evaluated prior to induction Oxygen Delivery Method: Nasal cannula Induction Type: IV induction Placement Confirmation: positive ETCO2

## 2023-05-19 NOTE — H&P (Signed)
@LOGO @   Primary Care Physician:  Elfredia Nevins, MD Primary Gastroenterologist:  Dr. Jena Gauss  Pre-Procedure History & Physical: HPI:  Patrick Taylor is a 72 y.o. male here for  further evaluation of anemia Hemoccult positive stool.  NSAID use.  Recent colonoscopy 2022 as outlined above.  Patient denies dysphagia.  Past Medical History:  Diagnosis Date   Anemia    Asthma    Carpal tunnel syndrome    Chronic back pain    Colon polyps    COPD (chronic obstructive pulmonary disease) (HCC)    DM (diabetes mellitus) (HCC)    HTN (hypertension)    Hyperlipidemia     Past Surgical History:  Procedure Laterality Date   ABDOMINAL HERNIA REPAIR     CATARACT EXTRACTION W/PHACO Right 10/02/2021   Procedure: CATARACT EXTRACTION PHACO AND INTRAOCULAR LENS PLACEMENT (IOC);  Surgeon: Fabio Pierce, MD;  Location: [facility];  Service: Ophthalmology;  Laterality: Right;  CDE: 4.57    CATARACT EXTRACTION W/PHACO Left 10/29/2021   Procedure: CATARACT EXTRACTION PHACO AND INTRAOCULAR LENS PLACEMENT LEFT EYE;  Surgeon: Fabio Pierce, MD;  Location: [facility];  Service: Ophthalmology;  Laterality: Left;  CDE 8.51   COLONOSCOPY  01/24/09   rectal polyp/adenomatous poylps/tubulovillous   COLONOSCOPY  03/02/2012   Procedure: COLONOSCOPY;  Surgeon: Corbin Ade, MD;  Location: [facility];  Service: Endoscopy;  Laterality: N/A;  11:00   COLONOSCOPY WITH PROPOFOL N/A 09/10/2021   Procedure: COLONOSCOPY WITH PROPOFOL;  Surgeon: Corbin Ade, MD;  Location: [facility];  Service: Endoscopy;  Laterality: N/A;  8:45am   POLYPECTOMY  09/10/2021   Procedure: POLYPECTOMY;  Surgeon: Corbin Ade, MD;  Location: [facility];  Service: Endoscopy;;    Prior to Admission medications   Medication Sig Start Date End Date Taking? Authorizing Provider  albuterol (PROVENTIL) 2 MG tablet Take 2 mg by mouth 3 (three) times daily. 02/01/23  Yes [provider]  albuterol (VENTOLIN HFA) 108 (90 Base)  MCG/ACT inhaler Inhale 1-2 puffs into the lungs every 6 (six) hours as needed. 01/01/20  Yes [provider]  aspirin 81 MG tablet Take 81 mg by mouth once a week.   Yes [provider]  blood glucose meter kit and supplies KIT Dispense based on patient and insurance preference. Use 3 times daily as directed (before breakfast, before supper, before bed). Dx e11.9 08/23/22  Yes Dani Gobble, NP  Blood Glucose Monitoring Suppl (ONE TOUCH ULTRA 2) w/Device KIT Use to test blood glucose 3 times daily. E11.65 11/30/22  Yes Reardon, Freddi Starr, NP  Budeson-Glycopyrrol-Formoterol (BREZTRI AEROSPHERE) 160-9-4.8 MCG/ACT AERO Inhale into the lungs. 06/04/21  Yes [provider]  celecoxib (CELEBREX) 200 MG capsule Take 200 mg by mouth 2 (two) times daily as needed. 06/25/21  Yes [provider]  enalapril (VASOTEC) 20 MG tablet Take 1 tablet (20 mg total) by mouth daily. 12/24/15  Yes Nida, Denman George, MD  furosemide (LASIX) 20 MG tablet Take 20 mg by mouth daily.  01/22/12  Yes [provider]  gabapentin (NEURONTIN) 300 MG capsule Take 300 mg by mouth 3 (three) times daily. 5 CAPS daily   Yes [provider]  glucose blood (ONETOUCH ULTRA) test strip USE AS INSTRUCTED TO TEST BLOOD GLUCOSE Three TIMES DAILY E11.65 11/30/22  Yes Reardon, Alphonzo Lemmings J, NP  glucose blood test strip 1 each by Other route 3 (three) times daily. Use as instructed tid E11.65 Accu-chek guide 03/08/23  Yes Lurlean Leyden, [employer]  J, NP  HYDROcodone-acetaminophen (NORCO) 10-325 MG tablet Take 1 tablet by mouth 4 (four) times daily as needed. 02/01/22  Yes [provider]  Insulin Pen Needle (B-D UF III MINI PEN NEEDLES) 31G X 5 MM MISC USE FOR INSULIN INJECTIONS 2 TIMES A DAY 03/08/23  Yes Reardon, Freddi Starr, NP  Lancets (ONETOUCH ULTRASOFT) lancets Use as instructed to test blood glucose 4 times daily. 12/17/21  Yes Dani Gobble, NP  meclizine (ANTIVERT) 25 MG tablet Take 25 mg  by mouth 3 (three) times daily as needed. 08/11/20  Yes [provider]  metFORMIN (GLUCOPHAGE) 500 MG tablet Take 1 tablet (500 mg total) by mouth 2 (two) times daily with a meal. 04/11/23  Yes Reardon, [name] J, NP  NOVOLOG MIX 70/30 FLEXPEN (70-30) 100 UNIT/ML FlexPen INJECT 18 UNITS ONCE DAILY IN THE MORNING AND 18 UNITS INTO THE SKIN AT BEDTIME 05/17/23  Yes Dani Gobble, NP  pravastatin (PRAVACHOL) 40 MG tablet Take 1 tablet by mouth once daily 10/18/22  Yes Reardon, Freddi Starr, NP  Continuous Glucose Sensor (FREESTYLE LIBRE 3 SENSOR) MISC Change sensor every 14 days 04/13/23   Dani Gobble, NP    Allergies as of 04/11/2023   (No Known Allergies)    Family History  Problem Relation Age of Onset   Colon cancer Other     Social History   Socioeconomic History   Marital status: Married    Spouse name: Not on file   Number of children: Not on file   Years of education: Not on file   Highest education level: Not on file  Occupational History   Occupation: works at [employer]  Tobacco Use   Smoking status: Former    Packs/day: 1.00    Years: 20.00    Additional pack years: 0.00    Total pack years: 20.00    Types: Cigarettes    Quit date: 02/18/1999    Years since quitting: 24.2   Smokeless tobacco: Never  Vaping Use   Vaping Use: Never used  Substance and Sexual Activity   Alcohol use: No    Comment: no heavy use in past   Drug use: No   Sexual activity: Not Currently  Other Topics Concern   Not on file  Social History Narrative   Not on file   Social Determinants of Health   Financial Resource Strain: Not on file  Food Insecurity: Not on file  Transportation Needs: Not on file  Physical Activity: Not on file  Stress: Not on file  Social Connections: Not on file  Intimate Partner Violence: Not on file    Review of Systems: See HPI, otherwise negative ROS  Physical Exam: BP (!) 158/73   Pulse 80   Temp 98.2 F (36.8 C) (Oral)   Resp 16    Ht 5\' 2"  (1.575 m)   Wt 96 kg   SpO2 99%   BMI 38.71 kg/m  General:   Alert,  Well-developed, well-nourished, pleasant and cooperative in NAD al adenopathy. Lungs:  Clear throughout to auscultation.   No wheezes, crackles, or rhonchi. No acute distress. Heart:  Regular rate and rhythm; no murmurs, clicks, rubs,  or gallops. Abdomen: Non-distended, normal bowel sounds.  Soft and nontender without appreciable mass or hepatosplenomegaly.  Impression/Plan:    72 year old gentleman with anemia, Hemoccult positive stool.  NSAID use.  Colonoscopy up-to-date for age.    I have offered the patient a diagnostic colonoscopy today per plan.The risks, benefits, limitations, alternatives and  imponderables have been reviewed with the patient. Potential for esophageal dilation, biopsy, etc. have also been reviewed.  Questions have been answered. All parties agreeable.      Notice: This dictation was prepared with Dragon dictation along with smaller phrase technology. Any transcriptional errors that result from this process are unintentional and may not be corrected upon review.

## 2023-05-19 NOTE — Anesthesia Preprocedure Evaluation (Signed)
Anesthesia Evaluation  Patient identified by MRN, date of birth, ID band Patient awake    Reviewed: Allergy & Precautions, H&P , NPO status , Patient's Chart, lab work & pertinent test results, reviewed documented beta blocker date and time   Airway Mallampati: II  TM Distance: >3 FB Neck ROM: full    Dental no notable dental hx.    Pulmonary neg pulmonary ROS, asthma , COPD, former smoker   Pulmonary exam normal breath sounds clear to auscultation       Cardiovascular Exercise Tolerance: Good hypertension, negative cardio ROS  Rhythm:regular Rate:Normal     Neuro/Psych  Neuromuscular disease negative neurological ROS  negative psych ROS   GI/Hepatic negative GI ROS, Neg liver ROS,,,  Endo/Other  negative endocrine ROSdiabetes, Type 2    Renal/GU negative Renal ROS  negative genitourinary   Musculoskeletal   Abdominal   Peds  Hematology negative hematology ROS (+) Blood dyscrasia, anemia   Anesthesia Other Findings   Reproductive/Obstetrics negative OB ROS                             Anesthesia Physical Anesthesia Plan  ASA: 3  Anesthesia Plan: General   Post-op Pain Management:    Induction:   PONV Risk Score and Plan: Propofol infusion  Airway Management Planned:   Additional Equipment:   Intra-op Plan:   Post-operative Plan:   Informed Consent: I have reviewed the patients History and Physical, chart, labs and discussed the procedure including the risks, benefits and alternatives for the proposed anesthesia with the patient or authorized representative who has indicated his/her understanding and acceptance.     Dental Advisory Given  Plan Discussed with: CRNA  Anesthesia Plan Comments:        Anesthesia Quick Evaluation

## 2023-05-20 ENCOUNTER — Encounter: Payer: Self-pay | Admitting: Internal Medicine

## 2023-05-20 LAB — SURGICAL PATHOLOGY

## 2023-05-20 NOTE — Anesthesia Postprocedure Evaluation (Signed)
Anesthesia Post Note  Patient: Patrick Taylor  Procedure(s) Performed: ESOPHAGOGASTRODUODENOSCOPY (EGD) WITH PROPOFOL BIOPSY  Patient location during evaluation: Phase II Anesthesia Type: General Level of consciousness: awake Pain management: pain level controlled Vital Signs Assessment: post-procedure vital signs reviewed and stable Respiratory status: spontaneous breathing and respiratory function stable Cardiovascular status: blood pressure returned to baseline and stable Postop Assessment: no headache and no apparent nausea or vomiting Anesthetic complications: no Comments: Late entry   No notable events documented.   Last Vitals:  Vitals:   05/19/23 0705 05/19/23 0929  BP: (!) 158/73 (!) 112/52  Pulse: 80 79  Resp: 16 16  Temp: 36.8 C 36.7 C  SpO2: 99% 99%    Last Pain:  Vitals:   05/19/23 0929  TempSrc: Oral  PainSc: 0-No pain                 Windell Norfolk

## 2023-05-23 ENCOUNTER — Telehealth: Payer: Self-pay | Admitting: *Deleted

## 2023-05-23 ENCOUNTER — Telehealth: Payer: Self-pay | Admitting: Nurse Practitioner

## 2023-05-23 NOTE — Telephone Encounter (Signed)
Patient left a message. He is concerned about blood sugars they hare high in the mornings and even higher at lunch time. Patient was called but he was a the center getting ready for lunch. He will be bringing by his papers with his readings on them about 2:30 pm today.

## 2023-05-23 NOTE — Telephone Encounter (Signed)
Patient was called and made aware. 

## 2023-05-26 ENCOUNTER — Encounter (HOSPITAL_COMMUNITY): Payer: Self-pay | Admitting: Internal Medicine

## 2023-07-01 ENCOUNTER — Other Ambulatory Visit: Payer: Self-pay | Admitting: Nurse Practitioner

## 2023-07-04 ENCOUNTER — Other Ambulatory Visit: Payer: Self-pay | Admitting: Nurse Practitioner

## 2023-07-04 MED ORDER — NOVOLOG 70/30 FLEXPEN RELION (70-30) 100 UNIT/ML ~~LOC~~ SUPN: 20.0000 [IU] | PEN_INJECTOR | Freq: Two times a day (BID) | SUBCUTANEOUS | 3 refills | Status: DC

## 2023-07-04 NOTE — Telephone Encounter (Signed)
I dont see any alternatives listed.  We can change to 75/25 at same dosage if that is covered by his insurance.

## 2023-07-07 DIAGNOSIS — E1165 Type 2 diabetes mellitus with hyperglycemia: Secondary | ICD-10-CM | POA: Diagnosis not present

## 2023-07-07 DIAGNOSIS — E6609 Other obesity due to excess calories: Secondary | ICD-10-CM | POA: Diagnosis not present

## 2023-07-07 DIAGNOSIS — J449 Chronic obstructive pulmonary disease, unspecified: Secondary | ICD-10-CM | POA: Diagnosis not present

## 2023-07-07 DIAGNOSIS — Z794 Long term (current) use of insulin: Secondary | ICD-10-CM | POA: Diagnosis not present

## 2023-07-07 DIAGNOSIS — E1140 Type 2 diabetes mellitus with diabetic neuropathy, unspecified: Secondary | ICD-10-CM | POA: Diagnosis not present

## 2023-07-07 DIAGNOSIS — G894 Chronic pain syndrome: Secondary | ICD-10-CM | POA: Diagnosis not present

## 2023-07-07 DIAGNOSIS — I10 Essential (primary) hypertension: Secondary | ICD-10-CM | POA: Diagnosis not present

## 2023-07-07 DIAGNOSIS — M1991 Primary osteoarthritis, unspecified site: Secondary | ICD-10-CM | POA: Diagnosis not present

## 2023-07-07 DIAGNOSIS — E1129 Type 2 diabetes mellitus with other diabetic kidney complication: Secondary | ICD-10-CM | POA: Diagnosis not present

## 2023-07-08 ENCOUNTER — Other Ambulatory Visit: Payer: Self-pay

## 2023-07-08 MED ORDER — NOVOLOG 70/30 FLEXPEN RELION (70-30) 100 UNIT/ML ~~LOC~~ SUPN: 20.0000 [IU] | PEN_INJECTOR | Freq: Two times a day (BID) | SUBCUTANEOUS | 0 refills | Status: DC

## 2023-07-13 ENCOUNTER — Telehealth: Payer: Self-pay | Admitting: Nurse Practitioner

## 2023-07-13 MED ORDER — NOVOLOG 70/30 FLEXPEN RELION (70-30) 100 UNIT/ML ~~LOC~~ SUPN: 20.0000 [IU] | PEN_INJECTOR | Freq: Two times a day (BID) | SUBCUTANEOUS | 0 refills | Status: DC

## 2023-07-13 NOTE — Telephone Encounter (Signed)
Pt is stating that insurance wants to fill prescription for 90 days which is over $100 dollars.  He wants it for 30 days.  Is this possible?

## 2023-07-13 NOTE — Telephone Encounter (Signed)
Rx sent 

## 2023-07-19 DIAGNOSIS — D649 Anemia, unspecified: Secondary | ICD-10-CM | POA: Diagnosis not present

## 2023-08-02 ENCOUNTER — Other Ambulatory Visit: Payer: Self-pay | Admitting: Nurse Practitioner

## 2023-08-02 ENCOUNTER — Telehealth: Payer: Self-pay | Admitting: Nurse Practitioner

## 2023-08-02 MED ORDER — NOVOLOG 70/30 FLEXPEN RELION (70-30) 100 UNIT/ML ~~LOC~~ SUPN: 25.0000 [IU] | PEN_INJECTOR | Freq: Two times a day (BID) | SUBCUTANEOUS | 3 refills | Status: DC

## 2023-08-02 NOTE — Telephone Encounter (Signed)
Pt states the pharmacy is asking if we can 25 units instead of 20 so he can get 5 vials. Please Advise

## 2023-08-02 NOTE — Telephone Encounter (Signed)
I put that in for him.  [facility] sent [facility] to [facility].

## 2023-08-03 ENCOUNTER — Other Ambulatory Visit: Payer: Self-pay

## 2023-08-03 MED ORDER — INSULIN ASPART PROT & ASPART (70-30 MIX) 100 UNIT/ML ~~LOC~~ SUSP: 25.0000 [IU] | Freq: Two times a day (BID) | SUBCUTANEOUS | 3 refills | Status: DC

## 2023-08-11 ENCOUNTER — Ambulatory Visit (INDEPENDENT_AMBULATORY_CARE_PROVIDER_SITE_OTHER): Payer: Medicare HMO | Admitting: Nurse Practitioner

## 2023-08-11 ENCOUNTER — Encounter: Payer: Self-pay | Admitting: Nurse Practitioner

## 2023-08-11 VITALS — BP 165/68 | HR 66 | Ht 62.0 in | Wt 213.4 lb

## 2023-08-11 DIAGNOSIS — Z794 Long term (current) use of insulin: Secondary | ICD-10-CM | POA: Diagnosis not present

## 2023-08-11 DIAGNOSIS — I10 Essential (primary) hypertension: Secondary | ICD-10-CM

## 2023-08-11 DIAGNOSIS — E782 Mixed hyperlipidemia: Secondary | ICD-10-CM | POA: Diagnosis not present

## 2023-08-11 DIAGNOSIS — E559 Vitamin D deficiency, unspecified: Secondary | ICD-10-CM | POA: Diagnosis not present

## 2023-08-11 DIAGNOSIS — Z7984 Long term (current) use of oral hypoglycemic drugs: Secondary | ICD-10-CM | POA: Diagnosis not present

## 2023-08-11 DIAGNOSIS — E1165 Type 2 diabetes mellitus with hyperglycemia: Secondary | ICD-10-CM | POA: Diagnosis not present

## 2023-08-11 LAB — POCT UA - MICROALBUMIN
Albumin/Creatinine Ratio, Urine, POC: 30
Microalbumin Ur, POC: 10 mg/L

## 2023-08-11 LAB — POCT GLYCOSYLATED HEMOGLOBIN (HGB A1C): Hemoglobin A1C: 8.5 % — AB (ref 4.0–5.6)

## 2023-08-11 NOTE — Progress Notes (Signed)
08/11/2023   Endocrinology follow-up note   Subjective:    Patient ID: Patrick Taylor, male    DOB: [birth year],    Past Medical History:  Diagnosis Date   Anemia    Asthma    Carpal tunnel syndrome    Chronic back pain    Colon polyps    COPD (chronic obstructive pulmonary disease) (HCC)    DM (diabetes mellitus) (HCC)    HTN (hypertension)    Hyperlipidemia    Past Surgical History:  Procedure Laterality Date   ABDOMINAL HERNIA REPAIR     BIOPSY  05/19/2023   Procedure: BIOPSY;  Surgeon: Corbin Ade, MD;  Location: [facility];  Service: Endoscopy;;   CATARACT EXTRACTION W/PHACO Right 10/02/2021   Procedure: CATARACT EXTRACTION PHACO AND INTRAOCULAR LENS PLACEMENT (IOC);  Surgeon: Fabio Pierce, MD;  Location: [facility];  Service: Ophthalmology;  Laterality: Right;  CDE: 4.57    CATARACT EXTRACTION W/PHACO Left 10/29/2021   Procedure: CATARACT EXTRACTION PHACO AND INTRAOCULAR LENS PLACEMENT LEFT EYE;  Surgeon: Fabio Pierce, MD;  Location: [facility];  Service: Ophthalmology;  Laterality: Left;  CDE 8.51   COLONOSCOPY  01/24/09   rectal polyp/adenomatous poylps/tubulovillous   COLONOSCOPY  03/02/2012   Procedure: COLONOSCOPY;  Surgeon: Corbin Ade, MD;  Location: [facility];  Service: Endoscopy;  Laterality: N/A;  11:00   COLONOSCOPY WITH PROPOFOL N/A 09/10/2021   Procedure: COLONOSCOPY WITH PROPOFOL;  Surgeon: Corbin Ade, MD;  Location: [facility];  Service: Endoscopy;  Laterality: N/A;  8:45am   ESOPHAGOGASTRODUODENOSCOPY (EGD) WITH PROPOFOL N/A 05/19/2023   Procedure: ESOPHAGOGASTRODUODENOSCOPY (EGD) WITH PROPOFOL;  Surgeon: Corbin Ade, MD;  [facility]: [facility];  Service: Endoscopy;  Laterality: N/A;  915am, asa 3   POLYPECTOMY  09/10/2021   Procedure: POLYPECTOMY;  Surgeon: Corbin Ade, MD;  Location: [facility];  Service: Endoscopy;;   Social History   Socioeconomic History   Marital status: Married    Spouse name: Not on file   Number  of children: Not on file   Years of education: Not on file   Highest education level: Not on file  Occupational History   Occupation: works at [employer]  Tobacco Use   Smoking status: Former    Current packs/day: 0.00    Average packs/day: 1 pack/day for 20.0 years (20.0 ttl pk-yrs)    Types: Cigarettes    Start date: 02/18/1979    Quit date: 02/18/1999    Years since quitting: 24.4   Smokeless tobacco: Never  Vaping Use   Vaping status: Never Used  Substance and Sexual Activity   Alcohol use: No    Comment: no heavy use in past   Drug use: No   Sexual activity: Not Currently  Other Topics Concern   Not on file  Social History Narrative   Not on file   Social Determinants of Health   Financial Resource Strain: Not on file  Food Insecurity: Not on file  Transportation Needs: Not on file  Physical Activity: Not on file  Stress: Not on file  Social Connections: Not on file   Outpatient Encounter Medications as of 08/11/2023  Medication Sig   albuterol (PROVENTIL) 2 MG tablet Take 2 mg by mouth 3 (three) times daily.   albuterol (VENTOLIN HFA) 108 (90 Base) MCG/ACT inhaler Inhale 1-2 puffs into the lungs every 6 (six) hours as needed.   aspirin 81 MG tablet Take 81 mg by mouth once a week.  blood glucose meter kit and supplies KIT Dispense based on patient and insurance preference. Use 3 times daily as directed (before breakfast, before supper, before bed). Dx e11.9   Blood Glucose Monitoring Suppl (ONE TOUCH ULTRA 2) w/Device KIT Use to test blood glucose 3 times daily. E11.65   Budeson-Glycopyrrol-Formoterol (BREZTRI AEROSPHERE) 160-9-4.8 MCG/ACT AERO Inhale into the lungs.   celecoxib (CELEBREX) 200 MG capsule Take 200 mg by mouth 2 (two) times daily as needed.   Continuous Glucose Sensor (FREESTYLE LIBRE 3 SENSOR) MISC Change sensor every 14 days   enalapril (VASOTEC) 20 MG tablet Take 1 tablet (20 mg total) by mouth daily.   furosemide (LASIX) 20 MG tablet Take 20 mg by  mouth daily.    gabapentin (NEURONTIN) 300 MG capsule Take 300 mg by mouth 3 (three) times daily. 5 CAPS daily   glucose blood (ONETOUCH ULTRA) test strip USE AS INSTRUCTED TO TEST BLOOD GLUCOSE Three TIMES DAILY E11.65   glucose blood test strip 1 each by Other route 3 (three) times daily. Use as instructed tid E11.65 Accu-chek guide   HYDROcodone-acetaminophen (NORCO) 10-325 MG tablet Take 1 tablet by mouth 4 (four) times daily as needed.   insulin aspart protamine- aspart (NOVOLOG MIX 70/30) (70-30) 100 UNIT/ML injection Inject 0.25 mLs (25 Units total) into the skin 2 (two) times daily with a meal.   Insulin Pen Needle (B-D UF III MINI PEN NEEDLES) 31G X 5 MM MISC USE FOR INSULIN INJECTIONS 2 TIMES A DAY   Lancets (ONETOUCH ULTRASOFT) lancets Use as instructed to test blood glucose 4 times daily.   meclizine (ANTIVERT) 25 MG tablet Take 25 mg by mouth 3 (three) times daily as needed.   metFORMIN (GLUCOPHAGE) 500 MG tablet Take 1 tablet (500 mg total) by mouth 2 (two) times daily with a meal.   pravastatin (PRAVACHOL) 40 MG tablet Take 1 tablet by mouth once daily   [DISCONTINUED] Insulin Aspart (NOVOLOG FLEXPEN [place]) Inject into the skin. (Patient not taking: Reported on 08/11/2023)   No facility-administered encounter medications on file as of 08/11/2023.   ALLERGIES: No Known Allergies VACCINATION STATUS: Immunization History  Administered Date(s) Administered   Moderna Sars-Covid-2 Vaccination 02/29/2020, 04/02/2020    Diabetes He presents for his follow-up diabetic visit. He has type 2 diabetes mellitus. Onset time: He was diagnosed at approximate age of 41 years. His disease course has been fluctuating. Hypoglycemia symptoms include sweats and tremors. Pertinent negatives for hypoglycemia include no confusion, headaches, pallor or seizures. Pertinent negatives for diabetes include no chest pain, no fatigue, no polydipsia, no polyphagia, no polyuria and no weakness. There are no  hypoglycemic complications. Symptoms are stable. Diabetic complications include nephropathy. Risk factors for coronary artery disease include dyslipidemia, diabetes mellitus, hypertension, sedentary lifestyle, male sex, obesity, tobacco exposure and family history. Current diabetic treatment includes insulin injections and oral agent (monotherapy). He is compliant with treatment most of the time. His weight is fluctuating minimally. He is following a generally healthy diet. When asked about meal planning, he reported none. He has not had a previous visit with a dietitian. He rarely participates in exercise. His home blood glucose trend is increasing steadily. His breakfast blood glucose range is generally 140-180 mg/dl. His lunch blood glucose range is generally 140-180 mg/dl. His dinner blood glucose range is generally 140-180 mg/dl. (He presents today, accompanied by his wife, with his logs showing slightly above target glycemic profile overall.  His POCT A1c today is 8.5%, increasing from last visit of 7.7%.  He notes his glucose has been high this summer, thinks it is related to the heat.  He did have 1 hypoglycemic event documented, glucose dropped to 58 before lunch.  This is a rare occurrence.) An ACE inhibitor/angiotensin II receptor blocker is being taken. He does not see a podiatrist.Eye exam is current.  Hyperlipidemia This is a chronic problem. The current episode started more than 1 year ago. The problem is uncontrolled. Recent lipid tests were reviewed and are high. Exacerbating diseases include chronic renal disease, diabetes and obesity. There are no known factors aggravating his hyperlipidemia. Pertinent negatives include no chest pain, myalgias or shortness of breath. Current antihyperlipidemic treatment includes statins. The current treatment provides moderate improvement of lipids. Compliance problems include adherence to diet and adherence to exercise.  Risk factors for coronary artery disease  include diabetes mellitus, male sex, a sedentary lifestyle, obesity, hypertension and dyslipidemia.  Hypertension This is a chronic problem. The current episode started more than 1 year ago. The problem has been gradually improving since onset. The problem is uncontrolled. Associated symptoms include sweats. Pertinent negatives include no chest pain, headaches, neck pain, palpitations or shortness of breath. There are no associated agents to hypertension. Risk factors for coronary artery disease include dyslipidemia, diabetes mellitus, obesity, male gender, sedentary lifestyle and smoking/tobacco exposure. Past treatments include ACE inhibitors and diuretics. The current treatment provides mild improvement. There are no compliance problems.  Identifiable causes of hypertension include chronic renal disease.    Review of systems  Constitutional: + Minimally fluctuating body weight,  current Body mass index is 39.03 kg/m. , no fatigue, no subjective hyperthermia, no subjective hypothermia Eyes: no blurry vision, no xerophthalmia ENT: no sore throat, no nodules palpated in throat, no dysphagia/odynophagia, no hoarseness Cardiovascular: no chest pain, no shortness of breath, no palpitations, no leg swelling Respiratory: no cough, no shortness of breath Gastrointestinal: no nausea/vomiting/diarrhea Musculoskeletal: no muscle/joint aches Skin: no rashes, no hyperemia Neurological: no tremors, no numbness, no tingling, no dizziness Psychiatric: no depression, no anxiety   Objective:    BP (!) 165/68 (BP Location: Left Arm, Patient Position: Sitting, Cuff Size: Large)   Pulse 66   Ht 5\' 2"  (1.575 m)   Wt 213 lb 6.4 oz (96.8 kg)   BMI 39.03 kg/m   Wt Readings from Last 3 Encounters:  08/11/23 213 lb 6.4 oz (96.8 kg)  05/19/23 211 lb 10.3 oz (96 kg)  05/18/23 210 lb 15.7 oz (95.7 kg)    BP Readings from Last 3 Encounters:  08/11/23 (!) 165/68  05/19/23 (!) 112/52  05/18/23 (!) 144/66      Physical Exam- Limited  Constitutional:  Body mass index is 39.03 kg/m. , not in acute distress, normal state of mind Eyes:  EOMI, no exophthalmos Musculoskeletal: no gross deformities, strength intact in all four extremities, no gross restriction of joint movements Skin:  no rashes, no hyperemia Neurological: no tremor with outstretched hands   Diabetic Foot Exam - Simple   Simple Foot Form Diabetic Foot exam was performed with the following findings: Yes 08/11/2023  8:33 AM  Visual Inspection See comments: Yes Sensation Testing Intact to touch and monofilament testing bilaterally: Yes Pulse Check Posterior Tibialis and Dorsalis pulse intact bilaterally: Yes Comments Bunion bilaterally     Results for orders placed or performed in visit on 08/11/23  HgB A1c  Result Value Ref Range   Hemoglobin A1C 8.5 (A) 4.0 - 5.6 %   HbA1c POC (<> result, manual entry)  HbA1c, POC (prediabetic range)     HbA1c, POC (controlled diabetic range)    POCT UA - Microalbumin  Result Value Ref Range   Microalbumin Ur, POC 10 mg/L mg/L   Creatinine, POC 50mg /dL mg/dL   Albumin/Creatinine Ratio, Urine, POC ,30 mg/G   Diabetic Labs (most recent): Lab Results  Component Value Date   HGBA1C 8.5 (A) 08/11/2023   HGBA1C 7.7 (A) 04/11/2023   HGBA1C 7.5 (A) 10/26/2022   MICROALBUR 10 mg/L 08/11/2023   MICROALBUR 10 06/17/2021   MICROALBUR <3.0 06/05/2020    Lipid Panel     Component Value Date/Time   CHOL 174 06/10/2021 0824   TRIG 182 (H) 06/10/2021 0824   HDL 41 06/10/2021 0824   CHOLHDL 4.2 06/10/2021 0824   LDLCALC 101 (H) 06/10/2021 0824      Latest Ref Rng & Units 05/18/2023    2:33 PM 10/18/2022    7:48 AM 02/15/2022    8:15 AM  CMP  Glucose 70 - 99 mg/dL 956  64  66   BUN 8 - 23 mg/dL 15  15  14    Creatinine 0.61 - 1.24 mg/dL 2.13  0.86  5.78   Sodium 135 - 145 mmol/L 137  145  143   Potassium 3.5 - 5.1 mmol/L 4.5  4.5  4.2   Chloride 98 - 111 mmol/L 99  104  103    CO2 22 - 32 mmol/L 28  25  27    Calcium 8.9 - 10.3 mg/dL 9.4  46.9  62.9   Total Protein 6.0 - 8.5 g/dL  7.1  6.9   Total Bilirubin 0.0 - 1.2 mg/dL  0.3  0.3   Alkaline Phos 44 - 121 IU/L  64  76   AST 0 - 40 IU/L  23  23   ALT 0 - 44 IU/L  7  12      Assessment & Plan:   1) Uncontrolled type 2 diabetes mellitus without complication, with long-term current use of insulin (HCC)  He presents today, accompanied by his wife, with his logs showing slightly above target glycemic profile overall.  His POCT A1c today is 8.5%, increasing from last visit of 7.7%.  He notes his glucose has been high this summer, thinks it is related to the heat.  He did have 1 hypoglycemic event documented, glucose dropped to 58 before lunch.  This is a rare occurrence.   Glucose logs and insulin administration records pertaining to this visit,  to be scanned into patient's records.  Recent labs reviewed.  - Patient remains at a high risk for more acute and chronic complications of diabetes which include CAD, CVA, CKD, retinopathy, and neuropathy. These are all discussed in detail with the patient.  - Nutritional counseling repeated at each appointment due to patients tendency to fall back in to old habits.  - The patient admits there is a room for improvement in their diet and drink choices. -  Suggestion is made for the patient to avoid simple carbohydrates from their diet including Cakes, Sweet Desserts / Pastries, Ice Cream, Soda (diet and regular), Sweet Tea, Candies, Chips, Cookies, Sweet Pastries, Store Bought Juices, Alcohol in Excess of 1-2 drinks a day, Artificial Sweeteners, Coffee Creamer, and "Sugar-free" Products. This will help patient to have stable blood glucose profile and potentially avoid unintended weight gain.   - I encouraged the patient to switch to unprocessed or minimally processed complex starch and increased protein intake (animal or plant source), fruits, and  vegetables.   - Patient is  advised to stick to a routine mealtimes to eat 3 meals a day and avoid unnecessary snacks (to snack only to correct hypoglycemia).  - I have approached patient with the following individualized plan to manage diabetes and patient agrees.  -He has responded very well to premixed insulin using Novolin 70/30.   -He is advised to increase his morning dose of 70/30 to 25 units and keep 20 units at supper (if glucose is above 90 and he is eating).   He is advised to continue his Metformin 500 mg po twice daily after meals.    -He is encouraged to monitor glucose at least 3 times daily, before injecting insulin (at breakfast and supper) and before bed, and call the clinic if he has readings less than 70 or greater than 200 for 3 days out of the week.  He cannot afford the copay for CGM thus prefers to stay with traditional fingersticks.  - Patient specific target  for A1c; LDL, HDL, Triglycerides, and  Waist Circumference were discussed in detail.  2) BP/HTN:  His blood pressure is controlled to target.  He is advised to continue Enalapril 20 mg po daily and Lasix 20 mg po daily.      3) Lipids/HPL:  His most recent lipid panel from 02/03/23 shows controlled LDL at 84.  He is advised to continue his Pravastatin 40 mg po daily at bedtime.  Side effects and precautions discussed with patient.    4)  Weight/Diet:  His Body mass index is 39.03 kg/m.--clearly complicating his diabetes care.  He is a candidate for modest weight loss.  CDE consult in progress, exercise, and carbohydrates information provided.  5) Chronic Care/Health Maintenance: -Patient is on ACEI and Statin medications and encouraged to continue to follow up with Ophthalmology, Podiatrist at least yearly or according to recommendations, and advised to stay away from smoking. I have recommended yearly flu vaccine and pneumonia vaccination at least every 5 years; moderate intensity exercise for up to 150 minutes weekly; and  sleep for at  least 7 hours a day.  6) Vitamin D deficiency -His recent vitamin D level was 66.2 on 10/18/22.  He has been taking OTC Vitamin D3 5000 units daily as maintenance dose.     I advised patient to maintain close follow up with his PCP for primary care needs.     I spent  40  minutes in the care of the patient today including review of labs from CMP, Lipids, Thyroid Function, Hematology (current and previous including abstractions from other facilities); face-to-face time discussing  his blood glucose readings/logs, discussing hypoglycemia and hyperglycemia episodes and symptoms, medications doses, his options of short and long term treatment based on the latest standards of care / guidelines;  discussion about incorporating lifestyle medicine;  and documenting the encounter. Risk reduction counseling performed per USPSTF guidelines to reduce obesity and cardiovascular risk factors.     Please refer to Patient Instructions for Blood Glucose Monitoring and Insulin/Medications Dosing Guide"  in media tab for additional information. Please  also refer to " Patient Self Inventory" in the Media  tab for reviewed elements of pertinent patient history.  Patrick Taylor participated in the discussions, expressed understanding, and voiced agreement with the above plans.  All questions were answered to his satisfaction. he is encouraged to contact clinic should he have any questions or concerns prior to his return visit.   Follow up plan: Return in about 4  months (around 12/11/2023) for Diabetes F/U with A1c in office, No previsit labs, Bring meter and logs.  Ronny Bacon, [facility] [address] [place], [place] [address] Phone: [phone] Fax: [phone]  08/11/2023, 8:41 AM

## 2023-09-05 DIAGNOSIS — E1129 Type 2 diabetes mellitus with other diabetic kidney complication: Secondary | ICD-10-CM | POA: Diagnosis not present

## 2023-09-05 DIAGNOSIS — J449 Chronic obstructive pulmonary disease, unspecified: Secondary | ICD-10-CM | POA: Diagnosis not present

## 2023-09-05 DIAGNOSIS — Z6838 Body mass index (BMI) 38.0-38.9, adult: Secondary | ICD-10-CM | POA: Diagnosis not present

## 2023-09-05 DIAGNOSIS — T50905A Adverse effect of unspecified drugs, medicaments and biological substances, initial encounter: Secondary | ICD-10-CM | POA: Diagnosis not present

## 2023-09-05 DIAGNOSIS — E6609 Other obesity due to excess calories: Secondary | ICD-10-CM | POA: Diagnosis not present

## 2023-09-05 DIAGNOSIS — E1165 Type 2 diabetes mellitus with hyperglycemia: Secondary | ICD-10-CM | POA: Diagnosis not present

## 2023-09-05 DIAGNOSIS — I10 Essential (primary) hypertension: Secondary | ICD-10-CM | POA: Diagnosis not present

## 2023-09-05 DIAGNOSIS — E1140 Type 2 diabetes mellitus with diabetic neuropathy, unspecified: Secondary | ICD-10-CM | POA: Diagnosis not present

## 2023-09-06 ENCOUNTER — Telehealth: Payer: Self-pay | Admitting: *Deleted

## 2023-09-06 NOTE — Telephone Encounter (Signed)
Patient left a message that he needed his insulin, enough to last him until next week. Patient is using the Novolin 70 /30. At his office visit in August he was advised to increase his morning dose to 25 units, and keep the supper dose at the same at 20 units.  We do not have any samples. He has enough to get through the rest of this week.  He also ask if it could be fixed to where he could get two boxes at a time, 1 box is costing him over $30.  Patient was advised that I would share with [name] and see if she had any recommendations.

## 2023-09-07 MED ORDER — INSULIN ASPART PROT & ASPART (70-30 MIX) 100 UNIT/ML ~~LOC~~ SUSP: 40.0000 [IU] | Freq: Two times a day (BID) | SUBCUTANEOUS | 3 refills | Status: DC

## 2023-09-07 NOTE — Telephone Encounter (Signed)
I can send in a refill for him.  I will send it in for a bit more than what he is using so hopefully he can get a larger quantity at a time.  I sent it to [facility].

## 2023-09-07 NOTE — Addendum Note (Signed)
Addended by: Dani Gobble on: 09/07/2023 08:55 AM   Modules accepted: Orders

## 2023-09-07 NOTE — Telephone Encounter (Signed)
Patient was called and made aware. 

## 2023-09-08 ENCOUNTER — Telehealth: Payer: Self-pay | Admitting: *Deleted

## 2023-09-08 NOTE — Telephone Encounter (Signed)
Patient was called and made aware that we had 2 vials of the 70/30 insulin with syringes to help him out until next week. He refused as he is concerned about drawing the insulin up correctly. Even with [place] helping, he is concerned. I also gave to him a number for [employer], as they have a program for an immediate need. I also suggested [place] giving him an application for the assistance program to help with the insulin.

## 2023-09-22 ENCOUNTER — Other Ambulatory Visit: Payer: Self-pay | Admitting: Nurse Practitioner

## 2023-09-22 DIAGNOSIS — E1165 Type 2 diabetes mellitus with hyperglycemia: Secondary | ICD-10-CM

## 2023-10-25 DIAGNOSIS — Z794 Long term (current) use of insulin: Secondary | ICD-10-CM | POA: Diagnosis not present

## 2023-10-25 DIAGNOSIS — E1129 Type 2 diabetes mellitus with other diabetic kidney complication: Secondary | ICD-10-CM | POA: Diagnosis not present

## 2023-10-25 DIAGNOSIS — J449 Chronic obstructive pulmonary disease, unspecified: Secondary | ICD-10-CM | POA: Diagnosis not present

## 2023-10-25 DIAGNOSIS — J45909 Unspecified asthma, uncomplicated: Secondary | ICD-10-CM | POA: Diagnosis not present

## 2023-10-25 DIAGNOSIS — E6609 Other obesity due to excess calories: Secondary | ICD-10-CM | POA: Diagnosis not present

## 2023-10-25 DIAGNOSIS — Z6838 Body mass index (BMI) 38.0-38.9, adult: Secondary | ICD-10-CM | POA: Diagnosis not present

## 2023-10-25 DIAGNOSIS — E1165 Type 2 diabetes mellitus with hyperglycemia: Secondary | ICD-10-CM | POA: Diagnosis not present

## 2023-10-25 DIAGNOSIS — E1140 Type 2 diabetes mellitus with diabetic neuropathy, unspecified: Secondary | ICD-10-CM | POA: Diagnosis not present

## 2023-11-07 ENCOUNTER — Other Ambulatory Visit: Payer: Self-pay | Admitting: Nurse Practitioner

## 2023-11-24 DIAGNOSIS — J449 Chronic obstructive pulmonary disease, unspecified: Secondary | ICD-10-CM | POA: Diagnosis not present

## 2023-11-24 DIAGNOSIS — G894 Chronic pain syndrome: Secondary | ICD-10-CM | POA: Diagnosis not present

## 2023-11-24 DIAGNOSIS — E1140 Type 2 diabetes mellitus with diabetic neuropathy, unspecified: Secondary | ICD-10-CM | POA: Diagnosis not present

## 2023-11-24 DIAGNOSIS — I10 Essential (primary) hypertension: Secondary | ICD-10-CM | POA: Diagnosis not present

## 2023-11-24 DIAGNOSIS — E1129 Type 2 diabetes mellitus with other diabetic kidney complication: Secondary | ICD-10-CM | POA: Diagnosis not present

## 2023-11-24 DIAGNOSIS — E6609 Other obesity due to excess calories: Secondary | ICD-10-CM | POA: Diagnosis not present

## 2023-11-24 DIAGNOSIS — E1165 Type 2 diabetes mellitus with hyperglycemia: Secondary | ICD-10-CM | POA: Diagnosis not present

## 2023-11-24 DIAGNOSIS — Z6837 Body mass index (BMI) 37.0-37.9, adult: Secondary | ICD-10-CM | POA: Diagnosis not present

## 2023-12-12 ENCOUNTER — Encounter: Payer: Self-pay | Admitting: Nurse Practitioner

## 2023-12-12 ENCOUNTER — Ambulatory Visit (INDEPENDENT_AMBULATORY_CARE_PROVIDER_SITE_OTHER): Payer: Medicare HMO | Admitting: Nurse Practitioner

## 2023-12-12 VITALS — BP 122/68 | HR 68 | Ht 62.0 in | Wt 207.0 lb

## 2023-12-12 DIAGNOSIS — Z794 Long term (current) use of insulin: Secondary | ICD-10-CM | POA: Diagnosis not present

## 2023-12-12 DIAGNOSIS — Z7984 Long term (current) use of oral hypoglycemic drugs: Secondary | ICD-10-CM

## 2023-12-12 DIAGNOSIS — E1165 Type 2 diabetes mellitus with hyperglycemia: Secondary | ICD-10-CM | POA: Diagnosis not present

## 2023-12-12 DIAGNOSIS — I10 Essential (primary) hypertension: Secondary | ICD-10-CM

## 2023-12-12 DIAGNOSIS — E782 Mixed hyperlipidemia: Secondary | ICD-10-CM | POA: Diagnosis not present

## 2023-12-12 DIAGNOSIS — E559 Vitamin D deficiency, unspecified: Secondary | ICD-10-CM | POA: Diagnosis not present

## 2023-12-12 LAB — POCT GLYCOSYLATED HEMOGLOBIN (HGB A1C): HbA1c, POC (controlled diabetic range): 9.2 % — AB (ref 0.0–7.0)

## 2023-12-12 MED ORDER — METFORMIN HCL 500 MG PO TABS: 500.0000 mg | ORAL_TABLET | Freq: Two times a day (BID) | ORAL | 3 refills | Status: DC

## 2023-12-12 MED ORDER — INSULIN ASPART PROT & ASPART (70-30 MIX) 100 UNIT/ML ~~LOC~~ SUSP: 20.0000 [IU] | Freq: Two times a day (BID) | SUBCUTANEOUS | 3 refills | Status: DC

## 2023-12-12 MED ORDER — ACCU-CHEK GUIDE TEST VI STRP: ORAL_STRIP | 3 refills | Status: DC

## 2023-12-12 MED ORDER — BD PEN NEEDLE MINI U/F 31G X 5 MM MISC: 3 refills | Status: DC

## 2023-12-12 NOTE — Progress Notes (Signed)
12/12/2023   Endocrinology follow-up note   Subjective:    Patient ID: Patrick Taylor, male    DOB: 1950-12-20,    Past Medical History:  Diagnosis Date   Anemia    Asthma    Carpal tunnel syndrome    Chronic back pain    Colon polyps    COPD (chronic obstructive pulmonary disease) (HCC)    DM (diabetes mellitus) (HCC)    HTN (hypertension)    Hyperlipidemia    Past Surgical History:  Procedure Laterality Date   ABDOMINAL HERNIA REPAIR     BIOPSY  05/19/2023   Procedure: BIOPSY;  Surgeon: Corbin Ade, MD;  [facility]: [facility];  Service: Endoscopy;;   CATARACT EXTRACTION W/PHACO Right 10/02/2021   Procedure: CATARACT EXTRACTION PHACO AND INTRAOCULAR LENS PLACEMENT (IOC);  Surgeon: Fabio Pierce, MD;  Location: [facility];  Service: Ophthalmology;  Laterality: Right;  CDE: 4.57    CATARACT EXTRACTION W/PHACO Left 10/29/2021   Procedure: CATARACT EXTRACTION PHACO AND INTRAOCULAR LENS PLACEMENT LEFT EYE;  Surgeon: Fabio Pierce, MD;  Location: [facility];  Service: Ophthalmology;  Laterality: Left;  CDE 8.51   COLONOSCOPY  01/24/09   rectal polyp/adenomatous poylps/tubulovillous   COLONOSCOPY  03/02/2012   Procedure: COLONOSCOPY;  Surgeon: Corbin Ade, MD;  [facility]: [facility];  Service: Endoscopy;  Laterality: N/A;  11:00   COLONOSCOPY WITH PROPOFOL N/A 09/10/2021   Procedure: COLONOSCOPY WITH PROPOFOL;  Surgeon: Corbin Ade, MD;  [facility]: [facility];  Service: Endoscopy;  Laterality: N/A;  8:45am   ESOPHAGOGASTRODUODENOSCOPY (EGD) WITH PROPOFOL N/A 05/19/2023   Procedure: ESOPHAGOGASTRODUODENOSCOPY (EGD) WITH PROPOFOL;  Surgeon: Corbin Ade, MD;  [facility];  Service: Endoscopy;  Laterality: N/A;  915am, asa 3   POLYPECTOMY  09/10/2021   Procedure: POLYPECTOMY;  Surgeon: Corbin Ade, MD;  [facility]: [facility];  Service: Endoscopy;;   Social History   Socioeconomic History   Marital status: Married    Spouse name: Not on file   Number  of children: Not on file   Years of education: Not on file   Highest education level: Not on file  Occupational History   Occupation: works at [employer]  Tobacco Use   Smoking status: Former    Current packs/day: 0.00    Average packs/day: 1 pack/day for 20.0 years (20.0 ttl pk-yrs)    Types: Cigarettes    Start date: 02/18/1979    Quit date: 02/18/1999    Years since quitting: 24.8   Smokeless tobacco: Never  Vaping Use   Vaping status: Never Used  Substance and Sexual Activity   Alcohol use: No    Comment: no heavy use in past   Drug use: No   Sexual activity: Not Currently  Other Topics Concern   Not on file  Social History Narrative   Not on file   Social Drivers of Health   Financial Resource Strain: Not on file  Food Insecurity: Not on file  Transportation Needs: Not on file  Physical Activity: Not on file  Stress: Not on file  Social Connections: Not on file   Outpatient Encounter Medications as of 12/12/2023  Medication Sig   methylPREDNISolone (MEDROL DOSEPAK) 4 MG TBPK tablet Take by mouth.   albuterol (PROVENTIL) 2 MG tablet Take 2 mg by mouth 3 (three) times daily.   albuterol (VENTOLIN HFA) 108 (90 Base) MCG/ACT inhaler Inhale 1-2 puffs into the lungs every 6 (six) hours as needed.   aspirin 81  MG tablet Take 81 mg by mouth once a week.   blood glucose meter kit and supplies KIT Dispense based on patient and insurance preference. Use 3 times daily as directed (before breakfast, before supper, before bed). Dx e11.9   Blood Glucose Monitoring Suppl (ONE TOUCH ULTRA 2) w/Device KIT Use to test blood glucose 3 times daily. E11.65   Budeson-Glycopyrrol-Formoterol (BREZTRI AEROSPHERE) 160-9-4.8 MCG/ACT AERO Inhale into the lungs.   celecoxib (CELEBREX) 200 MG capsule Take 200 mg by mouth 2 (two) times daily as needed.   Continuous Glucose Sensor (FREESTYLE LIBRE 3 SENSOR) MISC Change sensor every 14 days   enalapril (VASOTEC) 20 MG tablet Take 1 tablet (20 mg total)  by mouth daily.   furosemide (LASIX) 20 MG tablet Take 20 mg by mouth daily.    gabapentin (NEURONTIN) 300 MG capsule Take 300 mg by mouth 3 (three) times daily. 5 CAPS daily   glucose blood (ACCU-CHEK GUIDE TEST) test strip Use as instructed to monitor glucose 3 times daily   glucose blood (ONETOUCH ULTRA) test strip USE AS INSTRUCTED TO TEST BLOOD GLUCOSE Three TIMES DAILY E11.65   HYDROcodone-acetaminophen (NORCO) 10-325 MG tablet Take 1 tablet by mouth 4 (four) times daily as needed.   insulin aspart protamine- aspart (NOVOLOG MIX 70/30) (70-30) 100 UNIT/ML injection Inject 0.2-0.25 mLs (20-25 Units total) into the skin 2 (two) times daily with a meal. Inject 25 units each morning and 20 units at supper   Insulin Pen Needle (B-D UF III MINI PEN NEEDLES) 31G X 5 MM MISC USE 1 PEN NEEDLE AS DIRECTED TWICE DAILY   Lancets (ONETOUCH ULTRASOFT) lancets Use as instructed to test blood glucose 4 times daily.   meclizine (ANTIVERT) 25 MG tablet Take 25 mg by mouth 3 (three) times daily as needed.   metFORMIN (GLUCOPHAGE) 500 MG tablet Take 1 tablet (500 mg total) by mouth 2 (two) times daily with a meal.   pravastatin (PRAVACHOL) 40 MG tablet Take 1 tablet by mouth once daily   [DISCONTINUED] ACCU-CHEK GUIDE TEST test strip USE 1 STRIP TO CHECK GLUCOSE THREE TIMES DAILY AS DIRECTED   [DISCONTINUED] insulin aspart protamine- aspart (NOVOLOG MIX 70/30) (70-30) 100 UNIT/ML injection Inject 0.4 mLs (40 Units total) into the skin 2 (two) times daily with a meal. (Patient taking differently: Inject 20-25 Units into the skin 2 (two) times daily with a meal. Inject 25 units each morning and 20 units at supper)   [DISCONTINUED] Insulin Pen Needle (B-D UF III MINI PEN NEEDLES) 31G X 5 MM MISC USE 1 PEN NEEDLE AS DIRECTED TWICE DAILY   [DISCONTINUED] metFORMIN (GLUCOPHAGE) 500 MG tablet Take 1 tablet (500 mg total) by mouth 2 (two) times daily with a meal.   No facility-administered encounter medications on file  as of 12/12/2023.   ALLERGIES: No Known Allergies VACCINATION STATUS: Immunization History  Administered Date(s) Administered   Moderna Sars-Covid-2 Vaccination 02/29/2020, 04/02/2020    Diabetes He presents for his follow-up diabetic visit. He has type 2 diabetes mellitus. Onset time: He was diagnosed at approximate age of 41 years. His disease course has been fluctuating. There are no hypoglycemic associated symptoms. Pertinent negatives for hypoglycemia include no confusion, headaches, pallor or seizures. Pertinent negatives for diabetes include no chest pain, no fatigue, no polydipsia, no polyphagia, no polyuria and no weakness. There are no hypoglycemic complications. Symptoms are stable. Diabetic complications include nephropathy. Risk factors for coronary artery disease include dyslipidemia, diabetes mellitus, hypertension, sedentary lifestyle, male sex, obesity, tobacco exposure  and family history. Current diabetic treatment includes insulin injections and oral agent (monotherapy). He is compliant with treatment most of the time. His weight is decreasing steadily. He is following a generally healthy diet. When asked about meal planning, he reported none. He has not had a previous visit with a dietitian. He rarely participates in exercise. His home blood glucose trend is fluctuating minimally. His breakfast blood glucose range is generally 140-180 mg/dl. His lunch blood glucose range is generally 140-180 mg/dl. His dinner blood glucose range is generally 140-180 mg/dl. His overall blood glucose range is 140-180 mg/dl. (He presents today, accompanied by his wife, with his logs showing slightly above target glycemic profile overall.  His POCT A1c today is 9.2 %, worsening from last visit of 8.5%.  Analysis of his meter shows 7-day average of 165, 14-day average of 174, 30-day average of 164, 90-day average of 164.  His glucose has ranged between 86-321.  He denies any significant hypoglycemia.  He  has been on/off steroid tapers for asthma flares which negatively impacts his glucose readings.) An ACE inhibitor/angiotensin II receptor blocker is being taken. He does not see a podiatrist.Eye exam is current.  Hyperlipidemia This is a chronic problem. The current episode started more than 1 year ago. The problem is uncontrolled. Recent lipid tests were reviewed and are high. Exacerbating diseases include chronic renal disease, diabetes and obesity. There are no known factors aggravating his hyperlipidemia. Pertinent negatives include no chest pain, myalgias or shortness of breath. Current antihyperlipidemic treatment includes statins. The current treatment provides moderate improvement of lipids. Compliance problems include adherence to diet and adherence to exercise.  Risk factors for coronary artery disease include diabetes mellitus, male sex, a sedentary lifestyle, obesity, hypertension and dyslipidemia.  Hypertension This is a chronic problem. The current episode started more than 1 year ago. The problem has been gradually improving since onset. The problem is uncontrolled. Pertinent negatives include no chest pain, headaches, neck pain, palpitations or shortness of breath. There are no associated agents to hypertension. Risk factors for coronary artery disease include dyslipidemia, diabetes mellitus, obesity, male gender, sedentary lifestyle and smoking/tobacco exposure. Past treatments include ACE inhibitors and diuretics. The current treatment provides mild improvement. There are no compliance problems.  Identifiable causes of hypertension include chronic renal disease.    Review of systems  Constitutional: + decreasing body weight,  current Body mass index is 37.86 kg/m. , no fatigue, no subjective hyperthermia, no subjective hypothermia Eyes: no blurry vision, no xerophthalmia ENT: no sore throat, no nodules palpated in throat, no dysphagia/odynophagia, no hoarseness Cardiovascular: no chest  pain, no shortness of breath, no palpitations, no leg swelling Respiratory: no cough, no shortness of breath Gastrointestinal: no nausea/vomiting/diarrhea Musculoskeletal: no muscle/joint aches Skin: no rashes, no hyperemia Neurological: no tremors, no numbness, no tingling, no dizziness Psychiatric: no depression, no anxiety   Objective:    BP 122/68   Pulse 68   Ht 5\' 2"  (1.575 m)   Wt 207 lb (93.9 kg)   BMI 37.86 kg/m   Wt Readings from Last 3 Encounters:  12/12/23 207 lb (93.9 kg)  08/11/23 213 lb 6.4 oz (96.8 kg)  05/19/23 211 lb 10.3 oz (96 kg)    BP Readings from Last 3 Encounters:  12/12/23 122/68  08/11/23 (!) 165/68  05/19/23 (!) 112/52     Physical Exam- Limited  Constitutional:  Body mass index is 37.86 kg/m. , not in acute distress, normal state of mind Eyes:  EOMI, no exophthalmos Musculoskeletal:  no gross deformities, strength intact in all four extremities, no gross restriction of joint movements Skin:  no rashes, no hyperemia Neurological: no tremor with outstretched hands   Diabetic Foot Exam - Simple   No data filed     Results for orders placed or performed in visit on 12/12/23  HgB A1c   Collection Time: 12/12/23  8:33 AM  Result Value Ref Range   Hemoglobin A1C     HbA1c POC (<> result, manual entry)     HbA1c, POC (prediabetic range)     HbA1c, POC (controlled diabetic range) 9.2 (A) 0.0 - 7.0 %  Diabetic Labs (most recent): Lab Results  Component Value Date   HGBA1C 9.2 (A) 12/12/2023   HGBA1C 8.5 (A) 08/11/2023   HGBA1C 7.7 (A) 04/11/2023   MICROALBUR 10 mg/L 08/11/2023   MICROALBUR 10 06/17/2021   MICROALBUR <3.0 06/05/2020    Lipid Panel     Component Value Date/Time   CHOL 174 06/10/2021 0824   TRIG 182 (H) 06/10/2021 0824   HDL 41 06/10/2021 0824   CHOLHDL 4.2 06/10/2021 0824   LDLCALC 101 (H) 06/10/2021 0824      Latest Ref Rng & Units 05/18/2023    2:33 PM 10/18/2022    7:48 AM 02/15/2022    8:15 AM  CMP  Glucose  70 - 99 mg/dL 578  64  66   BUN 8 - 23 mg/dL 15  15  14    Creatinine 0.61 - 1.24 mg/dL 4.69  6.29  5.28   Sodium 135 - 145 mmol/L 137  145  143   Potassium 3.5 - 5.1 mmol/L 4.5  4.5  4.2   Chloride 98 - 111 mmol/L 99  104  103   CO2 22 - 32 mmol/L 28  25  27    Calcium 8.9 - 10.3 mg/dL 9.4  41.3  24.4   Total Protein 6.0 - 8.5 g/dL  7.1  6.9   Total Bilirubin 0.0 - 1.2 mg/dL  0.3  0.3   Alkaline Phos 44 - 121 IU/L  64  76   AST 0 - 40 IU/L  23  23   ALT 0 - 44 IU/L  7  12      Assessment & Plan:   1) Uncontrolled type 2 diabetes mellitus without complication, with long-term current use of insulin (HCC)  He presents today, accompanied by his wife, with his logs showing slightly above target glycemic profile overall.  His POCT A1c today is 9.2 %, worsening from last visit of 8.5%.  Analysis of his meter shows 7-day average of 165, 14-day average of 174, 30-day average of 164, 90-day average of 164.  His glucose has ranged between 86-321.  He denies any significant hypoglycemia.  He has been on/off steroid tapers for asthma flares which negatively impacts his glucose readings.   Glucose logs and insulin administration records pertaining to this visit,  to be scanned into patient's records.  Recent labs reviewed.  - Patient remains at a high risk for more acute and chronic complications of diabetes which include CAD, CVA, CKD, retinopathy, and neuropathy. These are all discussed in detail with the patient.  - Nutritional counseling repeated at each appointment due to patients tendency to fall back in to old habits.  - The patient admits there is a room for improvement in their diet and drink choices. -  Suggestion is made for the patient to avoid simple carbohydrates from their diet including Cakes, Sweet Desserts / Pastries,  Ice Cream, Soda (diet and regular), Sweet Tea, Candies, Chips, Cookies, Sweet Pastries, Store Bought Juices, Alcohol in Excess of 1-2 drinks a day, Artificial  Sweeteners, Coffee Creamer, and "Sugar-free" Products. This will help patient to have stable blood glucose profile and potentially avoid unintended weight gain.   - I encouraged the patient to switch to unprocessed or minimally processed complex starch and increased protein intake (animal or plant source), fruits, and vegetables.   - Patient is advised to stick to a routine mealtimes to eat 3 meals a day and avoid unnecessary snacks (to snack only to correct hypoglycemia).  - I have approached patient with the following individualized plan to manage diabetes and patient agrees.  -He has responded very well to premixed insulin using Novolin 70/30.   -He is advised to continue his 70/30 25 units at breakfast and 20 units at supper (if glucose is above 90 and he is eating).   He is advised to continue his Metformin 500 mg po twice daily after meals.    -He is encouraged to monitor glucose at least 3 times daily, before injecting insulin (at breakfast and supper) and before bed, and call the clinic if he has readings less than 70 or greater than 200 for 3 days out of the week.  He cannot afford the copay for CGM thus prefers to stay with traditional fingersticks.  - Patient specific target  for A1c; LDL, HDL, Triglycerides, and  Waist Circumference were discussed in detail.  2) BP/HTN:  His blood pressure is controlled to target.  He is advised to continue Enalapril 20 mg po daily and Lasix 20 mg po daily.      3) Lipids/HPL:  His most recent lipid panel from 02/03/23 shows controlled LDL at 84.  He is advised to continue his Pravastatin 40 mg po daily at bedtime.  Side effects and precautions discussed with patient.  He has annual physical coming up with PCP, will request copy of labs.  4)  Weight/Diet:  His Body mass index is 37.86 kg/m.--clearly complicating his diabetes care.  He is a candidate for modest weight loss.  CDE consult in progress, exercise, and carbohydrates information  provided.  5) Chronic Care/Health Maintenance: -Patient is on ACEI and Statin medications and encouraged to continue to follow up with Ophthalmology, Podiatrist at least yearly or according to recommendations, and advised to stay away from smoking. I have recommended yearly flu vaccine and pneumonia vaccination at least every 5 years; moderate intensity exercise for up to 150 minutes weekly; and  sleep for at least 7 hours a day.  6) Vitamin D deficiency -His recent vitamin D level was 66.2 on 10/18/22.  He has been taking OTC Vitamin D3 5000 units daily as maintenance dose.     I advised patient to maintain close follow up with his PCP for primary care needs.     I spent  31  minutes in the care of the patient today including review of labs from CMP, Lipids, Thyroid Function, Hematology (current and previous including abstractions from other facilities); face-to-face time discussing  his blood glucose readings/logs, discussing hypoglycemia and hyperglycemia episodes and symptoms, medications doses, his options of short and long term treatment based on the latest standards of care / guidelines;  discussion about incorporating lifestyle medicine;  and documenting the encounter. Risk reduction counseling performed per USPSTF guidelines to reduce obesity and cardiovascular risk factors.     Please refer to Patient Instructions for Blood Glucose Monitoring and Insulin/Medications  Dosing Guide"  in media tab for additional information. Please  also refer to " Patient Self Inventory" in the Media  tab for reviewed elements of pertinent patient history.  Patrick Taylor participated in the discussions, expressed understanding, and voiced agreement with the above plans.  All questions were answered to his satisfaction. he is encouraged to contact clinic should he have any questions or concerns prior to his return visit.   Follow up plan: Return in about 4 months (around 04/11/2024) for Diabetes F/U  with A1c in office, No previsit labs, Bring meter and logs.  Ronny Bacon, [facility] [address] [place], [place] [address] Phone: [phone] Fax: [phone]  12/12/2023, 8:43 AM

## 2023-12-22 ENCOUNTER — Telehealth: Payer: Self-pay | Admitting: *Deleted

## 2023-12-22 MED ORDER — INSULIN ASPART PROT & ASPART (70-30 MIX) 100 UNIT/ML ~~LOC~~ SUSP: 20.0000 [IU] | Freq: Two times a day (BID) | SUBCUTANEOUS | 3 refills | Status: DC

## 2023-12-22 NOTE — Telephone Encounter (Signed)
 So, when I sent it in, it was for 5 vials which is just over a 90 day supply.  I resent it but put a note to the pharmacy that they may break it up in 30-day supply if it is cheaper for him.

## 2023-12-22 NOTE — Telephone Encounter (Signed)
 Patient was called and made aware.

## 2023-12-22 NOTE — Telephone Encounter (Signed)
 Patient left a voicemail asking that [name] call him to discuss his insulin. That this was very important.

## 2023-12-22 NOTE — Telephone Encounter (Signed)
 Talked with the patient. He states that he went to [facility] to get his insulin ,(70/30) they told him that he would have to take 2 packages and it would be over $200. He states that he needs a new prescription for 1 pack that will have the 5 pens in it sent in to [facility]. He is currently taking 25 units in the morning and 20 units at night. HE shared if his morning glucose was less than 90, he held his morning dose.Yesterday his morninf glucose was 89, and this morning it was 87.

## 2024-02-03 DIAGNOSIS — J449 Chronic obstructive pulmonary disease, unspecified: Secondary | ICD-10-CM | POA: Diagnosis not present

## 2024-02-03 DIAGNOSIS — Z794 Long term (current) use of insulin: Secondary | ICD-10-CM | POA: Diagnosis not present

## 2024-02-03 DIAGNOSIS — M25561 Pain in right knee: Secondary | ICD-10-CM | POA: Diagnosis not present

## 2024-02-03 DIAGNOSIS — E6609 Other obesity due to excess calories: Secondary | ICD-10-CM | POA: Diagnosis not present

## 2024-02-03 DIAGNOSIS — J45909 Unspecified asthma, uncomplicated: Secondary | ICD-10-CM | POA: Diagnosis not present

## 2024-02-03 DIAGNOSIS — E1129 Type 2 diabetes mellitus with other diabetic kidney complication: Secondary | ICD-10-CM | POA: Diagnosis not present

## 2024-02-03 DIAGNOSIS — E559 Vitamin D deficiency, unspecified: Secondary | ICD-10-CM | POA: Diagnosis not present

## 2024-02-03 DIAGNOSIS — M792 Neuralgia and neuritis, unspecified: Secondary | ICD-10-CM | POA: Diagnosis not present

## 2024-02-03 DIAGNOSIS — E1140 Type 2 diabetes mellitus with diabetic neuropathy, unspecified: Secondary | ICD-10-CM | POA: Diagnosis not present

## 2024-02-03 DIAGNOSIS — E1165 Type 2 diabetes mellitus with hyperglycemia: Secondary | ICD-10-CM | POA: Diagnosis not present

## 2024-02-03 DIAGNOSIS — Z125 Encounter for screening for malignant neoplasm of prostate: Secondary | ICD-10-CM | POA: Diagnosis not present

## 2024-02-03 DIAGNOSIS — E782 Mixed hyperlipidemia: Secondary | ICD-10-CM | POA: Diagnosis not present

## 2024-02-03 DIAGNOSIS — E538 Deficiency of other specified B group vitamins: Secondary | ICD-10-CM | POA: Diagnosis not present

## 2024-02-03 DIAGNOSIS — Z0001 Encounter for general adult medical examination with abnormal findings: Secondary | ICD-10-CM | POA: Diagnosis not present

## 2024-02-03 LAB — HEMOGLOBIN A1C: Hemoglobin A1C: 7.9

## 2024-02-03 LAB — MICROALBUMIN / CREATININE URINE RATIO: Microalb Creat Ratio: <14

## 2024-02-03 LAB — COMPREHENSIVE METABOLIC PANEL WITH GFR: eGFR: 77

## 2024-02-03 LAB — LIPID PANEL
LDL Cholesterol: 83
Triglycerides: 68 (ref 40–160)

## 2024-02-10 ENCOUNTER — Ambulatory Visit (HOSPITAL_COMMUNITY)
Admission: RE | Admit: 2024-02-10 | Discharge: 2024-02-10 | Disposition: A | Discharge status: Home / Self Care | Payer: Medicare HMO | Source: Ambulatory Visit | Attending: Internal Medicine | Admitting: Internal Medicine

## 2024-02-10 ENCOUNTER — Other Ambulatory Visit (HOSPITAL_COMMUNITY): Payer: Self-pay | Admitting: Internal Medicine

## 2024-02-10 DIAGNOSIS — R52 Pain, unspecified: Secondary | ICD-10-CM | POA: Diagnosis not present

## 2024-02-10 DIAGNOSIS — M25561 Pain in right knee: Secondary | ICD-10-CM | POA: Diagnosis not present

## 2024-02-10 DIAGNOSIS — M1711 Unilateral primary osteoarthritis, right knee: Secondary | ICD-10-CM | POA: Diagnosis not present

## 2024-02-10 DIAGNOSIS — M7651 Patellar tendinitis, right knee: Secondary | ICD-10-CM | POA: Diagnosis not present

## 2024-02-20 ENCOUNTER — Telehealth: Payer: Self-pay | Admitting: *Deleted

## 2024-02-20 NOTE — Telephone Encounter (Signed)
 Yes, that would be either PCP or urgent care to evaluate him for his cold.

## 2024-02-20 NOTE — Telephone Encounter (Signed)
 Patient left a message asking if Patrick Taylor could call in some medication for a cold? I believe that this would be through the PCP  or the patient may go to a urgent care for a further evaluation, but will ask [name].

## 2024-02-20 NOTE — Telephone Encounter (Signed)
 Patient was called and made aware.

## 2024-02-22 DIAGNOSIS — I10 Essential (primary) hypertension: Secondary | ICD-10-CM | POA: Diagnosis not present

## 2024-02-22 DIAGNOSIS — E6609 Other obesity due to excess calories: Secondary | ICD-10-CM | POA: Diagnosis not present

## 2024-02-22 DIAGNOSIS — E1129 Type 2 diabetes mellitus with other diabetic kidney complication: Secondary | ICD-10-CM | POA: Diagnosis not present

## 2024-02-22 DIAGNOSIS — E1140 Type 2 diabetes mellitus with diabetic neuropathy, unspecified: Secondary | ICD-10-CM | POA: Diagnosis not present

## 2024-02-22 DIAGNOSIS — E1165 Type 2 diabetes mellitus with hyperglycemia: Secondary | ICD-10-CM | POA: Diagnosis not present

## 2024-02-22 DIAGNOSIS — J449 Chronic obstructive pulmonary disease, unspecified: Secondary | ICD-10-CM | POA: Diagnosis not present

## 2024-02-22 DIAGNOSIS — Z794 Long term (current) use of insulin: Secondary | ICD-10-CM | POA: Diagnosis not present

## 2024-02-22 DIAGNOSIS — Z6836 Body mass index (BMI) 36.0-36.9, adult: Secondary | ICD-10-CM | POA: Diagnosis not present

## 2024-02-22 DIAGNOSIS — G894 Chronic pain syndrome: Secondary | ICD-10-CM | POA: Diagnosis not present

## 2024-03-07 DIAGNOSIS — E1165 Type 2 diabetes mellitus with hyperglycemia: Secondary | ICD-10-CM | POA: Insufficient documentation

## 2024-03-12 ENCOUNTER — Ambulatory Visit (INDEPENDENT_AMBULATORY_CARE_PROVIDER_SITE_OTHER): Admitting: Orthopedic Surgery

## 2024-03-12 ENCOUNTER — Telehealth: Payer: Self-pay | Admitting: Orthopedic Surgery

## 2024-03-12 VITALS — BP 135/84 | HR 62 | Ht 62.0 in | Wt 201.0 lb

## 2024-03-12 DIAGNOSIS — E1140 Type 2 diabetes mellitus with diabetic neuropathy, unspecified: Secondary | ICD-10-CM | POA: Diagnosis not present

## 2024-03-12 DIAGNOSIS — Z794 Long term (current) use of insulin: Secondary | ICD-10-CM | POA: Diagnosis not present

## 2024-03-12 DIAGNOSIS — M1711 Unilateral primary osteoarthritis, right knee: Secondary | ICD-10-CM

## 2024-03-12 DIAGNOSIS — J449 Chronic obstructive pulmonary disease, unspecified: Secondary | ICD-10-CM | POA: Diagnosis not present

## 2024-03-12 DIAGNOSIS — E782 Mixed hyperlipidemia: Secondary | ICD-10-CM | POA: Diagnosis not present

## 2024-03-12 DIAGNOSIS — J45909 Unspecified asthma, uncomplicated: Secondary | ICD-10-CM | POA: Diagnosis not present

## 2024-03-12 MED ORDER — METHYLPREDNISOLONE ACETATE 40 MG/ML IJ SUSP
40.0000 mg | Freq: Once | INTRAMUSCULAR | Status: AC
  Administered 2024-03-12: 40 mg via INTRA_ARTICULAR

## 2024-03-12 NOTE — Progress Notes (Signed)
 Right knee pain  Intake history:  BP 135/84   Pulse 62   Ht 5\' 2"  (1.575 m)   Wt 201 lb (91.2 kg)   BMI 36.76 kg/m  Body mass index is 36.76 kg/m.    WHAT ARE WE SEEING YOU FOR TODAY?   Right knee(s)  How long has this bothered you? (DOI?DOS?WS?)  1 year(s) ago  Anticoag.  Yes  Diabetes Yes  Heart disease No  Hypertension Yes  SMOKING HX No  Kidney disease No  Any ALLERGIES ______________________________________________   Treatment:  Have you taken:  Tylenol No  Advil No  Had PT No  Had injection No  Other  _________________________

## 2024-03-12 NOTE — Progress Notes (Signed)
 Patient ID: Patrick Taylor, male   DOB: [birth year], 73 y.o.   MRN: [id number]   Chief Complaint  Patient presents with   Knee Pain   Right knee pain since the beginning of the winter    BP 135/84   Pulse 62   Ht 5\' 2"  (1.575 m)   Wt 201 lb (91.2 kg)   BMI 36.38 kg/m    73 year old male with chronic back pain takes chronic opioid therapy hydrocodone 10 mg every 4 hours.  He has diabetes.  Hypertension.  Hyperlipidemia.  Vitamin D deficiency.  Obesity but has lost about 30 pounds  He presents with right knee pain  He describes his pain as a dull ache over the medial joint line.  Has no real mechanical symptoms.  Review of systems no chest pain or shortness of breath.  History of chronic back pain.  Physical Exam Vitals and nursing note reviewed.  Constitutional:      Appearance: Normal appearance.  HENT:     Head: Normocephalic and atraumatic.  Eyes:     General: No scleral icterus.       Right eye: No discharge.        Left eye: No discharge.     Extraocular Movements: Extraocular movements intact.     Conjunctiva/sclera: Conjunctivae normal.     Pupils: Pupils are equal, round, and reactive to light.  Cardiovascular:     Rate and Rhythm: Normal rate.     Pulses: Normal pulses.  Musculoskeletal:     Comments: Right knee has a flexion contracture.  It is less than 10 degrees.  He has a varus alignment to the right.  He has medial joint line tenderness.  He has crepitance.  His muscle strength in the quadriceps is normal.  Stability tests were normal.  Skin was warm dry intact without erythema.  Skin:    General: Skin is warm and dry.     Capillary Refill: Capillary refill takes less than 2 seconds.  Neurological:     General: No focal deficit present.     Mental Status: He is alert and oriented to person, place, and time.  Psychiatric:        Mood and Affect: Mood normal.        Behavior: Behavior normal.        Thought Content: Thought content normal.         Judgment: Judgment normal.     Encounter Diagnosis  Name Primary?   Primary osteoarthritis of right knee Yes    Assessment and plan  Imaging studies show varus osteoarthritis grade 4.  He has classic subchondral bone changes sclerosis osteophyte formation.  Severe joint space narrowing medially  73 year old male with diabetes slightly overweight still despite his good weight loss.  Recommend injection but I think he is going to come to total knee replacement as long as his hemoglobin A1c is good and his weight stays good.  We can also check a fructosamine to evaluate his glucose management  Return as needed  Procedure note right knee injection   verbal consent was obtained to inject right knee joint  Timeout was completed to confirm the site of injection  The medications used were depomedrol 40 mg and 1% lidocaine 3 cc Anesthesia was provided by ethyl chloride and the skin was prepped with alcohol.  After cleaning the skin with alcohol a 20-gauge needle was used to inject the right knee joint. There were no complications. A sterile bandage was applied.

## 2024-03-12 NOTE — Telephone Encounter (Signed)
 Dr. Mort Sawyers pt - at checkout the pt was asking for pain medication "just incase".  He would like it sent to [facility].

## 2024-03-13 DIAGNOSIS — J45909 Unspecified asthma, uncomplicated: Secondary | ICD-10-CM | POA: Diagnosis not present

## 2024-03-13 DIAGNOSIS — J449 Chronic obstructive pulmonary disease, unspecified: Secondary | ICD-10-CM | POA: Diagnosis not present

## 2024-03-13 DIAGNOSIS — E1129 Type 2 diabetes mellitus with other diabetic kidney complication: Secondary | ICD-10-CM | POA: Diagnosis not present

## 2024-03-13 DIAGNOSIS — D649 Anemia, unspecified: Secondary | ICD-10-CM | POA: Diagnosis not present

## 2024-03-13 DIAGNOSIS — K922 Gastrointestinal hemorrhage, unspecified: Secondary | ICD-10-CM | POA: Diagnosis not present

## 2024-03-13 DIAGNOSIS — Z794 Long term (current) use of insulin: Secondary | ICD-10-CM | POA: Diagnosis not present

## 2024-03-13 DIAGNOSIS — E1165 Type 2 diabetes mellitus with hyperglycemia: Secondary | ICD-10-CM | POA: Diagnosis not present

## 2024-03-13 DIAGNOSIS — T50905A Adverse effect of unspecified drugs, medicaments and biological substances, initial encounter: Secondary | ICD-10-CM | POA: Diagnosis not present

## 2024-03-13 DIAGNOSIS — M25561 Pain in right knee: Secondary | ICD-10-CM | POA: Diagnosis not present

## 2024-03-20 ENCOUNTER — Other Ambulatory Visit: Payer: Self-pay | Admitting: *Deleted

## 2024-03-20 DIAGNOSIS — Z794 Long term (current) use of insulin: Secondary | ICD-10-CM

## 2024-03-20 DIAGNOSIS — Z7984 Long term (current) use of oral hypoglycemic drugs: Secondary | ICD-10-CM

## 2024-03-20 DIAGNOSIS — E1165 Type 2 diabetes mellitus with hyperglycemia: Secondary | ICD-10-CM

## 2024-03-20 MED ORDER — METFORMIN HCL 500 MG PO TABS: 500.0000 mg | ORAL_TABLET | Freq: Two times a day (BID) | ORAL | 3 refills | Status: DC

## 2024-04-11 ENCOUNTER — Ambulatory Visit: Payer: Medicare HMO | Admitting: Nurse Practitioner

## 2024-04-11 DIAGNOSIS — E1140 Type 2 diabetes mellitus with diabetic neuropathy, unspecified: Secondary | ICD-10-CM | POA: Diagnosis not present

## 2024-04-11 DIAGNOSIS — G894 Chronic pain syndrome: Secondary | ICD-10-CM | POA: Diagnosis not present

## 2024-04-11 DIAGNOSIS — E1165 Type 2 diabetes mellitus with hyperglycemia: Secondary | ICD-10-CM | POA: Diagnosis not present

## 2024-04-11 DIAGNOSIS — J449 Chronic obstructive pulmonary disease, unspecified: Secondary | ICD-10-CM | POA: Diagnosis not present

## 2024-04-11 DIAGNOSIS — Z6837 Body mass index (BMI) 37.0-37.9, adult: Secondary | ICD-10-CM | POA: Diagnosis not present

## 2024-04-11 DIAGNOSIS — I10 Essential (primary) hypertension: Secondary | ICD-10-CM | POA: Diagnosis not present

## 2024-04-11 DIAGNOSIS — E1129 Type 2 diabetes mellitus with other diabetic kidney complication: Secondary | ICD-10-CM | POA: Diagnosis not present

## 2024-05-11 DIAGNOSIS — M1991 Primary osteoarthritis, unspecified site: Secondary | ICD-10-CM | POA: Diagnosis not present

## 2024-05-11 DIAGNOSIS — G894 Chronic pain syndrome: Secondary | ICD-10-CM | POA: Diagnosis not present

## 2024-05-11 DIAGNOSIS — E1140 Type 2 diabetes mellitus with diabetic neuropathy, unspecified: Secondary | ICD-10-CM | POA: Diagnosis not present

## 2024-05-11 DIAGNOSIS — Z794 Long term (current) use of insulin: Secondary | ICD-10-CM | POA: Diagnosis not present

## 2024-05-11 DIAGNOSIS — E1165 Type 2 diabetes mellitus with hyperglycemia: Secondary | ICD-10-CM | POA: Diagnosis not present

## 2024-05-11 DIAGNOSIS — E1129 Type 2 diabetes mellitus with other diabetic kidney complication: Secondary | ICD-10-CM | POA: Diagnosis not present

## 2024-05-24 DIAGNOSIS — E1165 Type 2 diabetes mellitus with hyperglycemia: Secondary | ICD-10-CM | POA: Diagnosis not present

## 2024-05-24 DIAGNOSIS — E1140 Type 2 diabetes mellitus with diabetic neuropathy, unspecified: Secondary | ICD-10-CM | POA: Diagnosis not present

## 2024-05-24 DIAGNOSIS — Z794 Long term (current) use of insulin: Secondary | ICD-10-CM | POA: Diagnosis not present

## 2024-05-24 DIAGNOSIS — G894 Chronic pain syndrome: Secondary | ICD-10-CM | POA: Diagnosis not present

## 2024-05-24 DIAGNOSIS — J449 Chronic obstructive pulmonary disease, unspecified: Secondary | ICD-10-CM | POA: Diagnosis not present

## 2024-05-24 DIAGNOSIS — H81312 Aural vertigo, left ear: Secondary | ICD-10-CM | POA: Diagnosis not present

## 2024-05-24 DIAGNOSIS — E1129 Type 2 diabetes mellitus with other diabetic kidney complication: Secondary | ICD-10-CM | POA: Diagnosis not present

## 2024-05-24 DIAGNOSIS — M25561 Pain in right knee: Secondary | ICD-10-CM | POA: Diagnosis not present

## 2024-05-24 DIAGNOSIS — I10 Essential (primary) hypertension: Secondary | ICD-10-CM | POA: Diagnosis not present

## 2024-06-04 ENCOUNTER — Other Ambulatory Visit (HOSPITAL_COMMUNITY): Payer: Self-pay

## 2024-06-04 ENCOUNTER — Telehealth: Payer: Self-pay

## 2024-06-04 ENCOUNTER — Telehealth: Payer: Self-pay | Admitting: Nurse Practitioner

## 2024-06-04 ENCOUNTER — Telehealth: Payer: Self-pay | Admitting: *Deleted

## 2024-06-04 ENCOUNTER — Other Ambulatory Visit: Payer: Self-pay

## 2024-06-04 DIAGNOSIS — E1165 Type 2 diabetes mellitus with hyperglycemia: Secondary | ICD-10-CM

## 2024-06-04 MED ORDER — HUMALOG MIX 75/25 (75-25) 100 UNIT/ML ~~LOC~~ SUSP
SUBCUTANEOUS | 2 refills | Status: DC
Start: 2024-06-04 — End: 2024-07-11

## 2024-06-04 NOTE — Telephone Encounter (Signed)
 He can switch to the 75/25, thus we won't need to complete the PA.

## 2024-06-04 NOTE — Telephone Encounter (Signed)
 Pt is needing a PA on Novolog  70/30.  States he will be out tomorrow

## 2024-06-04 NOTE — Telephone Encounter (Signed)
 Hoy from [place] called to confirm patient's recent prescription that was sent in. Made sure that we were changing it Humalog  75/25, this was confirmed. Then , it was ask if it was to be a vial verses a flex pen. Talked with [place], if insurance will cover the flex pen would be better for the patient , if the insurance will cover for it. Call returned to [place], the above information was shared. She is going to run it and see if insurance will accept the flex pen.

## 2024-06-04 NOTE — Telephone Encounter (Signed)
 Pharmacy Patient Advocate Encounter   Received notification from Pt Calls Messages that prior authorization for Novolog  70/30 is required/requested.   Insurance verification completed.   The patient is insured through [facility] .   Per test claim:  Humalog  75/25 is preferred by the insurance.  If suggested medication is appropriate, Please send in a new RX and discontinue this one. If not, please advise as to why it's not appropriate so that we may request a Prior Authorization. Please note, some preferred medications may still require a PA.  If the suggested medications have not been trialed and there are no contraindications to their use, the PA will not be submitted, as it will not be approved.   I see this was sent in today and has a copay of $25. Please advise

## 2024-06-04 NOTE — Telephone Encounter (Signed)
 Addressed in new encounter.

## 2024-06-11 ENCOUNTER — Ambulatory Visit: Admitting: Orthopedic Surgery

## 2024-06-11 DIAGNOSIS — M792 Neuralgia and neuritis, unspecified: Secondary | ICD-10-CM | POA: Diagnosis not present

## 2024-06-11 DIAGNOSIS — G894 Chronic pain syndrome: Secondary | ICD-10-CM | POA: Diagnosis not present

## 2024-06-11 DIAGNOSIS — M1991 Primary osteoarthritis, unspecified site: Secondary | ICD-10-CM | POA: Diagnosis not present

## 2024-06-11 DIAGNOSIS — J449 Chronic obstructive pulmonary disease, unspecified: Secondary | ICD-10-CM | POA: Diagnosis not present

## 2024-06-11 DIAGNOSIS — E1165 Type 2 diabetes mellitus with hyperglycemia: Secondary | ICD-10-CM | POA: Diagnosis not present

## 2024-06-11 DIAGNOSIS — Z794 Long term (current) use of insulin: Secondary | ICD-10-CM | POA: Diagnosis not present

## 2024-06-11 DIAGNOSIS — E1140 Type 2 diabetes mellitus with diabetic neuropathy, unspecified: Secondary | ICD-10-CM | POA: Diagnosis not present

## 2024-06-11 DIAGNOSIS — E1129 Type 2 diabetes mellitus with other diabetic kidney complication: Secondary | ICD-10-CM | POA: Diagnosis not present

## 2024-06-11 DIAGNOSIS — I10 Essential (primary) hypertension: Secondary | ICD-10-CM | POA: Diagnosis not present

## 2024-06-27 ENCOUNTER — Other Ambulatory Visit: Payer: Self-pay | Admitting: Nurse Practitioner

## 2024-06-27 DIAGNOSIS — E1165 Type 2 diabetes mellitus with hyperglycemia: Secondary | ICD-10-CM

## 2024-07-09 ENCOUNTER — Telehealth: Payer: Self-pay | Admitting: Nurse Practitioner

## 2024-07-09 DIAGNOSIS — E1165 Type 2 diabetes mellitus with hyperglycemia: Secondary | ICD-10-CM

## 2024-07-09 MED ORDER — ACCU-CHEK SOFTCLIX LANCETS MISC: 2 refills | Status: AC

## 2024-07-09 MED ORDER — ACCU-CHEK GUIDE TEST VI STRP: ORAL_STRIP | 2 refills | Status: AC

## 2024-07-09 MED ORDER — ACCU-CHEK GUIDE ME W/DEVICE KIT: PACK | 0 refills | Status: AC

## 2024-07-09 NOTE — Telephone Encounter (Signed)
 Pt needs new meter, strips, lancets sent to [facility] in [place].  Insurance stated this is the meter they will pay for [employer]

## 2024-07-09 NOTE — Telephone Encounter (Signed)
 Rx refills for Accu-chek Guide meter and supplies sent to [facility].

## 2024-07-10 ENCOUNTER — Ambulatory Visit: Admitting: Nurse Practitioner

## 2024-07-10 DIAGNOSIS — Z7984 Long term (current) use of oral hypoglycemic drugs: Secondary | ICD-10-CM

## 2024-07-10 DIAGNOSIS — E1165 Type 2 diabetes mellitus with hyperglycemia: Secondary | ICD-10-CM

## 2024-07-10 DIAGNOSIS — E559 Vitamin D deficiency, unspecified: Secondary | ICD-10-CM

## 2024-07-10 DIAGNOSIS — I10 Essential (primary) hypertension: Secondary | ICD-10-CM

## 2024-07-10 DIAGNOSIS — Z794 Long term (current) use of insulin: Secondary | ICD-10-CM

## 2024-07-10 DIAGNOSIS — E782 Mixed hyperlipidemia: Secondary | ICD-10-CM

## 2024-07-11 ENCOUNTER — Encounter: Payer: Self-pay | Admitting: Nurse Practitioner

## 2024-07-11 ENCOUNTER — Ambulatory Visit (INDEPENDENT_AMBULATORY_CARE_PROVIDER_SITE_OTHER): Admitting: Nurse Practitioner

## 2024-07-11 VITALS — BP 122/80 | HR 62 | Ht 62.0 in | Wt 207.8 lb

## 2024-07-11 DIAGNOSIS — E1165 Type 2 diabetes mellitus with hyperglycemia: Secondary | ICD-10-CM

## 2024-07-11 DIAGNOSIS — Z7984 Long term (current) use of oral hypoglycemic drugs: Secondary | ICD-10-CM | POA: Diagnosis not present

## 2024-07-11 DIAGNOSIS — Z794 Long term (current) use of insulin: Secondary | ICD-10-CM

## 2024-07-11 LAB — POCT GLYCOSYLATED HEMOGLOBIN (HGB A1C): Hemoglobin A1C: 8.3 % — AB (ref 4.0–5.6)

## 2024-07-11 MED ORDER — METFORMIN HCL 500 MG PO TABS: 500.0000 mg | ORAL_TABLET | Freq: Two times a day (BID) | ORAL | 3 refills | Status: DC

## 2024-07-11 MED ORDER — HUMALOG MIX 75/25 (75-25) 100 UNIT/ML ~~LOC~~ SUSP: 25.0000 [IU] | Freq: Two times a day (BID) | SUBCUTANEOUS | 11 refills | Status: DC

## 2024-07-11 NOTE — Progress Notes (Signed)
 07/11/2024   Endocrinology follow-up note   Subjective:    Patient ID: Patrick Taylor, male    DOB: Nov 21, 1951,    Past Medical History:  Diagnosis Date   Anemia    Asthma    Carpal tunnel syndrome    Chronic back pain    Colon polyps    COPD (chronic obstructive pulmonary disease) (HCC)    DM (diabetes mellitus) (HCC)    HTN (hypertension)    Hyperlipidemia    Past Surgical History:  Procedure Laterality Date   ABDOMINAL HERNIA REPAIR     BIOPSY  05/19/2023   Procedure: BIOPSY;  Surgeon: Shaaron Lamar HERO, MD;  Location: [facility];  Service: Endoscopy;;   CATARACT EXTRACTION W/PHACO Right 10/02/2021   Procedure: CATARACT EXTRACTION PHACO AND INTRAOCULAR LENS PLACEMENT (IOC);  Surgeon: Harrie Agent, MD;  Location: [facility];  Service: Ophthalmology;  Laterality: Right;  CDE: 4.57    CATARACT EXTRACTION W/PHACO Left 10/29/2021   Procedure: CATARACT EXTRACTION PHACO AND INTRAOCULAR LENS PLACEMENT LEFT EYE;  Surgeon: Harrie Agent, MD;  Location: [facility];  Service: Ophthalmology;  Laterality: Left;  CDE 8.51   COLONOSCOPY  01/24/09   rectal polyp/adenomatous poylps/tubulovillous   COLONOSCOPY  03/02/2012   Procedure: COLONOSCOPY;  Surgeon: Lamar HERO Shaaron, MD;  Location: [facility];  Service: Endoscopy;  Laterality: N/A;  11:00   COLONOSCOPY WITH PROPOFOL  N/A 09/10/2021   Procedure: COLONOSCOPY WITH PROPOFOL ;  Surgeon: Shaaron Lamar HERO, MD;  Location: [facility];  Service: Endoscopy;  Laterality: N/A;  8:45am   ESOPHAGOGASTRODUODENOSCOPY (EGD) WITH PROPOFOL  N/A 05/19/2023   Procedure: ESOPHAGOGASTRODUODENOSCOPY (EGD) WITH PROPOFOL ;  Surgeon: Shaaron Lamar HERO, MD;  Location: [facility];  Service: Endoscopy;  Laterality: N/A;  915am, asa 3   POLYPECTOMY  09/10/2021   Procedure: POLYPECTOMY;  Surgeon: Shaaron Lamar HERO, MD;  Location: [facility];  Service: Endoscopy;;   Social History   Socioeconomic History   Marital status: Married    Spouse name: Not on file   Number  of children: Not on file   Years of education: Not on file   Highest education level: Not on file  Occupational History   Occupation: works at [employer]  Tobacco Use   Smoking status: Former    Current packs/day: 0.00    Average packs/day: 1 pack/day for 20.0 years (20.0 ttl pk-yrs)    Types: Cigarettes    Start date: 02/18/1979    Quit date: 02/18/1999    Years since quitting: 25.4   Smokeless tobacco: Never  Vaping Use   Vaping status: Never Used  Substance and Sexual Activity   Alcohol use: No    Comment: no heavy use in past   Drug use: No   Sexual activity: Not Currently  Other Topics Concern   Not on file  Social History Narrative   Not on file   Social Drivers of Health   Financial Resource Strain: Not on file  Food Insecurity: Not on file  Transportation Needs: Not on file  Physical Activity: Not on file  Stress: Not on file  Social Connections: Not on file   Outpatient Encounter Medications as of 07/11/2024  Medication Sig   Accu-Chek Softclix Lancets lancets Use to check blood glucose 3 times daily as instructed   albuterol (PROVENTIL) 2 MG tablet Take 2 mg by mouth 3 (three) times daily.   albuterol (VENTOLIN HFA) 108 (90 Base) MCG/ACT inhaler Inhale 1-2 puffs into the lungs every 6 (six) hours as needed.  aspirin 81 MG tablet Take 81 mg by mouth once a week.   BD PEN NEEDLE MINI ULTRAFINE 31G X 5 MM MISC USE 1 PEN NEEDLE WITH INSULIN  PEN TWICE DAILY AS DIRECTED   blood glucose meter kit and supplies KIT Dispense based on patient and insurance preference. Use 3 times daily as directed (before breakfast, before supper, before bed). Dx e11.9   Blood Glucose Monitoring Suppl (ACCU-CHEK GUIDE ME) w/Device KIT Use to check blood glucose three times daily as instructed   Budeson-Glycopyrrol-Formoterol (BREZTRI AEROSPHERE) 160-9-4.8 MCG/ACT AERO Inhale into the lungs.   celecoxib (CELEBREX) 200 MG capsule Take 200 mg by mouth 2 (two) times daily as needed.   enalapril   (VASOTEC ) 20 MG tablet Take 1 tablet (20 mg total) by mouth daily.   furosemide (LASIX) 20 MG tablet Take 20 mg by mouth daily.    gabapentin (NEURONTIN) 300 MG capsule Take 300 mg by mouth 3 (three) times daily. 5 CAPS daily   glucose blood (ACCU-CHEK GUIDE TEST) test strip Use to check blood glucose three times daily as instructed   HYDROcodone -acetaminophen  (NORCO) 10-325 MG tablet Take 1 tablet by mouth 4 (four) times daily as needed.   meclizine (ANTIVERT) 25 MG tablet Take 25 mg by mouth 3 (three) times daily as needed.   methylPREDNISolone  (MEDROL  DOSEPAK) 4 MG TBPK tablet Take by mouth.   pravastatin  (PRAVACHOL ) 40 MG tablet Take 1 tablet by mouth once daily   [DISCONTINUED] insulin  lispro protamine-lispro (HUMALOG  MIX 75/25) (75-25) 100 UNIT/ML SUSP injection INJECT 25 UNITS EACH MORNING WITH BREAKFAST AND 20 UNITS EACH EVENING AT SUPPER WHEN GLUCOSE IS ABOVE 90.   [DISCONTINUED] metFORMIN  (GLUCOPHAGE ) 500 MG tablet Take 1 tablet (500 mg total) by mouth 2 (two) times daily with a meal.   Continuous Glucose Sensor (FREESTYLE LIBRE 3 SENSOR) MISC Change sensor every 14 days (Patient not taking: Reported on 07/11/2024)   insulin  lispro protamine-lispro (HUMALOG  MIX 75/25) (75-25) 100 UNIT/ML SUSP injection Inject 0.25 mLs (25 Units total) into the skin 2 (two) times daily with a meal. INJECT 25 UNITS EACH MORNING WITH BREAKFAST AND 20 UNITS EACH EVENING AT SUPPER WHEN GLUCOSE IS ABOVE 90.   metFORMIN  (GLUCOPHAGE ) 500 MG tablet Take 1 tablet (500 mg total) by mouth 2 (two) times daily with a meal.   No facility-administered encounter medications on file as of 07/11/2024.   ALLERGIES: No Known Allergies VACCINATION STATUS: Immunization History  Administered Date(s) Administered   Moderna Sars-Covid-2 Vaccination 02/29/2020, 04/02/2020    Diabetes He presents for his follow-up diabetic visit. He has type 2 diabetes mellitus. Onset time: He was diagnosed at approximate age of 41 years. His  disease course has been fluctuating. There are no hypoglycemic associated symptoms. Pertinent negatives for hypoglycemia include no confusion, headaches, pallor or seizures. Pertinent negatives for diabetes include no chest pain, no fatigue, no polydipsia, no polyphagia, no polyuria and no weakness. There are no hypoglycemic complications. Symptoms are stable. Diabetic complications include nephropathy. Risk factors for coronary artery disease include dyslipidemia, diabetes mellitus, hypertension, sedentary lifestyle, male sex, obesity, tobacco exposure and family history. Current diabetic treatment includes insulin  injections and oral agent (monotherapy). He is compliant with treatment most of the time. His weight is decreasing steadily. He is following a generally healthy diet. When asked about meal planning, he reported none. He has not had a previous visit with a dietitian. He rarely participates in exercise. His home blood glucose trend is fluctuating minimally. His breakfast blood glucose range is generally 140-180  mg/dl. His lunch blood glucose range is generally 140-180 mg/dl. His dinner blood glucose range is generally 180-200 mg/dl. (He presents today, accompanied by his wife, with his logs showing slightly above target glycemic profile overall.  His POCT A1c today is 8.3 %, worsening from last visit of 7.9%.  He denies any hypoglycemia.  He notes he sometimes has a hard time affording his insulin .  He did have steroid shot between visits.) An ACE inhibitor/angiotensin II receptor blocker is being taken. He does not see a podiatrist.Eye exam is current.  Hyperlipidemia This is a chronic problem. The current episode started more than 1 year ago. The problem is uncontrolled. Recent lipid tests were reviewed and are high. Exacerbating diseases include chronic renal disease, diabetes and obesity. There are no known factors aggravating his hyperlipidemia. Pertinent negatives include no chest pain, myalgias or  shortness of breath. Current antihyperlipidemic treatment includes statins. The current treatment provides moderate improvement of lipids. Compliance problems include adherence to diet and adherence to exercise.  Risk factors for coronary artery disease include diabetes mellitus, male sex, a sedentary lifestyle, obesity, hypertension and dyslipidemia.  Hypertension This is a chronic problem. The current episode started more than 1 year ago. The problem has been gradually improving since onset. The problem is uncontrolled. Pertinent negatives include no chest pain, headaches, neck pain, palpitations or shortness of breath. There are no associated agents to hypertension. Risk factors for coronary artery disease include dyslipidemia, diabetes mellitus, obesity, male gender, sedentary lifestyle and smoking/tobacco exposure. Past treatments include ACE inhibitors and diuretics. The current treatment provides mild improvement. There are no compliance problems.  Identifiable causes of hypertension include chronic renal disease.    Review of systems  Constitutional: + stable body weight,  current Body mass index is 38.01 kg/m. , no fatigue, no subjective hyperthermia, no subjective hypothermia Eyes: no blurry vision, no xerophthalmia ENT: no sore throat, no nodules palpated in throat, no dysphagia/odynophagia, no hoarseness Cardiovascular: no chest pain, no shortness of breath, no palpitations, no leg swelling Respiratory: no cough, no shortness of breath Gastrointestinal: no nausea/vomiting/diarrhea Musculoskeletal: no muscle/joint aches Skin: no rashes, no hyperemia Neurological: no tremors, no numbness, no tingling, no dizziness Psychiatric: no depression, no anxiety   Objective:    BP 122/80 (BP Location: Left Arm, Patient Position: Sitting, Cuff Size: Large)   Pulse 62   Ht 5' 2 (1.575 m)   Wt 207 lb 12.8 oz (94.3 kg)   BMI 38.01 kg/m   Wt Readings from Last 3 Encounters:  07/11/24 207 lb  12.8 oz (94.3 kg)  03/12/24 201 lb (91.2 kg)  12/12/23 207 lb (93.9 kg)    BP Readings from Last 3 Encounters:  07/11/24 122/80  03/12/24 135/84  12/12/23 122/68     Physical Exam- Limited  Constitutional:  Body mass index is 38.01 kg/m. , not in acute distress, normal state of mind Eyes:  EOMI, no exophthalmos Musculoskeletal: no gross deformities, strength intact in all four extremities, no gross restriction of joint movements Skin:  no rashes, no hyperemia Neurological: no tremor with outstretched hands   Diabetic Foot Exam - Simple   No data filed     Results for orders placed or performed in visit on 07/11/24  Microalbumin / creatinine urine ratio   Collection Time: 02/03/24 12:00 AM  Result Value Ref Range   Microalb Creat Ratio <14   Comprehensive metabolic panel with GFR   Collection Time: 02/03/24 12:00 AM  Result Value Ref Range   eGFR  77   Lipid panel   Collection Time: 02/03/24 12:00 AM  Result Value Ref Range   Triglycerides 68 40 - 160   LDL Cholesterol 83   Hemoglobin A1c   Collection Time: 02/03/24 12:00 AM  Result Value Ref Range   Hemoglobin A1C 7.9   HgB A1c   Collection Time: 07/11/24  8:51 AM  Result Value Ref Range   Hemoglobin A1C 8.3 (A) 4.0 - 5.6 %   HbA1c POC (<> result, manual entry)     HbA1c, POC (prediabetic range)     HbA1c, POC (controlled diabetic range)    Diabetic Labs (most recent): Lab Results  Component Value Date   HGBA1C 8.3 (A) 07/11/2024   HGBA1C 7.9 02/03/2024   HGBA1C 9.2 (A) 12/12/2023   MICROALBUR 10 mg/L 08/11/2023   MICROALBUR 10 06/17/2021   MICROALBUR <3.0 06/05/2020    Lipid Panel     Component Value Date/Time   CHOL 174 06/10/2021 0824   TRIG 68 02/03/2024 0000   HDL 41 06/10/2021 0824   CHOLHDL 4.2 06/10/2021 0824   LDLCALC 83 02/03/2024 0000   LDLCALC 101 (H) 06/10/2021 0824      Latest Ref Rng & Units 05/18/2023    2:33 PM 10/18/2022    7:48 AM 02/15/2022    8:15 AM  CMP  Glucose 70 - 99  mg/dL 893  64  66   BUN 8 - 23 mg/dL 15  15  14    Creatinine 0.61 - 1.24 mg/dL 8.98  8.96  9.02   Sodium 135 - 145 mmol/L 137  145  143   Potassium 3.5 - 5.1 mmol/L 4.5  4.5  4.2   Chloride 98 - 111 mmol/L 99  104  103   CO2 22 - 32 mmol/L 28  25  27    Calcium 8.9 - 10.3 mg/dL 9.4  89.8  89.9   Total Protein 6.0 - 8.5 g/dL  7.1  6.9   Total Bilirubin 0.0 - 1.2 mg/dL  0.3  0.3   Alkaline Phos 44 - 121 IU/L  64  76   AST 0 - 40 IU/L  23  23   ALT 0 - 44 IU/L  7  12      Assessment & Plan:   1) Uncontrolled type 2 diabetes mellitus without complication, with long-term current use of insulin  (HCC)  He presents today, accompanied by his wife, with his logs showing slightly above target glycemic profile overall.  His POCT A1c today is 8.3 %, worsening from last visit of 7.9%.  He denies any hypoglycemia.  He notes he sometimes has a hard time affording his insulin .  He did have steroid shot between visits.  Glucose logs and insulin  administration records pertaining to this visit,  to be scanned into patient's records.  Recent labs reviewed.  - Patient remains at a high risk for more acute and chronic complications of diabetes which include CAD, CVA, CKD, retinopathy, and neuropathy. These are all discussed in detail with the patient.  - Nutritional counseling repeated at each appointment due to patients tendency to fall back in to old habits.  - The patient admits there is a room for improvement in their diet and drink choices. -  Suggestion is made for the patient to avoid simple carbohydrates from their diet including Cakes, Sweet Desserts / Pastries, Ice Cream, Soda (diet and regular), Sweet Tea, Candies, Chips, Cookies, Sweet Pastries, Store Bought Juices, Alcohol in Excess of 1-2 drinks a day, Artificial  Sweeteners, Coffee Creamer, and Sugar-free Products. This will help patient to have stable blood glucose profile and potentially avoid unintended weight gain.   - I encouraged the  patient to switch to unprocessed or minimally processed complex starch and increased protein intake (animal or plant source), fruits, and vegetables.   - Patient is advised to stick to a routine mealtimes to eat 3 meals a day and avoid unnecessary snacks (to snack only to correct hypoglycemia).  - I have approached patient with the following individualized plan to manage diabetes and patient agrees.  -He has responded very well to premixed insulin  using Novolin  70/30.   -He is advised to adjust his 75/25 (changed from 70/30 due to ins pref) to 25 units at breakfast and 25 units at supper (if glucose is above 90 and he is eating).   He is advised to continue his Metformin  500 mg po twice daily after meals.    -He is encouraged to monitor glucose at least 3 times daily, before injecting insulin  (at breakfast and supper) and before bed, and call the clinic if he has readings less than 70 or greater than 200 for 3 days out of the week.  He cannot afford the copay for CGM thus prefers to stay with traditional fingersticks.  - Patient specific target  for A1c; LDL, HDL, Triglycerides, and  Waist Circumference were discussed in detail.  2) BP/HTN:  His blood pressure is controlled to target.  He is advised to continue Enalapril  20 mg po daily and Lasix 20 mg po daily.      3) Lipids/HPL:  His most recent lipid panel from 02/03/24 shows controlled LDL at 83.  He is advised to continue his Pravastatin  40 mg po daily at bedtime.  Side effects and precautions discussed with patient.   4)  Weight/Diet:  His Body mass index is 38.01 kg/m.--clearly complicating his diabetes care.  He is a candidate for modest weight loss.  CDE consult in progress, exercise, and carbohydrates information provided.  5) Chronic Care/Health Maintenance: -Patient is on ACEI and Statin medications and encouraged to continue to follow up with Ophthalmology, Podiatrist at least yearly or according to recommendations, and advised  to stay away from smoking. I have recommended yearly flu vaccine and pneumonia vaccination at least every 5 years; moderate intensity exercise for up to 150 minutes weekly; and  sleep for at least 7 hours a day.  6) Vitamin D  deficiency -His recent vitamin D  level was 66.2 on 10/18/22.  He has been taking OTC Vitamin D3 5000 units daily as maintenance dose.     I advised patient to maintain close follow up with his PCP for primary care needs.     I spent  28  minutes in the care of the patient today including review of labs from CMP, Lipids, Thyroid  Function, Hematology (current and previous including abstractions from other facilities); face-to-face time discussing  his blood glucose readings/logs, discussing hypoglycemia and hyperglycemia episodes and symptoms, medications doses, his options of short and long term treatment based on the latest standards of care / guidelines;  discussion about incorporating lifestyle medicine;  and documenting the encounter. Risk reduction counseling performed per USPSTF guidelines to reduce obesity and cardiovascular risk factors.     Please refer to Patient Instructions for Blood Glucose Monitoring and Insulin /Medications Dosing Guide  in media tab for additional information. Please  also refer to  Patient Self Inventory in the Media  tab for reviewed elements of pertinent patient history.  Patrick Taylor participated in the discussions, expressed understanding, and voiced agreement with the above plans.  All questions were answered to his satisfaction. he is encouraged to contact clinic should he have any questions or concerns prior to his return visit.   Follow up plan: Return in about 4 months (around 11/11/2024) for Diabetes F/U with A1c in office, No previsit labs, Bring meter and logs.  Benton Rio, [facility] [address] [place], [place] [address] Phone: [phone] Fax: [phone]  07/11/2024,  9:02 AM

## 2024-07-13 DIAGNOSIS — E1165 Type 2 diabetes mellitus with hyperglycemia: Secondary | ICD-10-CM | POA: Diagnosis not present

## 2024-07-13 DIAGNOSIS — G894 Chronic pain syndrome: Secondary | ICD-10-CM | POA: Diagnosis not present

## 2024-07-13 DIAGNOSIS — M792 Neuralgia and neuritis, unspecified: Secondary | ICD-10-CM | POA: Diagnosis not present

## 2024-07-13 DIAGNOSIS — E1129 Type 2 diabetes mellitus with other diabetic kidney complication: Secondary | ICD-10-CM | POA: Diagnosis not present

## 2024-07-13 DIAGNOSIS — I10 Essential (primary) hypertension: Secondary | ICD-10-CM | POA: Diagnosis not present

## 2024-07-13 DIAGNOSIS — J449 Chronic obstructive pulmonary disease, unspecified: Secondary | ICD-10-CM | POA: Diagnosis not present

## 2024-07-13 DIAGNOSIS — M1991 Primary osteoarthritis, unspecified site: Secondary | ICD-10-CM | POA: Diagnosis not present

## 2024-07-13 DIAGNOSIS — E1140 Type 2 diabetes mellitus with diabetic neuropathy, unspecified: Secondary | ICD-10-CM | POA: Diagnosis not present

## 2024-08-15 ENCOUNTER — Other Ambulatory Visit: Payer: Self-pay | Admitting: Nurse Practitioner

## 2024-08-15 DIAGNOSIS — E1165 Type 2 diabetes mellitus with hyperglycemia: Secondary | ICD-10-CM

## 2024-08-29 DIAGNOSIS — I10 Essential (primary) hypertension: Secondary | ICD-10-CM | POA: Diagnosis not present

## 2024-08-29 DIAGNOSIS — Z7984 Long term (current) use of oral hypoglycemic drugs: Secondary | ICD-10-CM | POA: Diagnosis not present

## 2024-08-29 DIAGNOSIS — Z7689 Persons encountering health services in other specified circumstances: Secondary | ICD-10-CM | POA: Diagnosis not present

## 2024-08-29 DIAGNOSIS — Z79899 Other long term (current) drug therapy: Secondary | ICD-10-CM | POA: Diagnosis not present

## 2024-08-29 DIAGNOSIS — M51369 Other intervertebral disc degeneration, lumbar region without mention of lumbar back pain or lower extremity pain: Secondary | ICD-10-CM | POA: Diagnosis not present

## 2024-08-29 DIAGNOSIS — E782 Mixed hyperlipidemia: Secondary | ICD-10-CM | POA: Diagnosis not present

## 2024-08-29 DIAGNOSIS — Z87891 Personal history of nicotine dependence: Secondary | ICD-10-CM | POA: Diagnosis not present

## 2024-08-29 DIAGNOSIS — E1165 Type 2 diabetes mellitus with hyperglycemia: Secondary | ICD-10-CM | POA: Diagnosis not present

## 2024-08-29 DIAGNOSIS — Z794 Long term (current) use of insulin: Secondary | ICD-10-CM | POA: Diagnosis not present

## 2024-09-27 ENCOUNTER — Other Ambulatory Visit: Payer: Self-pay | Admitting: "Endocrinology

## 2024-11-12 ENCOUNTER — Encounter: Payer: Self-pay | Admitting: Nurse Practitioner

## 2024-11-12 ENCOUNTER — Ambulatory Visit (INDEPENDENT_AMBULATORY_CARE_PROVIDER_SITE_OTHER): Admitting: Nurse Practitioner

## 2024-11-12 VITALS — BP 132/88 | HR 75 | Ht 62.0 in | Wt 202.8 lb

## 2024-11-12 DIAGNOSIS — E782 Mixed hyperlipidemia: Secondary | ICD-10-CM

## 2024-11-12 DIAGNOSIS — E559 Vitamin D deficiency, unspecified: Secondary | ICD-10-CM

## 2024-11-12 DIAGNOSIS — E1165 Type 2 diabetes mellitus with hyperglycemia: Secondary | ICD-10-CM | POA: Diagnosis not present

## 2024-11-12 DIAGNOSIS — I10 Essential (primary) hypertension: Secondary | ICD-10-CM | POA: Diagnosis not present

## 2024-11-12 DIAGNOSIS — Z794 Long term (current) use of insulin: Secondary | ICD-10-CM | POA: Diagnosis not present

## 2024-11-12 DIAGNOSIS — Z7984 Long term (current) use of oral hypoglycemic drugs: Secondary | ICD-10-CM | POA: Diagnosis not present

## 2024-11-12 MED ORDER — METFORMIN HCL 500 MG PO TABS: 1000.0000 mg | ORAL_TABLET | Freq: Two times a day (BID) | ORAL | 1 refills | Status: AC

## 2024-11-12 MED ORDER — HUMALOG MIX 75/25 (75-25) 100 UNIT/ML ~~LOC~~ SUSP: 25.0000 [IU] | Freq: Two times a day (BID) | SUBCUTANEOUS | 11 refills | Status: AC

## 2024-11-12 NOTE — Progress Notes (Signed)
 11/12/2024   Endocrinology follow-up note   Subjective:    Patient ID: Patrick Taylor, male    DOB: 1951/11/21,    Past Medical History:  Diagnosis Date   Anemia    Asthma    Carpal tunnel syndrome    Chronic back pain    Colon polyps    COPD (chronic obstructive pulmonary disease) (HCC)    DM (diabetes mellitus) (HCC)    HTN (hypertension)    Hyperlipidemia    Past Surgical History:  Procedure Laterality Date   ABDOMINAL HERNIA REPAIR     BIOPSY  05/19/2023   Procedure: BIOPSY;  Surgeon: Shaaron Lamar HERO, MD;  Location: [facility];  Service: Endoscopy;;   CATARACT EXTRACTION W/PHACO Right 10/02/2021   Procedure: CATARACT EXTRACTION PHACO AND INTRAOCULAR LENS PLACEMENT (IOC);  Surgeon: Harrie Agent, MD;  Location: [facility];  Service: Ophthalmology;  Laterality: Right;  CDE: 4.57    CATARACT EXTRACTION W/PHACO Left 10/29/2021   Procedure: CATARACT EXTRACTION PHACO AND INTRAOCULAR LENS PLACEMENT LEFT EYE;  Surgeon: Harrie Agent, MD;  Location: [facility];  Service: Ophthalmology;  Laterality: Left;  CDE 8.51   COLONOSCOPY  01/24/09   rectal polyp/adenomatous poylps/tubulovillous   COLONOSCOPY  03/02/2012   Procedure: COLONOSCOPY;  Surgeon: Lamar HERO Shaaron, MD;  Location: [facility];  Service: Endoscopy;  Laterality: N/A;  11:00   COLONOSCOPY WITH PROPOFOL  N/A 09/10/2021   Procedure: COLONOSCOPY WITH PROPOFOL ;  Surgeon: Shaaron Lamar HERO, MD;  [facility]: [facility];  Service: Endoscopy;  Laterality: N/A;  8:45am   ESOPHAGOGASTRODUODENOSCOPY (EGD) WITH PROPOFOL  N/A 05/19/2023   Procedure: ESOPHAGOGASTRODUODENOSCOPY (EGD) WITH PROPOFOL ;  Surgeon: Shaaron Lamar HERO, MD;  Location: [facility];  Service: Endoscopy;  Laterality: N/A;  915am, asa 3   POLYPECTOMY  09/10/2021   Procedure: POLYPECTOMY;  Surgeon: Shaaron Lamar HERO, MD;  Location: [facility];  Service: Endoscopy;;   Social History   Socioeconomic History   Marital status: Married    Spouse name: Not on file   Number  of children: Not on file   Years of education: Not on file   Highest education level: Not on file  Occupational History   Occupation: works at [employer]  Tobacco Use   Smoking status: Former    Current packs/day: 0.00    Average packs/day: 1 pack/day for 20.0 years (20.0 ttl pk-yrs)    Types: Cigarettes    Start date: 02/18/1979    Quit date: 02/18/1999    Years since quitting: 25.7   Smokeless tobacco: Never  Vaping Use   Vaping status: Never Used  Substance and Sexual Activity   Alcohol use: No    Comment: no heavy use in past   Drug use: No   Sexual activity: Not Currently  Other Topics Concern   Not on file  Social History Narrative   Not on file   Social Drivers of Health   Financial Resource Strain: Not on file  Food Insecurity: Not on file  Transportation Needs: Not on file  Physical Activity: Not on file  Stress: Not on file  Social Connections: Not on file   Outpatient Encounter Medications as of 11/12/2024  Medication Sig   Accu-Chek Softclix Lancets lancets Use to check blood glucose 3 times daily as instructed   albuterol (PROVENTIL) 2 MG tablet Take 2 mg by mouth 3 (three) times daily.   albuterol (VENTOLIN HFA) 108 (90 Base) MCG/ACT inhaler Inhale 1-2 puffs into the lungs every 6 (six) hours as needed.  aspirin 81 MG tablet Take 81 mg by mouth once a week.   BD PEN NEEDLE MINI ULTRAFINE 31G X 5 MM MISC USE 1 PEN NEEDLE TWICE DAILY AS NEEDED   blood glucose meter kit and supplies KIT Dispense based on patient and insurance preference. Use 3 times daily as directed (before breakfast, before supper, before bed). Dx e11.9   Blood Glucose Monitoring Suppl (ACCU-CHEK GUIDE ME) w/Device KIT Use to check blood glucose three times daily as instructed   Budeson-Glycopyrrol-Formoterol (BREZTRI AEROSPHERE) 160-9-4.8 MCG/ACT AERO Inhale into the lungs.   celecoxib (CELEBREX) 200 MG capsule Take 200 mg by mouth 2 (two) times daily as needed.   enalapril  (VASOTEC ) 20 MG  tablet Take 1 tablet (20 mg total) by mouth daily.   furosemide (LASIX) 20 MG tablet Take 20 mg by mouth daily.    gabapentin (NEURONTIN) 300 MG capsule Take 300 mg by mouth 3 (three) times daily. 5 CAPS daily   glucose blood (ACCU-CHEK GUIDE TEST) test strip Use to check blood glucose three times daily as instructed   HYDROcodone -acetaminophen  (NORCO) 10-325 MG tablet Take 1 tablet by mouth 4 (four) times daily as needed.   meclizine (ANTIVERT) 25 MG tablet Take 25 mg by mouth 3 (three) times daily as needed.   methylPREDNISolone  (MEDROL  DOSEPAK) 4 MG TBPK tablet Take by mouth.   pravastatin  (PRAVACHOL ) 40 MG tablet Take 1 tablet by mouth once daily   [DISCONTINUED] insulin  lispro protamine-lispro (HUMALOG  MIX 75/25) (75-25) 100 UNIT/ML SUSP injection Inject 0.25 mLs (25 Units total) into the skin 2 (two) times daily with a meal. INJECT 25 UNITS EACH MORNING WITH BREAKFAST AND 20 UNITS EACH EVENING AT SUPPER WHEN GLUCOSE IS ABOVE 90.   [DISCONTINUED] metFORMIN  (GLUCOPHAGE ) 500 MG tablet Take 1 tablet (500 mg total) by mouth 2 (two) times daily with a meal. (Patient taking differently: Take 500 mg by mouth 2 (two) times daily with a meal. Patient reports that per his PCP he is now taking two 500 mg twice a day.)   insulin  lispro protamine-lispro (HUMALOG  MIX 75/25) (75-25) 100 UNIT/ML SUSP injection Inject 0.25 mLs (25 Units total) into the skin 2 (two) times daily with a meal. INJECT 25 UNITS EACH MORNING WITH BREAKFAST AND 20 UNITS EACH EVENING AT SUPPER WHEN GLUCOSE IS ABOVE 90.   metFORMIN  (GLUCOPHAGE ) 500 MG tablet Take 2 tablets (1,000 mg total) by mouth 2 (two) times daily with a meal.   [DISCONTINUED] Continuous Glucose Sensor (FREESTYLE LIBRE 3 SENSOR) MISC Change sensor every 14 days (Patient not taking: Reported on 11/12/2024)   No facility-administered encounter medications on file as of 11/12/2024.   ALLERGIES: No Known Allergies VACCINATION STATUS: Immunization History  Administered  Date(s) Administered   Moderna Sars-Covid-2 Vaccination 02/29/2020, 04/02/2020    Diabetes He presents for his follow-up diabetic visit. He has type 2 diabetes mellitus. Onset time: He was diagnosed at approximate age of 41 years. His disease course has been stable. There are no hypoglycemic associated symptoms. Pertinent negatives for hypoglycemia include no confusion, pallor or seizures. Pertinent negatives for diabetes include no fatigue, no polydipsia, no polyphagia, no polyuria and no weakness. There are no hypoglycemic complications. Symptoms are stable. Diabetic complications include nephropathy. Risk factors for coronary artery disease include dyslipidemia, diabetes mellitus, hypertension, sedentary lifestyle, male sex, obesity, tobacco exposure and family history. Current diabetic treatment includes insulin  injections and oral agent (monotherapy). He is compliant with treatment most of the time. His weight is decreasing steadily. He is following a generally  healthy diet. When asked about meal planning, he reported none. He has not had a previous visit with a dietitian. He rarely participates in exercise. His home blood glucose trend is fluctuating minimally. His breakfast blood glucose range is generally 90-110 mg/dl. His lunch blood glucose range is generally 110-130 mg/dl. His dinner blood glucose range is generally 110-130 mg/dl. (He presents today, accompanied by his wife, with his logs showing at target glycemic profile overall.  His most recent A1c, checked by his PCP on 10/20 was 8.1%, improving from last visit of 8.3 %.  He denies any hypoglycemia- but does note he has skipped his insulin  from time to time when glucose was at goal.  He notes he sometimes has a hard time affording his insulin .  His new PCP did increase his Metformin  to 1000 mg twice daily.) An ACE inhibitor/angiotensin II receptor blocker is being taken. He does not see a podiatrist.Eye exam is current.    Review of  systems  Constitutional: + Minimally fluctuating body weight,  current Body mass index is 37.09 kg/m. , no fatigue, no subjective hyperthermia, no subjective hypothermia Eyes: no blurry vision, no xerophthalmia ENT: no sore throat, no nodules palpated in throat, no dysphagia/odynophagia, no hoarseness Cardiovascular: no chest pain, no shortness of breath, no palpitations, no leg swelling Respiratory: no cough, no shortness of breath Gastrointestinal: no nausea/vomiting/diarrhea Musculoskeletal: no muscle/joint aches Skin: no rashes, no hyperemia Neurological: no tremors, no numbness, no tingling, no dizziness Psychiatric: no depression, no anxiety   Objective:    BP 132/88 (BP Location: Left Arm, Patient Position: Sitting, Cuff Size: Large)   Pulse 75   Ht 5' 2 (1.575 m)   Wt 202 lb 12.8 oz (92 kg)   BMI 37.09 kg/m   Wt Readings from Last 3 Encounters:  11/12/24 202 lb 12.8 oz (92 kg)  07/11/24 207 lb 12.8 oz (94.3 kg)  03/12/24 201 lb (91.2 kg)    BP Readings from Last 3 Encounters:  11/12/24 132/88  07/11/24 122/80  03/12/24 135/84     Physical Exam- Limited  Constitutional:  Body mass index is 37.09 kg/m. , not in acute distress, normal state of mind Eyes:  EOMI, no exophthalmos Musculoskeletal: no gross deformities, strength intact in all four extremities, no gross restriction of joint movements Skin:  no rashes, no hyperemia Neurological: no tremor with outstretched hands   Diabetic Foot Exam - Simple   Simple Foot Form Visual Inspection See comments: Yes Sensation Testing Intact to touch and monofilament testing bilaterally: Yes Pulse Check Posterior Tibialis and Dorsalis pulse intact bilaterally: Yes Comments Bunion to left foot, dry flaky skin bilaterally, mild onychomycosis bilaterally     Results for orders placed or performed in visit on 07/11/24  Microalbumin / creatinine urine ratio   Collection Time: 02/03/24 12:00 AM  Result Value Ref Range    Microalb Creat Ratio <14   Comprehensive metabolic panel with GFR   Collection Time: 02/03/24 12:00 AM  Result Value Ref Range   eGFR 77   Lipid panel   Collection Time: 02/03/24 12:00 AM  Result Value Ref Range   Triglycerides 68 40 - 160   LDL Cholesterol 83   Hemoglobin A1c   Collection Time: 02/03/24 12:00 AM  Result Value Ref Range   Hemoglobin A1C 7.9   HgB A1c   Collection Time: 07/11/24  8:51 AM  Result Value Ref Range   Hemoglobin A1C 8.3 (A) 4.0 - 5.6 %   HbA1c POC (<> result, manual  entry)     HbA1c, POC (prediabetic range)     HbA1c, POC (controlled diabetic range)    Diabetic Labs (most recent): Lab Results  Component Value Date   HGBA1C 8.3 (A) 07/11/2024   HGBA1C 7.9 02/03/2024   HGBA1C 9.2 (A) 12/12/2023   MICROALBUR 10 mg/L 08/11/2023   MICROALBUR 10 06/17/2021   MICROALBUR <3.0 06/05/2020    Lipid Panel     Component Value Date/Time   CHOL 174 06/10/2021 0824   TRIG 68 02/03/2024 0000   HDL 41 06/10/2021 0824   CHOLHDL 4.2 06/10/2021 0824   LDLCALC 83 02/03/2024 0000   LDLCALC 101 (H) 06/10/2021 0824      Latest Ref Rng & Units 05/18/2023    2:33 PM 10/18/2022    7:48 AM 02/15/2022    8:15 AM  CMP  Glucose 70 - 99 mg/dL 893  64  66   BUN 8 - 23 mg/dL 15  15  14    Creatinine 0.61 - 1.24 mg/dL 8.98  8.96  9.02   Sodium 135 - 145 mmol/L 137  145  143   Potassium 3.5 - 5.1 mmol/L 4.5  4.5  4.2   Chloride 98 - 111 mmol/L 99  104  103   CO2 22 - 32 mmol/L 28  25  27    Calcium 8.9 - 10.3 mg/dL 9.4  89.8  89.9   Total Protein 6.0 - 8.5 g/dL  7.1  6.9   Total Bilirubin 0.0 - 1.2 mg/dL  0.3  0.3   Alkaline Phos 44 - 121 IU/L  64  76   AST 0 - 40 IU/L  23  23   ALT 0 - 44 IU/L  7  12      Assessment & Plan:   1) Uncontrolled type 2 diabetes mellitus without complication, with long-term current use of insulin  (HCC)  He presents today, accompanied by his wife, with his logs showing at target glycemic profile overall.  His most recent A1c, checked  by his PCP on 10/20 was 8.1%, improving from last visit of 8.3 %.  He denies any hypoglycemia- but does note he has skipped his insulin  from time to time when glucose was at goal.  He notes he sometimes has a hard time affording his insulin .  His new PCP did increase his Metformin  to 1000 mg twice daily.  Glucose logs and insulin  administration records pertaining to this visit,  to be scanned into patient's records.  Recent labs reviewed.  - Patient remains at a high risk for more acute and chronic complications of diabetes which include CAD, CVA, CKD, retinopathy, and neuropathy. These are all discussed in detail with the patient.  - Nutritional counseling repeated/built upon at each appointment.  - The patient admits there is a room for improvement in their diet and drink choices. -  Suggestion is made for the patient to avoid simple carbohydrates from their diet including Cakes, Sweet Desserts / Pastries, Ice Cream, Soda (diet and regular), Sweet Tea, Candies, Chips, Cookies, Sweet Pastries, Store Bought Juices, Alcohol in Excess of 1-2 drinks a day, Artificial Sweeteners, Coffee Creamer, and Sugar-free Products. This will help patient to have stable blood glucose profile and potentially avoid unintended weight gain.   - I encouraged the patient to switch to unprocessed or minimally processed complex starch and increased protein intake (animal or plant source), fruits, and vegetables.   - Patient is advised to stick to a routine mealtimes to eat 3 meals a day  and avoid unnecessary snacks (to snack only to correct hypoglycemia).  - I have approached patient with the following individualized plan to manage diabetes and patient agrees.  -He has responded very well to premixed insulin  using Novolin  70/30.   -He is advised to continue his 75/25 25 units at breakfast and 25 units at supper (if glucose is above 90 and he is eating).   He is advised to continue his Metformin  1000 mg po twice daily  after meals.  Will potentially decrease this back if kidney function starts to decline.  -He is encouraged to monitor glucose at least 3 times daily, before injecting insulin  (at breakfast and supper) and before bed, and call the clinic if he has readings less than 70 or greater than 200 for 3 days out of the week.  He cannot afford the copay for CGM thus prefers to stay with traditional fingersticks.  - Patient specific target  for A1c; LDL, HDL, Triglycerides, and  Waist Circumference were discussed in detail.  2) BP/HTN:  His blood pressure is controlled to target.  He is advised to continue meds as prescribed by PCP.      3) Lipids/HPL:  His most recent lipid panel from 10/01/24 shows controlled LDL at 67.  He is advised to continue his Pravastatin  40 mg po daily at bedtime.  Side effects and precautions discussed with patient.   4)  Weight/Diet:  His Body mass index is 37.09 kg/m.--clearly complicating his diabetes care.  He is a candidate for modest weight loss.  CDE consult in progress, exercise, and carbohydrates information provided.  5) Chronic Care/Health Maintenance: -Patient is on ACEI and Statin medications and encouraged to continue to follow up with Ophthalmology, Podiatrist at least yearly or according to recommendations, and advised to stay away from smoking. I have recommended yearly flu vaccine and pneumonia vaccination at least every 5 years; moderate intensity exercise for up to 150 minutes weekly; and  sleep for at least 7 hours a day.  6) Vitamin D  deficiency -His recent vitamin D  level was 66.2 on 10/18/22.  He has been taking OTC Vitamin D3 5000 units daily as maintenance dose.     I advised patient to maintain close follow up with his PCP for primary care needs.     I spent  37  minutes in the care of the patient today including review of labs from CMP, Lipids, Thyroid  Function, Hematology (current and previous including abstractions from other facilities);  face-to-face time discussing  his blood glucose readings/logs, discussing hypoglycemia and hyperglycemia episodes and symptoms, medications doses, his options of short and long term treatment based on the latest standards of care / guidelines;  discussion about incorporating lifestyle medicine;  and documenting the encounter. Risk reduction counseling performed per USPSTF guidelines to reduce obesity and cardiovascular risk factors.     Please refer to Patient Instructions for Blood Glucose Monitoring and Insulin /Medications Dosing Guide  in media tab for additional information. Please  also refer to  Patient Self Inventory in the Media  tab for reviewed elements of pertinent patient history.  Patrick Taylor participated in the discussions, expressed understanding, and voiced agreement with the above plans.  All questions were answered to his satisfaction. he is encouraged to contact clinic should he have any questions or concerns prior to his return visit.   Follow up plan: Return in about 4 months (around 03/13/2025) for Diabetes F/U with A1c in office, No previsit labs, Bring meter and logs.  Ferdinand Revoir  [facility] [address] [place], [place] [address] Phone: [phone] Fax: [phone]  11/12/2024, 8:52 AM

## 2024-11-29 ENCOUNTER — Other Ambulatory Visit: Payer: Self-pay | Admitting: Nurse Practitioner

## 2024-11-29 DIAGNOSIS — E1165 Type 2 diabetes mellitus with hyperglycemia: Secondary | ICD-10-CM

## 2024-12-24 ENCOUNTER — Telehealth: Payer: Self-pay | Admitting: *Deleted

## 2024-12-24 NOTE — Telephone Encounter (Signed)
 Patient has left a message asking if we had received his insulin  from [facility]. At this time we have received nothing from [facility].  We will call [facility] and follow up on this.

## 2024-12-25 ENCOUNTER — Other Ambulatory Visit: Payer: Self-pay | Admitting: Nurse Practitioner

## 2024-12-25 ENCOUNTER — Telehealth: Payer: Self-pay | Admitting: Nurse Practitioner

## 2024-12-25 MED ORDER — NOVOLOG MIX 70/30 FLEXPEN (70-30) 100 UNIT/ML ~~LOC~~ SUPN: 25.0000 [IU] | PEN_INJECTOR | Freq: Two times a day (BID) | SUBCUTANEOUS | 0 refills | Status: AC

## 2024-12-25 NOTE — Telephone Encounter (Signed)
 Pt needs RX of Novolog  sent to [facility] to use with voucher from [facility], stated it needed to be called in as a 30 day supply

## 2024-12-25 NOTE — Telephone Encounter (Signed)
 I spoke with Lupita at [employer] [facility] regarding the patients Novolog  70/30 application. The patient has completed the required documents and is almost out of medication. Lupita confirmed that shipping will be processed soon. The approval was finalized on 12/14/24, and delivery can take 10-14 business days. Insulin  and needles will be provided; however, the patient will need to submit another application before the next refill date in March 2026 for additional needles. The medication approval is valid through 12/12/2025. If the patient needs medication sooner, they can request a one-time Immediate Supply Voucher by calling [phone]. I will notify the patient, and if they choose to use the voucher, they will need to contact that number and inform us  so we can send the prescription to their local pharmacy

## 2024-12-25 NOTE — Telephone Encounter (Signed)
 done

## 2025-01-09 ENCOUNTER — Telehealth: Payer: Self-pay | Admitting: Nurse Practitioner

## 2025-01-09 NOTE — Telephone Encounter (Signed)
 Called and let pt know Novolog  and pen needles were ready for pick up

## 2025-01-09 NOTE — Telephone Encounter (Signed)
 Pt picked up PAP of Novolog  and pen needles

## 2025-01-10 LAB — LAB REPORT - SCANNED
A1c: 7.9
Albumin, Urine POC: <3
Creatinine, POC: 15 mg/dL
EGFR: 71
Microalb Creat Ratio: <20

## 2025-03-13 ENCOUNTER — Ambulatory Visit: Admitting: Nurse Practitioner
